# Patient Record
Sex: Female | Born: 1940 | Race: White | Hispanic: No | Marital: Married | Smoking: Never smoker
Health system: Southern US, Community
[De-identification: ages and names within clinical notes are randomized; demographics above are authoritative.]

## PROBLEM LIST (undated history)

## (undated) DIAGNOSIS — T7840XA Allergy, unspecified, initial encounter: Secondary | ICD-10-CM

## (undated) DIAGNOSIS — F419 Anxiety disorder, unspecified: Secondary | ICD-10-CM

## (undated) DIAGNOSIS — I10 Essential (primary) hypertension: Secondary | ICD-10-CM

## (undated) DIAGNOSIS — D649 Anemia, unspecified: Secondary | ICD-10-CM

## (undated) DIAGNOSIS — M1990 Unspecified osteoarthritis, unspecified site: Secondary | ICD-10-CM

## (undated) DIAGNOSIS — K219 Gastro-esophageal reflux disease without esophagitis: Secondary | ICD-10-CM

## (undated) DIAGNOSIS — H269 Unspecified cataract: Secondary | ICD-10-CM

## (undated) DIAGNOSIS — F329 Major depressive disorder, single episode, unspecified: Secondary | ICD-10-CM

## (undated) DIAGNOSIS — F32A Depression, unspecified: Secondary | ICD-10-CM

## (undated) HISTORY — DX: Unspecified cataract: H26.9

## (undated) HISTORY — PX: MOUTH SURGERY: SHX715

## (undated) HISTORY — PX: COLONOSCOPY: SHX174

## (undated) HISTORY — PX: TONSILLECTOMY: SUR1361

## (undated) HISTORY — DX: Anxiety disorder, unspecified: F41.9

## (undated) HISTORY — DX: Major depressive disorder, single episode, unspecified: F32.9

## (undated) HISTORY — DX: Unspecified osteoarthritis, unspecified site: M19.90

## (undated) HISTORY — DX: Anemia, unspecified: D64.9

## (undated) HISTORY — PX: TUBAL LIGATION: SHX77

## (undated) HISTORY — DX: Gastro-esophageal reflux disease without esophagitis: K21.9

## (undated) HISTORY — DX: Allergy, unspecified, initial encounter: T78.40XA

## (undated) HISTORY — DX: Essential (primary) hypertension: I10

## (undated) HISTORY — DX: Depression, unspecified: F32.A

---

## 1998-02-27 ENCOUNTER — Other Ambulatory Visit: Admission: RE | Admit: 1998-02-27 | Discharge: 1998-02-27 | Payer: Self-pay | Admitting: Obstetrics and Gynecology

## 1999-06-25 ENCOUNTER — Other Ambulatory Visit: Admission: RE | Admit: 1999-06-25 | Discharge: 1999-06-25 | Payer: Self-pay | Admitting: Obstetrics and Gynecology

## 2000-06-30 ENCOUNTER — Other Ambulatory Visit: Admission: RE | Admit: 2000-06-30 | Discharge: 2000-06-30 | Payer: Self-pay | Admitting: Obstetrics and Gynecology

## 2001-07-06 ENCOUNTER — Other Ambulatory Visit: Admission: RE | Admit: 2001-07-06 | Discharge: 2001-07-06 | Payer: Self-pay | Admitting: Obstetrics and Gynecology

## 2002-08-27 ENCOUNTER — Other Ambulatory Visit: Admission: RE | Admit: 2002-08-27 | Discharge: 2002-08-27 | Payer: Self-pay | Admitting: Obstetrics and Gynecology

## 2004-06-06 ENCOUNTER — Other Ambulatory Visit: Admission: RE | Admit: 2004-06-06 | Discharge: 2004-06-06 | Payer: Self-pay | Admitting: *Deleted

## 2005-08-09 ENCOUNTER — Other Ambulatory Visit: Admission: RE | Admit: 2005-08-09 | Discharge: 2005-08-09 | Payer: Self-pay | Admitting: Obstetrics and Gynecology

## 2006-08-13 ENCOUNTER — Other Ambulatory Visit: Admission: RE | Admit: 2006-08-13 | Discharge: 2006-08-13 | Payer: Self-pay | Admitting: Obstetrics and Gynecology

## 2007-04-08 ENCOUNTER — Ambulatory Visit: Payer: Self-pay | Admitting: Gastroenterology

## 2007-04-21 ENCOUNTER — Ambulatory Visit: Payer: Self-pay | Admitting: Gastroenterology

## 2007-04-21 ENCOUNTER — Encounter: Payer: Self-pay | Admitting: Gastroenterology

## 2008-01-25 ENCOUNTER — Other Ambulatory Visit: Admission: RE | Admit: 2008-01-25 | Discharge: 2008-01-25 | Payer: Self-pay | Admitting: Obstetrics and Gynecology

## 2011-09-03 DIAGNOSIS — R030 Elevated blood-pressure reading, without diagnosis of hypertension: Secondary | ICD-10-CM | POA: Diagnosis not present

## 2011-09-03 DIAGNOSIS — Z23 Encounter for immunization: Secondary | ICD-10-CM | POA: Diagnosis not present

## 2011-09-03 DIAGNOSIS — M159 Polyosteoarthritis, unspecified: Secondary | ICD-10-CM | POA: Diagnosis not present

## 2011-09-03 DIAGNOSIS — Z79899 Other long term (current) drug therapy: Secondary | ICD-10-CM | POA: Diagnosis not present

## 2011-10-01 DIAGNOSIS — I10 Essential (primary) hypertension: Secondary | ICD-10-CM | POA: Diagnosis not present

## 2011-11-21 DIAGNOSIS — J329 Chronic sinusitis, unspecified: Secondary | ICD-10-CM | POA: Diagnosis not present

## 2011-11-21 DIAGNOSIS — R0989 Other specified symptoms and signs involving the circulatory and respiratory systems: Secondary | ICD-10-CM | POA: Diagnosis not present

## 2011-11-21 DIAGNOSIS — W57XXXA Bitten or stung by nonvenomous insect and other nonvenomous arthropods, initial encounter: Secondary | ICD-10-CM | POA: Diagnosis not present

## 2011-11-21 DIAGNOSIS — R0609 Other forms of dyspnea: Secondary | ICD-10-CM | POA: Diagnosis not present

## 2011-11-21 DIAGNOSIS — T148 Other injury of unspecified body region: Secondary | ICD-10-CM | POA: Diagnosis not present

## 2011-11-21 DIAGNOSIS — J309 Allergic rhinitis, unspecified: Secondary | ICD-10-CM | POA: Diagnosis not present

## 2011-12-02 DIAGNOSIS — I10 Essential (primary) hypertension: Secondary | ICD-10-CM | POA: Diagnosis not present

## 2012-01-23 DIAGNOSIS — Z1231 Encounter for screening mammogram for malignant neoplasm of breast: Secondary | ICD-10-CM | POA: Diagnosis not present

## 2012-01-23 DIAGNOSIS — M949 Disorder of cartilage, unspecified: Secondary | ICD-10-CM | POA: Diagnosis not present

## 2012-01-23 DIAGNOSIS — M899 Disorder of bone, unspecified: Secondary | ICD-10-CM | POA: Diagnosis not present

## 2012-04-22 DIAGNOSIS — Z23 Encounter for immunization: Secondary | ICD-10-CM | POA: Diagnosis not present

## 2012-06-25 DIAGNOSIS — M159 Polyosteoarthritis, unspecified: Secondary | ICD-10-CM | POA: Diagnosis not present

## 2012-06-25 DIAGNOSIS — I10 Essential (primary) hypertension: Secondary | ICD-10-CM | POA: Diagnosis not present

## 2012-06-25 DIAGNOSIS — B009 Herpesviral infection, unspecified: Secondary | ICD-10-CM | POA: Diagnosis not present

## 2012-06-25 DIAGNOSIS — K219 Gastro-esophageal reflux disease without esophagitis: Secondary | ICD-10-CM | POA: Diagnosis not present

## 2012-09-07 DIAGNOSIS — E782 Mixed hyperlipidemia: Secondary | ICD-10-CM | POA: Diagnosis not present

## 2012-09-07 DIAGNOSIS — Z79899 Other long term (current) drug therapy: Secondary | ICD-10-CM | POA: Diagnosis not present

## 2012-09-07 DIAGNOSIS — E559 Vitamin D deficiency, unspecified: Secondary | ICD-10-CM | POA: Diagnosis not present

## 2013-02-22 DIAGNOSIS — M25559 Pain in unspecified hip: Secondary | ICD-10-CM | POA: Diagnosis not present

## 2013-02-22 DIAGNOSIS — Z23 Encounter for immunization: Secondary | ICD-10-CM | POA: Diagnosis not present

## 2013-02-22 DIAGNOSIS — M25569 Pain in unspecified knee: Secondary | ICD-10-CM | POA: Diagnosis not present

## 2013-08-18 DIAGNOSIS — M25559 Pain in unspecified hip: Secondary | ICD-10-CM | POA: Diagnosis not present

## 2013-08-18 DIAGNOSIS — Z23 Encounter for immunization: Secondary | ICD-10-CM | POA: Diagnosis not present

## 2013-08-18 DIAGNOSIS — I10 Essential (primary) hypertension: Secondary | ICD-10-CM | POA: Diagnosis not present

## 2014-01-24 DIAGNOSIS — Z780 Asymptomatic menopausal state: Secondary | ICD-10-CM | POA: Diagnosis not present

## 2014-01-24 DIAGNOSIS — Z1231 Encounter for screening mammogram for malignant neoplasm of breast: Secondary | ICD-10-CM | POA: Diagnosis not present

## 2014-01-24 DIAGNOSIS — Z8262 Family history of osteoporosis: Secondary | ICD-10-CM | POA: Diagnosis not present

## 2014-03-31 DIAGNOSIS — Z23 Encounter for immunization: Secondary | ICD-10-CM | POA: Diagnosis not present

## 2014-10-03 DIAGNOSIS — M159 Polyosteoarthritis, unspecified: Secondary | ICD-10-CM | POA: Diagnosis not present

## 2014-10-03 DIAGNOSIS — Z79899 Other long term (current) drug therapy: Secondary | ICD-10-CM | POA: Diagnosis not present

## 2014-10-03 DIAGNOSIS — K219 Gastro-esophageal reflux disease without esophagitis: Secondary | ICD-10-CM | POA: Diagnosis not present

## 2014-10-03 DIAGNOSIS — B001 Herpesviral vesicular dermatitis: Secondary | ICD-10-CM | POA: Diagnosis not present

## 2014-10-03 DIAGNOSIS — I10 Essential (primary) hypertension: Secondary | ICD-10-CM | POA: Diagnosis not present

## 2014-10-03 DIAGNOSIS — Z0000 Encounter for general adult medical examination without abnormal findings: Secondary | ICD-10-CM | POA: Diagnosis not present

## 2014-10-13 DIAGNOSIS — R0602 Shortness of breath: Secondary | ICD-10-CM | POA: Diagnosis not present

## 2014-12-07 DIAGNOSIS — J40 Bronchitis, not specified as acute or chronic: Secondary | ICD-10-CM | POA: Diagnosis not present

## 2014-12-22 DIAGNOSIS — H109 Unspecified conjunctivitis: Secondary | ICD-10-CM | POA: Diagnosis not present

## 2015-04-25 DIAGNOSIS — B001 Herpesviral vesicular dermatitis: Secondary | ICD-10-CM | POA: Diagnosis not present

## 2015-04-25 DIAGNOSIS — K219 Gastro-esophageal reflux disease without esophagitis: Secondary | ICD-10-CM | POA: Diagnosis not present

## 2015-04-25 DIAGNOSIS — Z0000 Encounter for general adult medical examination without abnormal findings: Secondary | ICD-10-CM | POA: Diagnosis not present

## 2015-04-25 DIAGNOSIS — M159 Polyosteoarthritis, unspecified: Secondary | ICD-10-CM | POA: Diagnosis not present

## 2015-04-25 DIAGNOSIS — I10 Essential (primary) hypertension: Secondary | ICD-10-CM | POA: Diagnosis not present

## 2015-05-02 DIAGNOSIS — Z1231 Encounter for screening mammogram for malignant neoplasm of breast: Secondary | ICD-10-CM | POA: Diagnosis not present

## 2015-06-21 DIAGNOSIS — L821 Other seborrheic keratosis: Secondary | ICD-10-CM | POA: Diagnosis not present

## 2015-06-21 DIAGNOSIS — L570 Actinic keratosis: Secondary | ICD-10-CM | POA: Diagnosis not present

## 2015-06-21 DIAGNOSIS — L300 Nummular dermatitis: Secondary | ICD-10-CM | POA: Diagnosis not present

## 2016-01-24 ENCOUNTER — Other Ambulatory Visit: Payer: Self-pay

## 2016-04-05 DIAGNOSIS — Z0000 Encounter for general adult medical examination without abnormal findings: Secondary | ICD-10-CM | POA: Diagnosis not present

## 2016-04-05 DIAGNOSIS — M159 Polyosteoarthritis, unspecified: Secondary | ICD-10-CM | POA: Diagnosis not present

## 2016-04-05 DIAGNOSIS — Z79899 Other long term (current) drug therapy: Secondary | ICD-10-CM | POA: Diagnosis not present

## 2016-04-05 DIAGNOSIS — M25571 Pain in right ankle and joints of right foot: Secondary | ICD-10-CM | POA: Diagnosis not present

## 2016-04-05 DIAGNOSIS — K219 Gastro-esophageal reflux disease without esophagitis: Secondary | ICD-10-CM | POA: Diagnosis not present

## 2016-04-05 DIAGNOSIS — Z23 Encounter for immunization: Secondary | ICD-10-CM | POA: Diagnosis not present

## 2016-04-05 DIAGNOSIS — I10 Essential (primary) hypertension: Secondary | ICD-10-CM | POA: Diagnosis not present

## 2016-09-02 DIAGNOSIS — M85852 Other specified disorders of bone density and structure, left thigh: Secondary | ICD-10-CM | POA: Diagnosis not present

## 2016-09-02 DIAGNOSIS — Z1231 Encounter for screening mammogram for malignant neoplasm of breast: Secondary | ICD-10-CM | POA: Diagnosis not present

## 2017-03-06 DIAGNOSIS — K219 Gastro-esophageal reflux disease without esophagitis: Secondary | ICD-10-CM | POA: Diagnosis not present

## 2017-03-06 DIAGNOSIS — I10 Essential (primary) hypertension: Secondary | ICD-10-CM | POA: Diagnosis not present

## 2017-03-06 DIAGNOSIS — Z1339 Encounter for screening examination for other mental health and behavioral disorders: Secondary | ICD-10-CM | POA: Diagnosis not present

## 2017-03-06 DIAGNOSIS — M159 Polyosteoarthritis, unspecified: Secondary | ICD-10-CM | POA: Diagnosis not present

## 2017-03-06 DIAGNOSIS — Z79899 Other long term (current) drug therapy: Secondary | ICD-10-CM | POA: Diagnosis not present

## 2017-04-30 ENCOUNTER — Encounter: Payer: Self-pay | Admitting: Gastroenterology

## 2017-07-18 ENCOUNTER — Encounter: Payer: Self-pay | Admitting: Gastroenterology

## 2017-09-08 ENCOUNTER — Other Ambulatory Visit: Payer: Self-pay

## 2017-09-08 ENCOUNTER — Ambulatory Visit (AMBULATORY_SURGERY_CENTER): Payer: Self-pay | Admitting: *Deleted

## 2017-09-08 VITALS — Ht 60.0 in | Wt 168.0 lb

## 2017-09-08 DIAGNOSIS — Z1211 Encounter for screening for malignant neoplasm of colon: Secondary | ICD-10-CM

## 2017-09-08 MED ORDER — NA SULFATE-K SULFATE-MG SULF 17.5-3.13-1.6 GM/177ML PO SOLN: 1.0000 [IU] | Freq: Once | ORAL | 0 refills | Status: AC

## 2017-09-08 NOTE — Progress Notes (Signed)
No egg or soy allergy known to patient  No issues with past sedation with any surgeries  or procedures, no intubation problems  No diet pills per patient No home 02 use per patient  No blood thinners per patient  Pt denies issues with constipation  No A fib or A flutter  EMMI video sent to pt's e mail  

## 2017-09-17 ENCOUNTER — Telehealth: Payer: Self-pay | Admitting: Gastroenterology

## 2017-09-17 NOTE — Telephone Encounter (Signed)
Pt called to inform that she usually eats gluten and lactose free foods because her stomach feels better. She has never been tested for gluten intolerance but would like Dr. Fuller Plan to know about this.

## 2017-09-17 NOTE — Telephone Encounter (Signed)
I advised the patient I will pass this along to Dr. Fuller Plan.  She is scheduled for a colonoscopy on Monday.  She just wanted Dr. Fuller Plan to be aware

## 2017-09-22 ENCOUNTER — Encounter: Payer: Self-pay | Admitting: Gastroenterology

## 2017-09-22 ENCOUNTER — Ambulatory Visit (AMBULATORY_SURGERY_CENTER): Payer: Medicare Other | Admitting: Gastroenterology

## 2017-09-22 ENCOUNTER — Other Ambulatory Visit: Payer: Self-pay

## 2017-09-22 VITALS — BP 110/56 | HR 64 | Temp 96.8°F | Resp 18 | Ht 60.0 in | Wt 168.0 lb

## 2017-09-22 DIAGNOSIS — Z1212 Encounter for screening for malignant neoplasm of rectum: Secondary | ICD-10-CM | POA: Diagnosis not present

## 2017-09-22 DIAGNOSIS — K219 Gastro-esophageal reflux disease without esophagitis: Secondary | ICD-10-CM | POA: Diagnosis not present

## 2017-09-22 DIAGNOSIS — Z1211 Encounter for screening for malignant neoplasm of colon: Secondary | ICD-10-CM

## 2017-09-22 DIAGNOSIS — I10 Essential (primary) hypertension: Secondary | ICD-10-CM | POA: Diagnosis not present

## 2017-09-22 MED ORDER — SODIUM CHLORIDE 0.9 % IV SOLN: 500.0000 mL | Freq: Once | INTRAVENOUS | Status: DC

## 2017-09-22 NOTE — Progress Notes (Signed)
Pt's states no medical or surgical changes since previsit or office visit. 

## 2017-09-22 NOTE — Patient Instructions (Signed)
HANDOUTS GIVEN FOR HEMORRHOIDS AND DIVERTICULOSIS  YOU HAD AN ENDOSCOPIC PROCEDURE TODAY AT [facility]:   Refer to the procedure report that was given to you for any specific questions about what was found during the examination.  If the procedure report does not answer your questions, please call your gastroenterologist to clarify.  If you requested that your care partner not be given the details of your procedure findings, then the procedure report has been included in a sealed envelope for you to review at your convenience later.  YOU SHOULD EXPECT: Some feelings of bloating in the abdomen. Passage of more gas than usual.  Walking can help get rid of the air that was put into your GI tract during the procedure and reduce the bloating. If you had a lower endoscopy (such as a colonoscopy or flexible sigmoidoscopy) you may notice spotting of blood in your stool or on the toilet paper. If you underwent a bowel prep for your procedure, you may not have a normal bowel movement for a few days.  Please Note:  You might notice some irritation and congestion in your nose or some drainage.  This is from the oxygen used during your procedure.  There is no need for concern and it should clear up in a day or so.  SYMPTOMS TO REPORT IMMEDIATELY:   Following lower endoscopy (colonoscopy or flexible sigmoidoscopy):  Excessive amounts of blood in the stool  Significant tenderness or worsening of abdominal pains  Swelling of the abdomen that is new, acute  Fever of 100F or higher  For urgent or emergent issues, a gastroenterologist can be reached at any hour by calling [phone].   DIET:  We do recommend a small meal at first, but then you may proceed to your regular diet.  Drink plenty of fluids but you should avoid alcoholic beverages for 24 hours.  ACTIVITY:  You should plan to take it easy for the rest of today and you should NOT DRIVE or use heavy machinery until tomorrow (because  of the sedation medicines used during the test).    FOLLOW UP: Our staff will call the number listed on your records the next business day following your procedure to check on you and address any questions or concerns that you may have regarding the information given to you following your procedure. If we do not reach you, we will leave a message.  However, if you are feeling well and you are not experiencing any problems, there is no need to return our call.  We will assume that you have returned to your regular daily activities without incident.  If any biopsies were taken you will be contacted by phone or by letter within the next 1-3 weeks.  Please call us at [phone] if you have not heard about the biopsies in 3 weeks.    SIGNATURES/CONFIDENTIALITY: You and/or your care partner have signed paperwork which will be entered into your electronic medical record.  These signatures attest to the fact that that the information above on your After Visit Summary has been reviewed and is understood.  Full responsibility of the confidentiality of this discharge information lies with you and/or your care-partner.

## 2017-09-22 NOTE — Op Note (Signed)
[place] Patient Name: Connie Jarvis Procedure Date: 09/22/2017 11:18 AM MRN: [id number] Endoscopist: Ladene Artist , MD Age: 77 Referring MD:  Date of Birth: Dec 05, 1940 Gender: Female Account #: [id number] Procedure:                Colonoscopy Indications:              Screening for colorectal malignant neoplasm Medicines:                Monitored Anesthesia Care Procedure:                Pre-Anesthesia Assessment:                           - Prior to the procedure, a History and Physical                            was performed, and patient medications and                            allergies were reviewed. The patient's tolerance of                            previous anesthesia was also reviewed. The risks                            and benefits of the procedure and the sedation                            options and risks were discussed with the patient.                            All questions were answered, and informed consent                            was obtained. Prior Anticoagulants: The patient has                            taken no previous anticoagulant or antiplatelet                            agents. ASA Grade Assessment: II - A patient with                            mild systemic disease. After reviewing the risks                            and benefits, the patient was deemed in                            satisfactory condition to undergo the procedure.                           After obtaining informed consent, the colonoscope  was passed under direct vision. Throughout the                            procedure, the patient's blood pressure, pulse, and                            oxygen saturations were monitored continuously. The                            Model PCF-[id number]) scope was introduced                            through the anus and advanced to the the cecum,                            identified by  appendiceal orifice and ileocecal                            valve. The ileocecal valve, appendiceal orifice,                            and rectum were photographed. The quality of the                            bowel preparation was good. The colonoscopy was                            performed without difficulty. The patient tolerated                            the procedure well. Scope In: 11:31:50 AM Scope Out: 11:43:38 AM Scope Withdrawal Time: 0 hours 8 minutes 42 seconds  Total Procedure Duration: 0 hours 11 minutes 48 seconds  Findings:                 The perianal and digital rectal examinations were                            normal.                           A few medium-mouthed diverticula were found in the                            left colon. There was no evidence of diverticular                            bleeding.                           Internal hemorrhoids were found during                            retroflexion. The hemorrhoids were mild and Grade I                            (  internal hemorrhoids that do not prolapse).                           The exam was otherwise without abnormality on                            direct and retroflexion views. Complications:            No immediate complications. Estimated blood loss:                            None. Estimated Blood Loss:     Estimated blood loss: none. Impression:               - Mild diverticulosis in the left colon. There was                            no evidence of diverticular bleeding.                           - Internal hemorrhoids.                           - The examination was otherwise normal on direct                            and retroflexion views.                           - No specimens collected. Recommendation:           - Patient has a contact number available for                            emergencies. The signs and symptoms of potential                            delayed complications  were discussed with the                            patient. Return to normal activities tomorrow.                            Written discharge instructions were provided to the                            patient.                           - Resume previous diet.                           - Continue present medications.                           - No repeat colonoscopy due to age. Ladene Artist, MD 09/22/2017 11:47:23 AM This report has been signed electronically.

## 2017-09-22 NOTE — Progress Notes (Signed)
Report to PACU, RN, vss, BBS= Clear.  

## 2017-09-23 ENCOUNTER — Telehealth: Payer: Self-pay | Admitting: *Deleted

## 2017-09-23 NOTE — Telephone Encounter (Signed)
  Follow up Call-  Call back number 09/22/2017  Post procedure Call Back phone  # [phone]  Permission to leave phone message Yes  Some recent data might be hidden     Patient questions:  Do you have a fever, pain , or abdominal swelling? No. Pain Score  0 *  Have you tolerated food without any problems? Yes.    Have you been able to return to your normal activities? Yes.    Do you have any questions about your discharge instructions: Diet   No. Medications  No. Follow up visit  No.  Do you have questions or concerns about your Care? No.  Actions: * If pain score is 4 or above: No action needed, pain <4.

## 2017-10-08 DIAGNOSIS — J22 Unspecified acute lower respiratory infection: Secondary | ICD-10-CM | POA: Diagnosis not present

## 2017-10-08 DIAGNOSIS — Z6832 Body mass index (BMI) 32.0-32.9, adult: Secondary | ICD-10-CM | POA: Diagnosis not present

## 2017-10-08 DIAGNOSIS — R9389 Abnormal findings on diagnostic imaging of other specified body structures: Secondary | ICD-10-CM | POA: Diagnosis not present

## 2017-10-13 DIAGNOSIS — I700 Atherosclerosis of aorta: Secondary | ICD-10-CM | POA: Diagnosis not present

## 2017-10-13 DIAGNOSIS — R9389 Abnormal findings on diagnostic imaging of other specified body structures: Secondary | ICD-10-CM | POA: Diagnosis not present

## 2017-10-13 DIAGNOSIS — J439 Emphysema, unspecified: Secondary | ICD-10-CM | POA: Diagnosis not present

## 2017-10-23 DIAGNOSIS — I509 Heart failure, unspecified: Secondary | ICD-10-CM | POA: Diagnosis not present

## 2017-10-23 DIAGNOSIS — I493 Ventricular premature depolarization: Secondary | ICD-10-CM | POA: Diagnosis not present

## 2017-11-05 DIAGNOSIS — I509 Heart failure, unspecified: Secondary | ICD-10-CM | POA: Diagnosis not present

## 2017-11-12 DIAGNOSIS — J4540 Moderate persistent asthma, uncomplicated: Secondary | ICD-10-CM | POA: Diagnosis not present

## 2017-11-12 DIAGNOSIS — R5383 Other fatigue: Secondary | ICD-10-CM | POA: Diagnosis not present

## 2017-11-12 DIAGNOSIS — E559 Vitamin D deficiency, unspecified: Secondary | ICD-10-CM | POA: Diagnosis not present

## 2017-11-12 DIAGNOSIS — E8801 Alpha-1-antitrypsin deficiency: Secondary | ICD-10-CM | POA: Diagnosis not present

## 2017-12-01 DIAGNOSIS — I270 Primary pulmonary hypertension: Secondary | ICD-10-CM | POA: Diagnosis not present

## 2017-12-01 DIAGNOSIS — R5383 Other fatigue: Secondary | ICD-10-CM | POA: Diagnosis not present

## 2017-12-01 DIAGNOSIS — J4540 Moderate persistent asthma, uncomplicated: Secondary | ICD-10-CM | POA: Diagnosis not present

## 2017-12-04 DIAGNOSIS — R0602 Shortness of breath: Secondary | ICD-10-CM | POA: Diagnosis not present

## 2017-12-12 DIAGNOSIS — I700 Atherosclerosis of aorta: Secondary | ICD-10-CM | POA: Diagnosis not present

## 2017-12-12 DIAGNOSIS — R0602 Shortness of breath: Secondary | ICD-10-CM | POA: Diagnosis not present

## 2017-12-12 DIAGNOSIS — I272 Pulmonary hypertension, unspecified: Secondary | ICD-10-CM | POA: Diagnosis not present

## 2017-12-12 DIAGNOSIS — I270 Primary pulmonary hypertension: Secondary | ICD-10-CM | POA: Diagnosis not present

## 2017-12-12 DIAGNOSIS — R0609 Other forms of dyspnea: Secondary | ICD-10-CM | POA: Diagnosis not present

## 2017-12-12 DIAGNOSIS — J45909 Unspecified asthma, uncomplicated: Secondary | ICD-10-CM | POA: Diagnosis not present

## 2017-12-25 DIAGNOSIS — R0602 Shortness of breath: Secondary | ICD-10-CM | POA: Diagnosis not present

## 2017-12-29 DIAGNOSIS — J4540 Moderate persistent asthma, uncomplicated: Secondary | ICD-10-CM | POA: Diagnosis not present

## 2017-12-29 DIAGNOSIS — I270 Primary pulmonary hypertension: Secondary | ICD-10-CM | POA: Diagnosis not present

## 2017-12-29 DIAGNOSIS — R5383 Other fatigue: Secondary | ICD-10-CM | POA: Diagnosis not present

## 2018-01-27 DIAGNOSIS — R5383 Other fatigue: Secondary | ICD-10-CM | POA: Diagnosis not present

## 2018-01-27 DIAGNOSIS — I270 Primary pulmonary hypertension: Secondary | ICD-10-CM | POA: Diagnosis not present

## 2018-01-27 DIAGNOSIS — J4540 Moderate persistent asthma, uncomplicated: Secondary | ICD-10-CM | POA: Diagnosis not present

## 2018-03-10 DIAGNOSIS — Z23 Encounter for immunization: Secondary | ICD-10-CM | POA: Diagnosis not present

## 2018-03-31 DIAGNOSIS — J4540 Moderate persistent asthma, uncomplicated: Secondary | ICD-10-CM | POA: Diagnosis not present

## 2018-03-31 DIAGNOSIS — I270 Primary pulmonary hypertension: Secondary | ICD-10-CM | POA: Diagnosis not present

## 2018-03-31 DIAGNOSIS — R5383 Other fatigue: Secondary | ICD-10-CM | POA: Diagnosis not present

## 2018-04-10 ENCOUNTER — Other Ambulatory Visit: Payer: Self-pay

## 2018-05-06 DIAGNOSIS — Z1231 Encounter for screening mammogram for malignant neoplasm of breast: Secondary | ICD-10-CM | POA: Diagnosis not present

## 2018-05-08 DIAGNOSIS — J4540 Moderate persistent asthma, uncomplicated: Secondary | ICD-10-CM | POA: Diagnosis not present

## 2018-05-08 DIAGNOSIS — R5383 Other fatigue: Secondary | ICD-10-CM | POA: Diagnosis not present

## 2018-05-08 DIAGNOSIS — I270 Primary pulmonary hypertension: Secondary | ICD-10-CM | POA: Diagnosis not present

## 2018-05-11 DIAGNOSIS — R0602 Shortness of breath: Secondary | ICD-10-CM | POA: Diagnosis not present

## 2018-05-21 DIAGNOSIS — Z0000 Encounter for general adult medical examination without abnormal findings: Secondary | ICD-10-CM | POA: Diagnosis not present

## 2018-05-21 DIAGNOSIS — Z79899 Other long term (current) drug therapy: Secondary | ICD-10-CM | POA: Diagnosis not present

## 2018-05-21 DIAGNOSIS — K219 Gastro-esophageal reflux disease without esophagitis: Secondary | ICD-10-CM | POA: Diagnosis not present

## 2018-05-21 DIAGNOSIS — I10 Essential (primary) hypertension: Secondary | ICD-10-CM | POA: Diagnosis not present

## 2018-05-21 DIAGNOSIS — Z6832 Body mass index (BMI) 32.0-32.9, adult: Secondary | ICD-10-CM | POA: Diagnosis not present

## 2018-06-10 DIAGNOSIS — R5383 Other fatigue: Secondary | ICD-10-CM | POA: Diagnosis not present

## 2018-06-10 DIAGNOSIS — I270 Primary pulmonary hypertension: Secondary | ICD-10-CM | POA: Diagnosis not present

## 2018-06-10 DIAGNOSIS — J4540 Moderate persistent asthma, uncomplicated: Secondary | ICD-10-CM | POA: Diagnosis not present

## 2018-06-26 DIAGNOSIS — J45909 Unspecified asthma, uncomplicated: Secondary | ICD-10-CM | POA: Diagnosis not present

## 2018-06-26 DIAGNOSIS — I272 Pulmonary hypertension, unspecified: Secondary | ICD-10-CM | POA: Diagnosis not present

## 2018-07-29 DIAGNOSIS — I272 Pulmonary hypertension, unspecified: Secondary | ICD-10-CM | POA: Diagnosis not present

## 2018-07-29 DIAGNOSIS — J45909 Unspecified asthma, uncomplicated: Secondary | ICD-10-CM | POA: Diagnosis not present

## 2018-07-29 DIAGNOSIS — J984 Other disorders of lung: Secondary | ICD-10-CM | POA: Diagnosis not present

## 2018-07-29 DIAGNOSIS — R0600 Dyspnea, unspecified: Secondary | ICD-10-CM | POA: Diagnosis not present

## 2018-07-29 DIAGNOSIS — J449 Chronic obstructive pulmonary disease, unspecified: Secondary | ICD-10-CM | POA: Diagnosis not present

## 2018-07-31 DIAGNOSIS — I270 Primary pulmonary hypertension: Secondary | ICD-10-CM | POA: Diagnosis not present

## 2018-07-31 DIAGNOSIS — J4540 Moderate persistent asthma, uncomplicated: Secondary | ICD-10-CM | POA: Diagnosis not present

## 2018-07-31 DIAGNOSIS — R5383 Other fatigue: Secondary | ICD-10-CM | POA: Diagnosis not present

## 2018-08-07 DIAGNOSIS — R0602 Shortness of breath: Secondary | ICD-10-CM | POA: Diagnosis not present

## 2018-08-07 DIAGNOSIS — J45909 Unspecified asthma, uncomplicated: Secondary | ICD-10-CM | POA: Diagnosis not present

## 2018-08-07 DIAGNOSIS — I272 Pulmonary hypertension, unspecified: Secondary | ICD-10-CM | POA: Diagnosis not present

## 2018-09-23 DIAGNOSIS — R5383 Other fatigue: Secondary | ICD-10-CM | POA: Diagnosis not present

## 2018-09-23 DIAGNOSIS — J4540 Moderate persistent asthma, uncomplicated: Secondary | ICD-10-CM | POA: Diagnosis not present

## 2018-09-23 DIAGNOSIS — G4733 Obstructive sleep apnea (adult) (pediatric): Secondary | ICD-10-CM | POA: Diagnosis not present

## 2018-09-23 DIAGNOSIS — G2581 Restless legs syndrome: Secondary | ICD-10-CM | POA: Diagnosis not present

## 2018-10-28 DIAGNOSIS — J4540 Moderate persistent asthma, uncomplicated: Secondary | ICD-10-CM | POA: Diagnosis not present

## 2018-10-28 DIAGNOSIS — R5383 Other fatigue: Secondary | ICD-10-CM | POA: Diagnosis not present

## 2018-10-28 DIAGNOSIS — G4733 Obstructive sleep apnea (adult) (pediatric): Secondary | ICD-10-CM | POA: Diagnosis not present

## 2018-10-28 DIAGNOSIS — G2581 Restless legs syndrome: Secondary | ICD-10-CM | POA: Diagnosis not present

## 2018-11-05 DIAGNOSIS — J45909 Unspecified asthma, uncomplicated: Secondary | ICD-10-CM | POA: Diagnosis not present

## 2018-11-05 DIAGNOSIS — I272 Pulmonary hypertension, unspecified: Secondary | ICD-10-CM | POA: Diagnosis not present

## 2018-11-20 DIAGNOSIS — J45909 Unspecified asthma, uncomplicated: Secondary | ICD-10-CM | POA: Diagnosis not present

## 2018-11-20 DIAGNOSIS — I351 Nonrheumatic aortic (valve) insufficiency: Secondary | ICD-10-CM | POA: Diagnosis not present

## 2018-11-20 DIAGNOSIS — I340 Nonrheumatic mitral (valve) insufficiency: Secondary | ICD-10-CM | POA: Diagnosis not present

## 2018-11-20 DIAGNOSIS — I272 Pulmonary hypertension, unspecified: Secondary | ICD-10-CM | POA: Diagnosis not present

## 2018-12-09 DIAGNOSIS — L570 Actinic keratosis: Secondary | ICD-10-CM | POA: Diagnosis not present

## 2018-12-09 DIAGNOSIS — C44329 Squamous cell carcinoma of skin of other parts of face: Secondary | ICD-10-CM | POA: Diagnosis not present

## 2018-12-09 DIAGNOSIS — Z85828 Personal history of other malignant neoplasm of skin: Secondary | ICD-10-CM | POA: Diagnosis not present

## 2018-12-09 DIAGNOSIS — L309 Dermatitis, unspecified: Secondary | ICD-10-CM | POA: Diagnosis not present

## 2019-01-31 DIAGNOSIS — Z9981 Dependence on supplemental oxygen: Secondary | ICD-10-CM | POA: Diagnosis not present

## 2019-01-31 DIAGNOSIS — R069 Unspecified abnormalities of breathing: Secondary | ICD-10-CM | POA: Diagnosis not present

## 2019-01-31 DIAGNOSIS — Z7982 Long term (current) use of aspirin: Secondary | ICD-10-CM | POA: Diagnosis not present

## 2019-01-31 DIAGNOSIS — R05 Cough: Secondary | ICD-10-CM | POA: Diagnosis not present

## 2019-01-31 DIAGNOSIS — R7989 Other specified abnormal findings of blood chemistry: Secondary | ICD-10-CM | POA: Diagnosis not present

## 2019-01-31 DIAGNOSIS — J181 Lobar pneumonia, unspecified organism: Secondary | ICD-10-CM | POA: Diagnosis not present

## 2019-01-31 DIAGNOSIS — J189 Pneumonia, unspecified organism: Secondary | ICD-10-CM | POA: Diagnosis not present

## 2019-01-31 DIAGNOSIS — J45909 Unspecified asthma, uncomplicated: Secondary | ICD-10-CM | POA: Diagnosis not present

## 2019-01-31 DIAGNOSIS — M1990 Unspecified osteoarthritis, unspecified site: Secondary | ICD-10-CM | POA: Diagnosis present

## 2019-01-31 DIAGNOSIS — Z03818 Encounter for observation for suspected exposure to other biological agents ruled out: Secondary | ICD-10-CM | POA: Diagnosis not present

## 2019-01-31 DIAGNOSIS — R0902 Hypoxemia: Secondary | ICD-10-CM | POA: Diagnosis not present

## 2019-01-31 DIAGNOSIS — J9690 Respiratory failure, unspecified, unspecified whether with hypoxia or hypercapnia: Secondary | ICD-10-CM | POA: Diagnosis not present

## 2019-01-31 DIAGNOSIS — J90 Pleural effusion, not elsewhere classified: Secondary | ICD-10-CM | POA: Diagnosis not present

## 2019-01-31 DIAGNOSIS — J9620 Acute and chronic respiratory failure, unspecified whether with hypoxia or hypercapnia: Secondary | ICD-10-CM | POA: Diagnosis not present

## 2019-01-31 DIAGNOSIS — I2721 Secondary pulmonary arterial hypertension: Secondary | ICD-10-CM | POA: Diagnosis not present

## 2019-01-31 DIAGNOSIS — Z79899 Other long term (current) drug therapy: Secondary | ICD-10-CM | POA: Diagnosis not present

## 2019-01-31 DIAGNOSIS — I491 Atrial premature depolarization: Secondary | ICD-10-CM | POA: Diagnosis not present

## 2019-01-31 DIAGNOSIS — R0689 Other abnormalities of breathing: Secondary | ICD-10-CM | POA: Diagnosis not present

## 2019-01-31 DIAGNOSIS — K219 Gastro-esophageal reflux disease without esophagitis: Secondary | ICD-10-CM | POA: Diagnosis present

## 2019-01-31 DIAGNOSIS — Z209 Contact with and (suspected) exposure to unspecified communicable disease: Secondary | ICD-10-CM | POA: Diagnosis not present

## 2019-01-31 DIAGNOSIS — R0602 Shortness of breath: Secondary | ICD-10-CM | POA: Diagnosis not present

## 2019-02-01 DIAGNOSIS — J9620 Acute and chronic respiratory failure, unspecified whether with hypoxia or hypercapnia: Secondary | ICD-10-CM | POA: Diagnosis not present

## 2019-02-01 DIAGNOSIS — J45909 Unspecified asthma, uncomplicated: Secondary | ICD-10-CM | POA: Diagnosis not present

## 2019-02-01 DIAGNOSIS — J189 Pneumonia, unspecified organism: Secondary | ICD-10-CM | POA: Diagnosis not present

## 2019-02-04 DIAGNOSIS — J45909 Unspecified asthma, uncomplicated: Secondary | ICD-10-CM | POA: Diagnosis not present

## 2019-02-04 DIAGNOSIS — Z7952 Long term (current) use of systemic steroids: Secondary | ICD-10-CM | POA: Diagnosis not present

## 2019-02-04 DIAGNOSIS — Z9981 Dependence on supplemental oxygen: Secondary | ICD-10-CM | POA: Diagnosis not present

## 2019-02-04 DIAGNOSIS — Z79899 Other long term (current) drug therapy: Secondary | ICD-10-CM | POA: Diagnosis not present

## 2019-02-04 DIAGNOSIS — M1990 Unspecified osteoarthritis, unspecified site: Secondary | ICD-10-CM | POA: Diagnosis not present

## 2019-02-04 DIAGNOSIS — Z791 Long term (current) use of non-steroidal anti-inflammatories (NSAID): Secondary | ICD-10-CM | POA: Diagnosis not present

## 2019-02-04 DIAGNOSIS — I2721 Secondary pulmonary arterial hypertension: Secondary | ICD-10-CM | POA: Diagnosis not present

## 2019-02-04 DIAGNOSIS — J189 Pneumonia, unspecified organism: Secondary | ICD-10-CM | POA: Diagnosis not present

## 2019-02-04 DIAGNOSIS — K219 Gastro-esophageal reflux disease without esophagitis: Secondary | ICD-10-CM | POA: Diagnosis not present

## 2019-02-04 DIAGNOSIS — J9620 Acute and chronic respiratory failure, unspecified whether with hypoxia or hypercapnia: Secondary | ICD-10-CM | POA: Diagnosis not present

## 2019-02-04 DIAGNOSIS — Z7982 Long term (current) use of aspirin: Secondary | ICD-10-CM | POA: Diagnosis not present

## 2019-02-04 DIAGNOSIS — Z792 Long term (current) use of antibiotics: Secondary | ICD-10-CM | POA: Diagnosis not present

## 2019-02-04 DIAGNOSIS — J90 Pleural effusion, not elsewhere classified: Secondary | ICD-10-CM | POA: Diagnosis not present

## 2019-02-09 DIAGNOSIS — Z23 Encounter for immunization: Secondary | ICD-10-CM | POA: Diagnosis not present

## 2019-02-09 DIAGNOSIS — Z9181 History of falling: Secondary | ICD-10-CM | POA: Diagnosis not present

## 2019-02-09 DIAGNOSIS — Z1331 Encounter for screening for depression: Secondary | ICD-10-CM | POA: Diagnosis not present

## 2019-02-09 DIAGNOSIS — Z6833 Body mass index (BMI) 33.0-33.9, adult: Secondary | ICD-10-CM | POA: Diagnosis not present

## 2019-02-09 DIAGNOSIS — J189 Pneumonia, unspecified organism: Secondary | ICD-10-CM | POA: Diagnosis not present

## 2019-02-10 DIAGNOSIS — J45909 Unspecified asthma, uncomplicated: Secondary | ICD-10-CM | POA: Diagnosis not present

## 2019-02-10 DIAGNOSIS — K219 Gastro-esophageal reflux disease without esophagitis: Secondary | ICD-10-CM | POA: Diagnosis not present

## 2019-02-10 DIAGNOSIS — J90 Pleural effusion, not elsewhere classified: Secondary | ICD-10-CM | POA: Diagnosis not present

## 2019-02-10 DIAGNOSIS — I2721 Secondary pulmonary arterial hypertension: Secondary | ICD-10-CM | POA: Diagnosis not present

## 2019-02-10 DIAGNOSIS — J189 Pneumonia, unspecified organism: Secondary | ICD-10-CM | POA: Diagnosis not present

## 2019-02-10 DIAGNOSIS — J9620 Acute and chronic respiratory failure, unspecified whether with hypoxia or hypercapnia: Secondary | ICD-10-CM | POA: Diagnosis not present

## 2019-02-12 DIAGNOSIS — I2721 Secondary pulmonary arterial hypertension: Secondary | ICD-10-CM | POA: Diagnosis not present

## 2019-02-12 DIAGNOSIS — J9620 Acute and chronic respiratory failure, unspecified whether with hypoxia or hypercapnia: Secondary | ICD-10-CM | POA: Diagnosis not present

## 2019-02-12 DIAGNOSIS — J189 Pneumonia, unspecified organism: Secondary | ICD-10-CM | POA: Diagnosis not present

## 2019-02-12 DIAGNOSIS — J45909 Unspecified asthma, uncomplicated: Secondary | ICD-10-CM | POA: Diagnosis not present

## 2019-02-12 DIAGNOSIS — K219 Gastro-esophageal reflux disease without esophagitis: Secondary | ICD-10-CM | POA: Diagnosis not present

## 2019-02-12 DIAGNOSIS — J90 Pleural effusion, not elsewhere classified: Secondary | ICD-10-CM | POA: Diagnosis not present

## 2019-02-16 DIAGNOSIS — I2721 Secondary pulmonary arterial hypertension: Secondary | ICD-10-CM | POA: Diagnosis not present

## 2019-02-16 DIAGNOSIS — J45909 Unspecified asthma, uncomplicated: Secondary | ICD-10-CM | POA: Diagnosis not present

## 2019-02-16 DIAGNOSIS — J9620 Acute and chronic respiratory failure, unspecified whether with hypoxia or hypercapnia: Secondary | ICD-10-CM | POA: Diagnosis not present

## 2019-02-16 DIAGNOSIS — K219 Gastro-esophageal reflux disease without esophagitis: Secondary | ICD-10-CM | POA: Diagnosis not present

## 2019-02-16 DIAGNOSIS — J90 Pleural effusion, not elsewhere classified: Secondary | ICD-10-CM | POA: Diagnosis not present

## 2019-02-16 DIAGNOSIS — J189 Pneumonia, unspecified organism: Secondary | ICD-10-CM | POA: Diagnosis not present

## 2019-02-17 DIAGNOSIS — J189 Pneumonia, unspecified organism: Secondary | ICD-10-CM | POA: Diagnosis not present

## 2019-02-18 DIAGNOSIS — J4540 Moderate persistent asthma, uncomplicated: Secondary | ICD-10-CM | POA: Diagnosis not present

## 2019-02-18 DIAGNOSIS — I270 Primary pulmonary hypertension: Secondary | ICD-10-CM | POA: Diagnosis not present

## 2019-02-23 DIAGNOSIS — I340 Nonrheumatic mitral (valve) insufficiency: Secondary | ICD-10-CM | POA: Diagnosis not present

## 2019-02-23 DIAGNOSIS — I351 Nonrheumatic aortic (valve) insufficiency: Secondary | ICD-10-CM | POA: Diagnosis not present

## 2019-02-23 DIAGNOSIS — R000 Tachycardia, unspecified: Secondary | ICD-10-CM | POA: Diagnosis not present

## 2019-02-23 DIAGNOSIS — I272 Pulmonary hypertension, unspecified: Secondary | ICD-10-CM | POA: Diagnosis not present

## 2019-02-25 DIAGNOSIS — I2721 Secondary pulmonary arterial hypertension: Secondary | ICD-10-CM | POA: Diagnosis not present

## 2019-02-25 DIAGNOSIS — J45909 Unspecified asthma, uncomplicated: Secondary | ICD-10-CM | POA: Diagnosis not present

## 2019-02-25 DIAGNOSIS — J189 Pneumonia, unspecified organism: Secondary | ICD-10-CM | POA: Diagnosis not present

## 2019-02-25 DIAGNOSIS — K219 Gastro-esophageal reflux disease without esophagitis: Secondary | ICD-10-CM | POA: Diagnosis not present

## 2019-02-25 DIAGNOSIS — J90 Pleural effusion, not elsewhere classified: Secondary | ICD-10-CM | POA: Diagnosis not present

## 2019-02-25 DIAGNOSIS — J9620 Acute and chronic respiratory failure, unspecified whether with hypoxia or hypercapnia: Secondary | ICD-10-CM | POA: Diagnosis not present

## 2019-03-02 DIAGNOSIS — I272 Pulmonary hypertension, unspecified: Secondary | ICD-10-CM | POA: Diagnosis not present

## 2019-03-02 DIAGNOSIS — R000 Tachycardia, unspecified: Secondary | ICD-10-CM | POA: Diagnosis not present

## 2019-03-02 DIAGNOSIS — I351 Nonrheumatic aortic (valve) insufficiency: Secondary | ICD-10-CM | POA: Diagnosis not present

## 2019-03-02 DIAGNOSIS — I340 Nonrheumatic mitral (valve) insufficiency: Secondary | ICD-10-CM | POA: Diagnosis not present

## 2019-03-04 DIAGNOSIS — I351 Nonrheumatic aortic (valve) insufficiency: Secondary | ICD-10-CM | POA: Diagnosis not present

## 2019-03-04 DIAGNOSIS — J189 Pneumonia, unspecified organism: Secondary | ICD-10-CM | POA: Diagnosis not present

## 2019-03-04 DIAGNOSIS — J9620 Acute and chronic respiratory failure, unspecified whether with hypoxia or hypercapnia: Secondary | ICD-10-CM | POA: Diagnosis not present

## 2019-03-04 DIAGNOSIS — I340 Nonrheumatic mitral (valve) insufficiency: Secondary | ICD-10-CM | POA: Diagnosis not present

## 2019-03-04 DIAGNOSIS — J90 Pleural effusion, not elsewhere classified: Secondary | ICD-10-CM | POA: Diagnosis not present

## 2019-03-04 DIAGNOSIS — I2721 Secondary pulmonary arterial hypertension: Secondary | ICD-10-CM | POA: Diagnosis not present

## 2019-03-04 DIAGNOSIS — K219 Gastro-esophageal reflux disease without esophagitis: Secondary | ICD-10-CM | POA: Diagnosis not present

## 2019-03-04 DIAGNOSIS — J45909 Unspecified asthma, uncomplicated: Secondary | ICD-10-CM | POA: Diagnosis not present

## 2019-03-04 DIAGNOSIS — I272 Pulmonary hypertension, unspecified: Secondary | ICD-10-CM | POA: Diagnosis not present

## 2019-03-04 DIAGNOSIS — R000 Tachycardia, unspecified: Secondary | ICD-10-CM | POA: Diagnosis not present

## 2019-03-06 DIAGNOSIS — J9620 Acute and chronic respiratory failure, unspecified whether with hypoxia or hypercapnia: Secondary | ICD-10-CM | POA: Diagnosis not present

## 2019-03-06 DIAGNOSIS — Z791 Long term (current) use of non-steroidal anti-inflammatories (NSAID): Secondary | ICD-10-CM | POA: Diagnosis not present

## 2019-03-06 DIAGNOSIS — J90 Pleural effusion, not elsewhere classified: Secondary | ICD-10-CM | POA: Diagnosis not present

## 2019-03-06 DIAGNOSIS — Z79899 Other long term (current) drug therapy: Secondary | ICD-10-CM | POA: Diagnosis not present

## 2019-03-06 DIAGNOSIS — J189 Pneumonia, unspecified organism: Secondary | ICD-10-CM | POA: Diagnosis not present

## 2019-03-06 DIAGNOSIS — Z7952 Long term (current) use of systemic steroids: Secondary | ICD-10-CM | POA: Diagnosis not present

## 2019-03-06 DIAGNOSIS — I2721 Secondary pulmonary arterial hypertension: Secondary | ICD-10-CM | POA: Diagnosis not present

## 2019-03-06 DIAGNOSIS — Z9981 Dependence on supplemental oxygen: Secondary | ICD-10-CM | POA: Diagnosis not present

## 2019-03-06 DIAGNOSIS — K219 Gastro-esophageal reflux disease without esophagitis: Secondary | ICD-10-CM | POA: Diagnosis not present

## 2019-03-06 DIAGNOSIS — M1990 Unspecified osteoarthritis, unspecified site: Secondary | ICD-10-CM | POA: Diagnosis not present

## 2019-03-06 DIAGNOSIS — Z7982 Long term (current) use of aspirin: Secondary | ICD-10-CM | POA: Diagnosis not present

## 2019-03-06 DIAGNOSIS — Z792 Long term (current) use of antibiotics: Secondary | ICD-10-CM | POA: Diagnosis not present

## 2019-03-06 DIAGNOSIS — J45909 Unspecified asthma, uncomplicated: Secondary | ICD-10-CM | POA: Diagnosis not present

## 2019-03-12 DIAGNOSIS — I270 Primary pulmonary hypertension: Secondary | ICD-10-CM | POA: Diagnosis not present

## 2019-03-12 DIAGNOSIS — J4540 Moderate persistent asthma, uncomplicated: Secondary | ICD-10-CM | POA: Diagnosis not present

## 2019-03-18 DIAGNOSIS — J189 Pneumonia, unspecified organism: Secondary | ICD-10-CM | POA: Diagnosis not present

## 2019-03-18 DIAGNOSIS — J90 Pleural effusion, not elsewhere classified: Secondary | ICD-10-CM | POA: Diagnosis not present

## 2019-03-18 DIAGNOSIS — K219 Gastro-esophageal reflux disease without esophagitis: Secondary | ICD-10-CM | POA: Diagnosis not present

## 2019-03-18 DIAGNOSIS — J45909 Unspecified asthma, uncomplicated: Secondary | ICD-10-CM | POA: Diagnosis not present

## 2019-03-18 DIAGNOSIS — I2721 Secondary pulmonary arterial hypertension: Secondary | ICD-10-CM | POA: Diagnosis not present

## 2019-03-18 DIAGNOSIS — J9620 Acute and chronic respiratory failure, unspecified whether with hypoxia or hypercapnia: Secondary | ICD-10-CM | POA: Diagnosis not present

## 2019-03-29 DIAGNOSIS — J189 Pneumonia, unspecified organism: Secondary | ICD-10-CM | POA: Diagnosis not present

## 2019-03-29 DIAGNOSIS — K219 Gastro-esophageal reflux disease without esophagitis: Secondary | ICD-10-CM | POA: Diagnosis not present

## 2019-03-29 DIAGNOSIS — J9620 Acute and chronic respiratory failure, unspecified whether with hypoxia or hypercapnia: Secondary | ICD-10-CM | POA: Diagnosis not present

## 2019-03-29 DIAGNOSIS — J45909 Unspecified asthma, uncomplicated: Secondary | ICD-10-CM | POA: Diagnosis not present

## 2019-03-29 DIAGNOSIS — I2721 Secondary pulmonary arterial hypertension: Secondary | ICD-10-CM | POA: Diagnosis not present

## 2019-03-29 DIAGNOSIS — J90 Pleural effusion, not elsewhere classified: Secondary | ICD-10-CM | POA: Diagnosis not present

## 2019-03-30 DIAGNOSIS — I340 Nonrheumatic mitral (valve) insufficiency: Secondary | ICD-10-CM | POA: Diagnosis not present

## 2019-03-30 DIAGNOSIS — Z7982 Long term (current) use of aspirin: Secondary | ICD-10-CM | POA: Diagnosis not present

## 2019-03-30 DIAGNOSIS — R000 Tachycardia, unspecified: Secondary | ICD-10-CM | POA: Diagnosis not present

## 2019-03-30 DIAGNOSIS — I272 Pulmonary hypertension, unspecified: Secondary | ICD-10-CM | POA: Diagnosis not present

## 2019-03-30 DIAGNOSIS — I351 Nonrheumatic aortic (valve) insufficiency: Secondary | ICD-10-CM | POA: Diagnosis not present

## 2019-03-30 DIAGNOSIS — I493 Ventricular premature depolarization: Secondary | ICD-10-CM | POA: Diagnosis not present

## 2019-04-02 DIAGNOSIS — J40 Bronchitis, not specified as acute or chronic: Secondary | ICD-10-CM | POA: Diagnosis not present

## 2019-04-05 DIAGNOSIS — Z792 Long term (current) use of antibiotics: Secondary | ICD-10-CM | POA: Diagnosis not present

## 2019-04-05 DIAGNOSIS — M1990 Unspecified osteoarthritis, unspecified site: Secondary | ICD-10-CM | POA: Diagnosis not present

## 2019-04-05 DIAGNOSIS — J9620 Acute and chronic respiratory failure, unspecified whether with hypoxia or hypercapnia: Secondary | ICD-10-CM | POA: Diagnosis not present

## 2019-04-05 DIAGNOSIS — Z7952 Long term (current) use of systemic steroids: Secondary | ICD-10-CM | POA: Diagnosis not present

## 2019-04-05 DIAGNOSIS — Z79899 Other long term (current) drug therapy: Secondary | ICD-10-CM | POA: Diagnosis not present

## 2019-04-05 DIAGNOSIS — Z791 Long term (current) use of non-steroidal anti-inflammatories (NSAID): Secondary | ICD-10-CM | POA: Diagnosis not present

## 2019-04-05 DIAGNOSIS — J45909 Unspecified asthma, uncomplicated: Secondary | ICD-10-CM | POA: Diagnosis not present

## 2019-04-05 DIAGNOSIS — Z9981 Dependence on supplemental oxygen: Secondary | ICD-10-CM | POA: Diagnosis not present

## 2019-04-05 DIAGNOSIS — K219 Gastro-esophageal reflux disease without esophagitis: Secondary | ICD-10-CM | POA: Diagnosis not present

## 2019-04-05 DIAGNOSIS — J90 Pleural effusion, not elsewhere classified: Secondary | ICD-10-CM | POA: Diagnosis not present

## 2019-04-05 DIAGNOSIS — I493 Ventricular premature depolarization: Secondary | ICD-10-CM | POA: Diagnosis not present

## 2019-04-05 DIAGNOSIS — Z7982 Long term (current) use of aspirin: Secondary | ICD-10-CM | POA: Diagnosis not present

## 2019-04-05 DIAGNOSIS — I2721 Secondary pulmonary arterial hypertension: Secondary | ICD-10-CM | POA: Diagnosis not present

## 2019-04-21 DIAGNOSIS — I270 Primary pulmonary hypertension: Secondary | ICD-10-CM | POA: Diagnosis not present

## 2019-04-21 DIAGNOSIS — J4540 Moderate persistent asthma, uncomplicated: Secondary | ICD-10-CM | POA: Diagnosis not present

## 2019-04-29 DIAGNOSIS — I272 Pulmonary hypertension, unspecified: Secondary | ICD-10-CM | POA: Diagnosis not present

## 2019-04-29 DIAGNOSIS — Z7982 Long term (current) use of aspirin: Secondary | ICD-10-CM | POA: Diagnosis not present

## 2019-04-29 DIAGNOSIS — J9620 Acute and chronic respiratory failure, unspecified whether with hypoxia or hypercapnia: Secondary | ICD-10-CM | POA: Diagnosis not present

## 2019-04-29 DIAGNOSIS — I351 Nonrheumatic aortic (valve) insufficiency: Secondary | ICD-10-CM | POA: Diagnosis not present

## 2019-04-29 DIAGNOSIS — R000 Tachycardia, unspecified: Secondary | ICD-10-CM | POA: Diagnosis not present

## 2019-04-29 DIAGNOSIS — I340 Nonrheumatic mitral (valve) insufficiency: Secondary | ICD-10-CM | POA: Diagnosis not present

## 2019-04-29 DIAGNOSIS — I493 Ventricular premature depolarization: Secondary | ICD-10-CM | POA: Diagnosis not present

## 2019-04-30 DIAGNOSIS — J90 Pleural effusion, not elsewhere classified: Secondary | ICD-10-CM | POA: Diagnosis not present

## 2019-04-30 DIAGNOSIS — I2721 Secondary pulmonary arterial hypertension: Secondary | ICD-10-CM | POA: Diagnosis not present

## 2019-04-30 DIAGNOSIS — J45909 Unspecified asthma, uncomplicated: Secondary | ICD-10-CM | POA: Diagnosis not present

## 2019-04-30 DIAGNOSIS — M1990 Unspecified osteoarthritis, unspecified site: Secondary | ICD-10-CM | POA: Diagnosis not present

## 2019-04-30 DIAGNOSIS — K219 Gastro-esophageal reflux disease without esophagitis: Secondary | ICD-10-CM | POA: Diagnosis not present

## 2019-04-30 DIAGNOSIS — J9620 Acute and chronic respiratory failure, unspecified whether with hypoxia or hypercapnia: Secondary | ICD-10-CM | POA: Diagnosis not present

## 2019-05-05 DIAGNOSIS — J90 Pleural effusion, not elsewhere classified: Secondary | ICD-10-CM | POA: Diagnosis not present

## 2019-05-05 DIAGNOSIS — Z791 Long term (current) use of non-steroidal anti-inflammatories (NSAID): Secondary | ICD-10-CM | POA: Diagnosis not present

## 2019-05-05 DIAGNOSIS — Z7982 Long term (current) use of aspirin: Secondary | ICD-10-CM | POA: Diagnosis not present

## 2019-05-05 DIAGNOSIS — J9620 Acute and chronic respiratory failure, unspecified whether with hypoxia or hypercapnia: Secondary | ICD-10-CM | POA: Diagnosis not present

## 2019-05-05 DIAGNOSIS — Z79899 Other long term (current) drug therapy: Secondary | ICD-10-CM | POA: Diagnosis not present

## 2019-05-05 DIAGNOSIS — J45909 Unspecified asthma, uncomplicated: Secondary | ICD-10-CM | POA: Diagnosis not present

## 2019-05-05 DIAGNOSIS — M1990 Unspecified osteoarthritis, unspecified site: Secondary | ICD-10-CM | POA: Diagnosis not present

## 2019-05-05 DIAGNOSIS — K219 Gastro-esophageal reflux disease without esophagitis: Secondary | ICD-10-CM | POA: Diagnosis not present

## 2019-05-05 DIAGNOSIS — Z9981 Dependence on supplemental oxygen: Secondary | ICD-10-CM | POA: Diagnosis not present

## 2019-05-05 DIAGNOSIS — Z7952 Long term (current) use of systemic steroids: Secondary | ICD-10-CM | POA: Diagnosis not present

## 2019-05-05 DIAGNOSIS — Z792 Long term (current) use of antibiotics: Secondary | ICD-10-CM | POA: Diagnosis not present

## 2019-05-05 DIAGNOSIS — I2721 Secondary pulmonary arterial hypertension: Secondary | ICD-10-CM | POA: Diagnosis not present

## 2019-05-12 DIAGNOSIS — K219 Gastro-esophageal reflux disease without esophagitis: Secondary | ICD-10-CM | POA: Diagnosis not present

## 2019-05-12 DIAGNOSIS — J90 Pleural effusion, not elsewhere classified: Secondary | ICD-10-CM | POA: Diagnosis not present

## 2019-05-12 DIAGNOSIS — I2721 Secondary pulmonary arterial hypertension: Secondary | ICD-10-CM | POA: Diagnosis not present

## 2019-05-12 DIAGNOSIS — J45909 Unspecified asthma, uncomplicated: Secondary | ICD-10-CM | POA: Diagnosis not present

## 2019-05-12 DIAGNOSIS — M1990 Unspecified osteoarthritis, unspecified site: Secondary | ICD-10-CM | POA: Diagnosis not present

## 2019-05-12 DIAGNOSIS — J9620 Acute and chronic respiratory failure, unspecified whether with hypoxia or hypercapnia: Secondary | ICD-10-CM | POA: Diagnosis not present

## 2019-05-14 DIAGNOSIS — J40 Bronchitis, not specified as acute or chronic: Secondary | ICD-10-CM | POA: Diagnosis not present

## 2019-05-14 DIAGNOSIS — I270 Primary pulmonary hypertension: Secondary | ICD-10-CM | POA: Diagnosis not present

## 2019-05-14 DIAGNOSIS — J329 Chronic sinusitis, unspecified: Secondary | ICD-10-CM | POA: Diagnosis not present

## 2019-05-14 DIAGNOSIS — J45909 Unspecified asthma, uncomplicated: Secondary | ICD-10-CM | POA: Diagnosis not present

## 2019-05-31 DIAGNOSIS — R5383 Other fatigue: Secondary | ICD-10-CM | POA: Diagnosis not present

## 2019-05-31 DIAGNOSIS — I270 Primary pulmonary hypertension: Secondary | ICD-10-CM | POA: Diagnosis not present

## 2019-06-03 DIAGNOSIS — J9620 Acute and chronic respiratory failure, unspecified whether with hypoxia or hypercapnia: Secondary | ICD-10-CM | POA: Diagnosis not present

## 2019-06-03 DIAGNOSIS — I2721 Secondary pulmonary arterial hypertension: Secondary | ICD-10-CM | POA: Diagnosis not present

## 2019-06-03 DIAGNOSIS — M1990 Unspecified osteoarthritis, unspecified site: Secondary | ICD-10-CM | POA: Diagnosis not present

## 2019-06-03 DIAGNOSIS — J90 Pleural effusion, not elsewhere classified: Secondary | ICD-10-CM | POA: Diagnosis not present

## 2019-06-03 DIAGNOSIS — J45909 Unspecified asthma, uncomplicated: Secondary | ICD-10-CM | POA: Diagnosis not present

## 2019-06-03 DIAGNOSIS — K219 Gastro-esophageal reflux disease without esophagitis: Secondary | ICD-10-CM | POA: Diagnosis not present

## 2019-07-01 DIAGNOSIS — Z6833 Body mass index (BMI) 33.0-33.9, adult: Secondary | ICD-10-CM | POA: Diagnosis not present

## 2019-07-01 DIAGNOSIS — L02229 Furuncle of trunk, unspecified: Secondary | ICD-10-CM | POA: Diagnosis not present

## 2019-07-02 DIAGNOSIS — L02211 Cutaneous abscess of abdominal wall: Secondary | ICD-10-CM | POA: Diagnosis not present

## 2019-07-07 ENCOUNTER — Inpatient Hospital Stay (HOSPITAL_COMMUNITY)
Admission: AD | Admit: 2019-07-07 | Discharge: 2019-07-24 | DRG: 163 | Disposition: A | Discharge status: Rehabilitation Facility | Payer: Medicare Other | Source: Other Acute Inpatient Hospital | Attending: Cardiothoracic Surgery | Admitting: Cardiothoracic Surgery

## 2019-07-07 ENCOUNTER — Other Ambulatory Visit: Payer: Self-pay

## 2019-07-07 ENCOUNTER — Encounter (HOSPITAL_COMMUNITY): Payer: Self-pay | Admitting: Internal Medicine

## 2019-07-07 DIAGNOSIS — I10 Essential (primary) hypertension: Secondary | ICD-10-CM | POA: Diagnosis not present

## 2019-07-07 DIAGNOSIS — R490 Dysphonia: Secondary | ICD-10-CM | POA: Diagnosis present

## 2019-07-07 DIAGNOSIS — K219 Gastro-esophageal reflux disease without esophagitis: Secondary | ICD-10-CM

## 2019-07-07 DIAGNOSIS — F419 Anxiety disorder, unspecified: Secondary | ICD-10-CM | POA: Diagnosis present

## 2019-07-07 DIAGNOSIS — L7632 Postprocedural hematoma of skin and subcutaneous tissue following other procedure: Secondary | ICD-10-CM | POA: Diagnosis not present

## 2019-07-07 DIAGNOSIS — D62 Acute posthemorrhagic anemia: Secondary | ICD-10-CM | POA: Diagnosis not present

## 2019-07-07 DIAGNOSIS — Z978 Presence of other specified devices: Secondary | ICD-10-CM

## 2019-07-07 DIAGNOSIS — I272 Pulmonary hypertension, unspecified: Secondary | ICD-10-CM | POA: Diagnosis not present

## 2019-07-07 DIAGNOSIS — E876 Hypokalemia: Secondary | ICD-10-CM | POA: Diagnosis present

## 2019-07-07 DIAGNOSIS — R1115 Cyclical vomiting syndrome unrelated to migraine: Secondary | ICD-10-CM

## 2019-07-07 DIAGNOSIS — I96 Gangrene, not elsewhere classified: Secondary | ICD-10-CM | POA: Diagnosis present

## 2019-07-07 DIAGNOSIS — I2721 Secondary pulmonary arterial hypertension: Secondary | ICD-10-CM | POA: Diagnosis present

## 2019-07-07 DIAGNOSIS — L02211 Cutaneous abscess of abdominal wall: Secondary | ICD-10-CM | POA: Diagnosis not present

## 2019-07-07 DIAGNOSIS — Z79899 Other long term (current) drug therapy: Secondary | ICD-10-CM

## 2019-07-07 DIAGNOSIS — Z66 Do not resuscitate: Secondary | ICD-10-CM | POA: Diagnosis not present

## 2019-07-07 DIAGNOSIS — J9621 Acute and chronic respiratory failure with hypoxia: Secondary | ICD-10-CM | POA: Diagnosis present

## 2019-07-07 DIAGNOSIS — R918 Other nonspecific abnormal finding of lung field: Secondary | ICD-10-CM | POA: Diagnosis not present

## 2019-07-07 DIAGNOSIS — B955 Unspecified streptococcus as the cause of diseases classified elsewhere: Secondary | ICD-10-CM | POA: Diagnosis present

## 2019-07-07 DIAGNOSIS — J90 Pleural effusion, not elsewhere classified: Secondary | ICD-10-CM

## 2019-07-07 DIAGNOSIS — M1990 Unspecified osteoarthritis, unspecified site: Secondary | ICD-10-CM | POA: Diagnosis present

## 2019-07-07 DIAGNOSIS — E8770 Fluid overload, unspecified: Secondary | ICD-10-CM | POA: Diagnosis not present

## 2019-07-07 DIAGNOSIS — R112 Nausea with vomiting, unspecified: Secondary | ICD-10-CM | POA: Diagnosis not present

## 2019-07-07 DIAGNOSIS — Z9981 Dependence on supplemental oxygen: Secondary | ICD-10-CM | POA: Diagnosis not present

## 2019-07-07 DIAGNOSIS — Z883 Allergy status to other anti-infective agents status: Secondary | ICD-10-CM | POA: Diagnosis not present

## 2019-07-07 DIAGNOSIS — J939 Pneumothorax, unspecified: Secondary | ICD-10-CM

## 2019-07-07 DIAGNOSIS — Z09 Encounter for follow-up examination after completed treatment for conditions other than malignant neoplasm: Secondary | ICD-10-CM

## 2019-07-07 DIAGNOSIS — Z20822 Contact with and (suspected) exposure to covid-19: Secondary | ICD-10-CM | POA: Diagnosis present

## 2019-07-07 DIAGNOSIS — J449 Chronic obstructive pulmonary disease, unspecified: Secondary | ICD-10-CM | POA: Diagnosis not present

## 2019-07-07 DIAGNOSIS — K5730 Diverticulosis of large intestine without perforation or abscess without bleeding: Secondary | ICD-10-CM | POA: Diagnosis present

## 2019-07-07 DIAGNOSIS — L02213 Cutaneous abscess of chest wall: Secondary | ICD-10-CM | POA: Diagnosis present

## 2019-07-07 DIAGNOSIS — Z6826 Body mass index (BMI) 26.0-26.9, adult: Secondary | ICD-10-CM | POA: Diagnosis not present

## 2019-07-07 DIAGNOSIS — K8020 Calculus of gallbladder without cholecystitis without obstruction: Secondary | ICD-10-CM | POA: Diagnosis not present

## 2019-07-07 DIAGNOSIS — Z91018 Allergy to other foods: Secondary | ICD-10-CM | POA: Diagnosis not present

## 2019-07-07 DIAGNOSIS — Z8249 Family history of ischemic heart disease and other diseases of the circulatory system: Secondary | ICD-10-CM

## 2019-07-07 DIAGNOSIS — Z7951 Long term (current) use of inhaled steroids: Secondary | ICD-10-CM | POA: Diagnosis not present

## 2019-07-07 DIAGNOSIS — M7989 Other specified soft tissue disorders: Secondary | ICD-10-CM | POA: Diagnosis not present

## 2019-07-07 DIAGNOSIS — Y838 Other surgical procedures as the cause of abnormal reaction of the patient, or of later complication, without mention of misadventure at the time of the procedure: Secondary | ICD-10-CM | POA: Diagnosis not present

## 2019-07-07 DIAGNOSIS — E440 Moderate protein-calorie malnutrition: Secondary | ICD-10-CM | POA: Insufficient documentation

## 2019-07-07 DIAGNOSIS — E43 Unspecified severe protein-calorie malnutrition: Secondary | ICD-10-CM | POA: Diagnosis present

## 2019-07-07 DIAGNOSIS — L89322 Pressure ulcer of left buttock, stage 2: Secondary | ICD-10-CM | POA: Diagnosis not present

## 2019-07-07 DIAGNOSIS — Z9689 Presence of other specified functional implants: Secondary | ICD-10-CM

## 2019-07-07 DIAGNOSIS — F329 Major depressive disorder, single episode, unspecified: Secondary | ICD-10-CM | POA: Diagnosis present

## 2019-07-07 DIAGNOSIS — J9811 Atelectasis: Secondary | ICD-10-CM | POA: Diagnosis present

## 2019-07-07 DIAGNOSIS — K8080 Other cholelithiasis without obstruction: Secondary | ICD-10-CM | POA: Diagnosis not present

## 2019-07-07 DIAGNOSIS — I482 Chronic atrial fibrillation, unspecified: Secondary | ICD-10-CM | POA: Diagnosis present

## 2019-07-07 DIAGNOSIS — I5031 Acute diastolic (congestive) heart failure: Secondary | ICD-10-CM | POA: Diagnosis present

## 2019-07-07 DIAGNOSIS — J918 Pleural effusion in other conditions classified elsewhere: Secondary | ICD-10-CM | POA: Diagnosis present

## 2019-07-07 DIAGNOSIS — M8618 Other acute osteomyelitis, other site: Secondary | ICD-10-CM | POA: Diagnosis not present

## 2019-07-07 DIAGNOSIS — I480 Paroxysmal atrial fibrillation: Secondary | ICD-10-CM | POA: Diagnosis present

## 2019-07-07 DIAGNOSIS — Z01810 Encounter for preprocedural cardiovascular examination: Secondary | ICD-10-CM | POA: Diagnosis not present

## 2019-07-07 DIAGNOSIS — J439 Emphysema, unspecified: Secondary | ICD-10-CM | POA: Diagnosis not present

## 2019-07-07 DIAGNOSIS — L089 Local infection of the skin and subcutaneous tissue, unspecified: Secondary | ICD-10-CM | POA: Diagnosis not present

## 2019-07-07 DIAGNOSIS — J869 Pyothorax without fistula: Secondary | ICD-10-CM | POA: Diagnosis present

## 2019-07-07 DIAGNOSIS — L8990 Pressure ulcer of unspecified site, unspecified stage: Secondary | ICD-10-CM | POA: Insufficient documentation

## 2019-07-07 DIAGNOSIS — Z885 Allergy status to narcotic agent status: Secondary | ICD-10-CM | POA: Diagnosis not present

## 2019-07-07 DIAGNOSIS — Z6833 Body mass index (BMI) 33.0-33.9, adult: Secondary | ICD-10-CM

## 2019-07-07 DIAGNOSIS — R5381 Other malaise: Secondary | ICD-10-CM | POA: Diagnosis not present

## 2019-07-07 DIAGNOSIS — Z91011 Allergy to milk products: Secondary | ICD-10-CM | POA: Diagnosis not present

## 2019-07-07 DIAGNOSIS — R0902 Hypoxemia: Secondary | ICD-10-CM | POA: Diagnosis present

## 2019-07-07 DIAGNOSIS — I110 Hypertensive heart disease with heart failure: Secondary | ICD-10-CM | POA: Diagnosis present

## 2019-07-07 DIAGNOSIS — Z7982 Long term (current) use of aspirin: Secondary | ICD-10-CM | POA: Diagnosis not present

## 2019-07-07 DIAGNOSIS — R188 Other ascites: Secondary | ICD-10-CM | POA: Diagnosis not present

## 2019-07-07 DIAGNOSIS — Z4682 Encounter for fitting and adjustment of non-vascular catheter: Secondary | ICD-10-CM | POA: Diagnosis not present

## 2019-07-07 DIAGNOSIS — I083 Combined rheumatic disorders of mitral, aortic and tricuspid valves: Secondary | ICD-10-CM | POA: Diagnosis present

## 2019-07-07 DIAGNOSIS — D649 Anemia, unspecified: Secondary | ICD-10-CM | POA: Diagnosis not present

## 2019-07-07 DIAGNOSIS — Z888 Allergy status to other drugs, medicaments and biological substances status: Secondary | ICD-10-CM | POA: Diagnosis not present

## 2019-07-07 DIAGNOSIS — J410 Simple chronic bronchitis: Secondary | ICD-10-CM | POA: Diagnosis not present

## 2019-07-07 DIAGNOSIS — R197 Diarrhea, unspecified: Secondary | ICD-10-CM | POA: Diagnosis not present

## 2019-07-07 LAB — CBC
HCT: 28.5 % — ABNORMAL LOW (ref 36.0–46.0)
Hemoglobin: 8.9 g/dL — ABNORMAL LOW (ref 12.0–15.0)
MCH: 27 pg (ref 26.0–34.0)
MCHC: 31.2 g/dL (ref 30.0–36.0)
MCV: 86.4 fL (ref 80.0–100.0)
Platelets: 254 10*3/uL (ref 150–400)
RBC: 3.3 MIL/uL — ABNORMAL LOW (ref 3.87–5.11)
RDW: 16.9 % — ABNORMAL HIGH (ref 11.5–15.5)
WBC: 6.2 10*3/uL (ref 4.0–10.5)
nRBC: 0 % (ref 0.0–0.2)

## 2019-07-07 LAB — CREATININE, SERUM
Creatinine, Ser: 0.53 mg/dL (ref 0.44–1.00)
GFR calc Af Amer: >60 mL/min (ref >60)
GFR calc non Af Amer: >60 mL/min (ref >60)

## 2019-07-07 MED ORDER — CELECOXIB 200 MG PO CAPS
200.0000 mg | ORAL_CAPSULE | Freq: Two times a day (BID) | ORAL | Status: DC
  Filled 2019-07-07 (×4): qty 1

## 2019-07-07 MED ORDER — PIPERACILLIN-TAZOBACTAM 3.375 G IVPB
3.3750 g | Freq: Three times a day (TID) | INTRAVENOUS | Status: AC
  Administered 2019-07-07 – 2019-07-10 (×9): 3.375 g via INTRAVENOUS
  Filled 2019-07-07 (×11): qty 50

## 2019-07-07 MED ORDER — MONTELUKAST SODIUM 10 MG PO TABS: 10.0000 mg | ORAL_TABLET | Freq: Every day | ORAL | Status: DC

## 2019-07-07 MED ORDER — ENOXAPARIN SODIUM 40 MG/0.4ML ~~LOC~~ SOLN
40.0000 mg | SUBCUTANEOUS | Status: DC
  Administered 2019-07-07 – 2019-07-08 (×2): 40 mg via SUBCUTANEOUS
  Filled 2019-07-07 (×2): qty 0.4

## 2019-07-07 MED ORDER — SODIUM CHLORIDE 0.45 % IV SOLN
INTRAVENOUS | Status: DC
  Administered 2019-07-13: 14:00:00 50 mL/h via INTRAVENOUS

## 2019-07-07 MED ORDER — VANCOMYCIN HCL IN DEXTROSE 1-5 GM/200ML-% IV SOLN
1000.0000 mg | INTRAVENOUS | Status: DC
  Administered 2019-07-08 – 2019-07-13 (×6): 1000 mg via INTRAVENOUS
  Filled 2019-07-07 (×8): qty 200

## 2019-07-07 MED ORDER — SODIUM CHLORIDE 0.9% FLUSH
10.0000 mL | INTRAVENOUS | Status: DC | PRN
  Administered 2019-07-20: 10 mL

## 2019-07-07 MED ORDER — DOCUSATE SODIUM 100 MG PO CAPS
100.0000 mg | ORAL_CAPSULE | Freq: Two times a day (BID) | ORAL | Status: DC
  Administered 2019-07-07: 100 mg via ORAL
  Filled 2019-07-07 (×2): qty 1

## 2019-07-07 MED ORDER — MOMETASONE FURO-FORMOTEROL FUM 100-5 MCG/ACT IN AERO
2.0000 | INHALATION_SPRAY | Freq: Two times a day (BID) | RESPIRATORY_TRACT | Status: DC
  Administered 2019-07-08 – 2019-07-24 (×26): 2 via RESPIRATORY_TRACT
  Filled 2019-07-07 (×3): qty 8.8

## 2019-07-07 MED ORDER — METOPROLOL SUCCINATE ER 25 MG PO TB24
12.5000 mg | ORAL_TABLET | Freq: Every day | ORAL | Status: DC
  Administered 2019-07-08: 12.5 mg via ORAL
  Filled 2019-07-07: qty 1

## 2019-07-07 MED ORDER — TRAZODONE HCL 50 MG PO TABS: 25.0000 mg | ORAL_TABLET | Freq: Every evening | ORAL | Status: DC | PRN

## 2019-07-07 MED ORDER — PANTOPRAZOLE SODIUM 40 MG PO TBEC: 40.0000 mg | DELAYED_RELEASE_TABLET | Freq: Every day | ORAL | Status: DC

## 2019-07-07 MED ORDER — KETOROLAC TROMETHAMINE 15 MG/ML IJ SOLN
15.0000 mg | Freq: Four times a day (QID) | INTRAMUSCULAR | Status: AC | PRN
  Administered 2019-07-09 – 2019-07-10 (×2): 15 mg via INTRAVENOUS
  Filled 2019-07-07 (×2): qty 1

## 2019-07-07 MED ORDER — VANCOMYCIN HCL 1500 MG/300ML IV SOLN
1500.0000 mg | Freq: Once | INTRAVENOUS | Status: AC
  Administered 2019-07-07: 1500 mg via INTRAVENOUS
  Filled 2019-07-07: qty 300

## 2019-07-07 MED ORDER — ACETAMINOPHEN 500 MG PO TABS: 500.0000 mg | ORAL_TABLET | Freq: Four times a day (QID) | ORAL | Status: DC | PRN

## 2019-07-07 MED ORDER — LOSARTAN POTASSIUM 25 MG PO TABS: 25.0000 mg | ORAL_TABLET | Freq: Every day | ORAL | Status: DC

## 2019-07-07 NOTE — H&P (Addendum)
History and Physical    Connie Jarvis [id number] DOB: 17-Feb-1941 DOA: 07/07/2019  PCP: Ronita Hipps, MD (Confirm with patient/family/NH records and if not entered, this has to be entered at [facility] point of entry) Patient coming from: Transfer from [employer] [facility], [place] from home  I have personally briefly reviewed patient's old medical records in [place]  Chief Complaint: large empyema  HPI: Connie Jarvis is a 79 y.o. female with medical history significant of O2 dependent COPD. Recently seen by GS for left upper chest wall lesion. S/p I&D and placed on Abx. Patient was to have further debridement of lesion but c/o SOB.  Seen in f/u by pulmonology. Patient found to have a large fungating mass, 8x20cm draining yellow fluid. CT scan of the chest revealed a large empyema measuring 13.4x12.5 cm with compressive atelectasis. GS recommended transfer to tertiary care. Case discussed with Dr. Orvan Seen of CT surgery. Patient transferred to [facility] for further surgical treatment and continued medical treatment. Dr. Orvan Seen to be consulted on arrival. Pt to be NPO /p MN. Patient was started on Vanc and Zosyn for abx cocverage.  [facility] hospital lab reviewed: notable for normal CBC, Bmet was normal with Cr 0.50. BNP elevated at 7,660. EKG revealed A. Fib at rate of 73. Additonal incidental findings on CT chest: xcholelithiasis, sigmoid diverticulosis, gastric wall thickening suggestive of chronic gastritis, right pleural effusion. CXR was not revealing and pulmonary edema was NOT diagnosed.   (For level 3, the HPI must include 4+ descriptors: Location, Quality, Severity, Duration, Timing, Context, modifying factors, associated signs/symptoms and/or status of 3+ chronic problems.)  (Please avoid self-populating past medical history here) (The initial 2-3 lines should be focused and good to copy and paste in the HPI section of the daily progress note).  ED Course: patient not seen in ED  Review of Systems: As per HPI  otherwise 10 point review of systems negative.  Unacceptable ROS statements: "10 systems reviewed," "Extensive" (without elaboration).  Acceptable ROS statements: "All others negative," "All others reviewed and are negative," and "All others unremarkable," with at [place] documented Can't double dip - if using for HPI can't use for ROS  Past Medical History:  Diagnosis Date  . Allergy   . Anemia   . Anxiety   . Arthritis   . Cataract    pt. not sure which eye early stage  . Depression   . GERD (gastroesophageal reflux disease)   . Hypertension     Past Surgical History:  Procedure Laterality Date  . COLONOSCOPY    . MOUTH SURGERY    . TONSILLECTOMY    . TUBAL LIGATION     Social Hx  Married 39 year, 3 children, 1 grandchild. 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 Husband with some forgetfullness. They live independently   reports that she has never smoked. She has never used smokeless tobacco. She reports that she does not drink alcohol or use drugs.  Allergies  Allergen Reactions  . Codeine Nausea And Vomiting  . Gluten Meal Diarrhea    Abdominal pain and bloating  . Lactose Diarrhea    Bloating and abdominal pain  . Neosporin [Neomycin-Bacitracin Zn-Polymyx]   . Other     Pain medication pt. Cannot remember the name.    Family History  Problem Relation Age of Onset  . Colon cancer Neg Hx   . Colon polyps Neg Hx   . Esophageal cancer Neg Hx   .  Pancreatic cancer Neg Hx   . Rectal cancer Neg Hx   . Stomach cancer Neg Hx   grandmother wth HTN Father with HTN  Unacceptable: Noncontributory, unremarkable, or negative. Acceptable: Family history reviewed and not pertinent (If you reviewed it)  Prior to Admission medications   Medication Sig Start Date End Date Taking? Authorizing Provider  acetaminophen (TYLENOL) 500 MG tablet Take 500 mg by mouth every 6 (six) hours as needed.    [provider]  aspirin EC 81 MG tablet Take  81 mg by mouth daily.    [provider]  b complex vitamins capsule Take 1 capsule by mouth daily.    [provider]  budesonide-formoterol (SYMBICORT) 80-4.5 MCG/ACT inhaler Inhale 2 puffs into the lungs 2 (two) times daily. 06/03/19   [provider]  Calcium Carbonate-Vitamin D (CALTRATE 600+D) 600-400 MG-UNIT tablet Take 1 tablet by mouth daily.    [provider]  celecoxib (CELEBREX) 200 MG capsule Take 200 mg by mouth 2 (two) times daily. 09/02/17   [provider]  Cholecalciferol (VITAMIN D3) 10000 units TABS Take 1 tablet by mouth 2 (two) times daily.    [provider]  clindamycin (CLEOCIN) 300 MG capsule Take 300 mg by mouth 2 (two) times daily. 07/01/19   [provider]  Glucosamine-Chondroit-Vit C-Mn (GLUCOSAMINE-CHONDROITIN MAX ST) CAPS Take 1 capsule by mouth 2 (two) times daily.    [provider]  losartan (COZAAR) 25 MG tablet Take 25 mg by mouth daily. 07/17/17   [provider]  metoprolol succinate (TOPROL-XL) 25 MG 24 hr tablet Take 12.5 mg by mouth daily. 06/26/19   [provider]  montelukast (SINGULAIR) 10 MG tablet Take 10 mg by mouth daily. 06/28/19   [provider]  Multiple Vitamin (MULTIVITAMIN) tablet Take 1 tablet by mouth daily.    [provider]  Omega-3 Fatty Acids (FISH OIL) 1000 MG CAPS Take 1 capsule by mouth daily.    [provider]  omeprazole (PRILOSEC) 20 MG capsule Take 20 mg by mouth daily.    [provider]  valACYclovir (VALTREX) 1000 MG tablet Take 1,000 mg by mouth as needed.    [provider]    Physical Exam: Vitals:   07/07/19 1826 07/07/19 1837  BP: 118/74 118/74  Pulse: (!) 116 (!) 116  Resp:  19  Temp: 97.6 F (36.4 C) 97.6 F (36.4 C)  TempSrc: Oral Oral  SpO2: 100%   Weight: 71.6 kg 71.6 kg  Height: 5' (1.524 m) 5' (1.524 m)    Constitutional: NAD, calm, comfortable Vitals:   07/07/19 1826  07/07/19 1837  BP: 118/74 118/74  Pulse: (!) 116 (!) 116  Resp:  19  Temp: 97.6 F (36.4 C) 97.6 F (36.4 C)  TempSrc: Oral Oral  SpO2: 100%   Weight: 71.6 kg 71.6 kg  Height: 5' (1.524 m) 5' (1.524 m)   Eyes: PERRL, lids and conjunctivae normal ENMT: Mucous membranes are moist. Posterior pharynx clear of any exudate or lesions.Normal dentition.  Neck: normal, supple, no masses, no thyromegaly Respiratory:No BS left 2/3 way up back, no wheezing, no crackles. Normal respiratory effort. No accessory muscle use.  Cardiovascular: IRIR, no murmurs / rubs / gallops. 2+ LE extremity edema to below the knee. trace pedal pulses. No carotid bruits. 4cm JVD Abdomen: no tenderness, no masses palpated. No hepatosplenomegaly. Bowel sounds positive.  Musculoskeletal: no clubbing / cyanosis. No joint deformity upper and lower extremities. Good ROM, no contractures. Normal  muscle tone.  Skin: no rashes,  ulcers. No induration. Open wound left chest with dressing in place, absent left great toe nail Neurologic: CN 2-12 grossly intact. Sensation intact, Strength 4/5 in all 4.  Psychiatric: Normal judgment and insight. Alert and oriented x 3. Normal mood.   (Anything < 9 systems with 2 bullets each down codes to level 1) (If patient refuses exam can't bill higher level) (Make sure to document decubitus ulcers present on admission -- if possible -- and whether patient has chronic indwelling catheter at time of admission)  Labs on Admission: I have personally reviewed following labs and imaging studies  CBC: Recent Labs  Lab 07/07/19 1854  WBC 6.2  HGB 8.9*  HCT 28.5*  MCV 86.4  PLT 0000000   Basic Metabolic Panel: Recent Labs  Lab 07/07/19 1854  CREATININE 0.53   GFR: Estimated Creatinine Clearance: 52.8 mL/min (by C-G formula based on SCr of 0.53 mg/dL). Liver Function Tests: No results for input(s): AST, ALT, ALKPHOS, BILITOT, PROT, ALBUMIN in the last 168 hours. No results for input(s):  LIPASE, AMYLASE in the last 168 hours. No results for input(s): AMMONIA in the last 168 hours. Coagulation Profile: No results for input(s): INR, PROTIME in the last 168 hours. Cardiac Enzymes: No results for input(s): CKTOTAL, CKMB, CKMBINDEX, TROPONINI in the last 168 hours. BNP (last 3 results) No results for input(s): PROBNP in the last 8760 hours. HbA1C: No results for input(s): HGBA1C in the last 72 hours. CBG: No results for input(s): GLUCAP in the last 168 hours. Lipid Profile: No results for input(s): CHOL, HDL, LDLCALC, TRIG, CHOLHDL, LDLDIRECT in the last 72 hours. Thyroid Function Tests: No results for input(s): TSH, T4TOTAL, FREET4, T3FREE, THYROIDAB in the last 72 hours. Anemia Panel: No results for input(s): VITAMINB12, FOLATE, FERRITIN, TIBC, IRON, RETICCTPCT in the last 72 hours. Urine analysis: No results found for: COLORURINE, APPEARANCEUR, LABSPEC, PHURINE, GLUCOSEU, HGBUR, BILIRUBINUR, KETONESUR, PROTEINUR, UROBILINOGEN, NITRITE, LEUKOCYTESUR  Radiological Exams on Admission: No results found.  EKG: Independently reviewed. No EKG in house to review  Assessment/Plan Active Problems:   Empyema ([place])   Pulmonary hypertension (HCC)   COPD with hypoxia (HCC)   Atrial fibrillation, chronic (HCC)   GERD (gastroesophageal reflux disease)   Essential hypertension  (please populate well all problems here in Problem List. (For example, if patient is on BP meds at home and you resume or decide to hold them, it is a problem that needs to be her. Same for CAD, COPD, HLD and so on)   1. Empyema - large empyema left chest with thick rind and compressed lung. Plan Thoracic surgery consult for drainage and decortication when cleared by cards  Abx coverage - Vanc and Zosyn  2. Pulmonary Hypertension - long term problem. Patient reports right heart cath in 2019. Patient gives no h/o overt heart failure and except for JVD and elevated BNP does not appear to be in acute  failure. Plan Cardiology for surgical clearance:   2 D echo   R&L heart cath  Regarding elevated BNP - clinically stable. Will not start diuretics pending further data  Continue home meds  3. A. Fib - stable rate. Continue home meds except ASA  4. GERD - continue PPI  5. COPD - no increased WOB. Plan continue home regimen  Continue O2 at 2 L [place] to keep sats > 90%  DVT prophylaxis: lovenox (Lovenox/Heparin/SCD's/anticoagulated/None (if comfort care) Code Status: full code  (Full/Partial (specify details) Family Communication: spoke with [name]  Perrett [phone] who is the preferred contact person. Explaned dx and tx plan. Answered all questions (Specify name, relationship. Do not write "discussed with patient". Specify tel # if discussed over the phone) Disposition Plan: TBD 4-7 days (specify when and where you expect patient to be discharged) Consults called: Thoracic surgery- Dr. Orvan Seen; Cardiology-Dr. Haroldine Laws  (with names) Admission status: inpatient telemetry (inpatient / obs / tele / medical floor / SDU)   Adella Hare MD [facility] Pager [phone]  If 7PM-7AM, please contact night-coverage [email or web address] Password [facility]  07/07/2019, 7:54 PM

## 2019-07-07 NOTE — Progress Notes (Signed)
Pharmacy Antibiotic Note  Connie Jarvis is a 79 y.o. female admitted on 07/07/2019 with redness and eruption of pus on the left interior chest wall.  Found to have empyema and Pharmacy has been consulted for vancomycin dosing.  She will also be started on Zosyn.  SCr 0.53, CrCL 53 ml/min, afebrile, WBC WNL.  Plan: Vanc 1500mg  IV x 1, then 1gm IV Q24H for AUC 550 Zosyn EID 3.375gm IV Q8H per MD Monitor renal fxn, clinical progress, vanc AUC as indicated  Height: 5' (152.4 cm) Weight: 157 lb 12.2 oz (71.6 kg) IBW/kg (Calculated) : 45.5  Temp (24hrs), Avg:97.6 F (36.4 C), Min:97.6 F (36.4 C), Max:97.6 F (36.4 C)  Recent Labs  Lab 07/07/19 1854  WBC 6.2  CREATININE 0.53    Estimated Creatinine Clearance: 52.8 mL/min (by C-G formula based on SCr of 0.53 mg/dL).    Allergies  Allergen Reactions  . Codeine Nausea And Vomiting  . Gluten Meal Diarrhea    Abdominal pain and bloating  . Lactose Diarrhea    Bloating and abdominal pain  . Neosporin [Neomycin-Bacitracin Zn-Polymyx]   . Other     Pain medication pt. Cannot remember the name.    Vanc 2/10 >>  Barron Vanloan D. Mina Marble, PharmD, BCPS, [place] 07/07/2019, 8:12 PM

## 2019-07-07 NOTE — Consult Note (Signed)
[address]       [place] [address]             [phone]        Mirka W Lia Gerty Medical Record [id number] Date of Birth: 1940/10/10  Referring: No ref. provider found Primary Care: Ronita Hipps, MD Primary Cardiologist:No primary care provider on file.  Chief Complaint:   No chief complaint on file.  Left chest "boil" History of Present Illness:      79 year old lady with known history of pulmonary hypertension and valvular heart disease presents for evaluation of left chest empyema.  She has known of diagnosis of pulmonary hypertension for approximately 1 year.  She had pneumonia back in the fall.  Ultimately she recovered from that well.  Recently, she noticed redness and eruption of pus on the left inferior chest wall.  For this, she sought evaluation today.  Chest x-ray was notably abnormal which was followed by CT scan demonstrating left empyema with extension into the skin.  She denies fevers or worsening shortness of breath.  She is transferred to [facility] for thoracic surgery consultation.  Current Activity/ Functional Status: Patient will be independent with mobility/ambulation, transfers, ADL's, IADL's.   Zubrod Score: At the time of surgery this patient's most appropriate activity status/level should be described as: []     0    Normal activity, no symptoms []     1    Restricted in physical strenuous activity but ambulatory, able to do out light work []     2    Ambulatory and capable of self care, unable to do work activities, up and about                 more than 50%  Of the time                            []     3    Only limited self care, in bed greater than 50% of waking hours []     4    Completely disabled, no self care, confined to bed or chair []     5    Moribund  Past Medical History:  Diagnosis Date  . Allergy   . Anemia   . Anxiety   . Arthritis   . Cataract    pt. not sure which eye early stage  .  Depression   . GERD (gastroesophageal reflux disease)   . Hypertension     Past Surgical History:  Procedure Laterality Date  . COLONOSCOPY    . MOUTH SURGERY    . TONSILLECTOMY    . TUBAL LIGATION      Social History   Tobacco Use  Smoking Status Never Smoker  Smokeless Tobacco Never Used    Social History   Substance and Sexual Activity  Alcohol Use Never     Allergies  Allergen Reactions  . Codeine Nausea And Vomiting  . Gluten Meal Diarrhea    Abdominal pain and bloating  . Lactose Diarrhea    Bloating and abdominal pain  . Neosporin [Neomycin-Bacitracin Zn-Polymyx]   . Other     Pain medication pt. Cannot remember the name.    Current Facility-Administered Medications  Medication Dose Route Frequency Provider Last Rate Last Admin  . 0.45 % sodium chloride infusion   Intravenous Continuous Norins, Heinz Knuckles, MD      . acetaminophen (TYLENOL)  tablet 500 mg  500 mg Oral Q6H PRN Norins, Heinz Knuckles, MD      . celecoxib (CELEBREX) capsule 200 mg  200 mg Oral BID Norins, Heinz Knuckles, MD      . docusate sodium (COLACE) capsule 100 mg  100 mg Oral BID Norins, Heinz Knuckles, MD      . enoxaparin (LOVENOX) injection 40 mg  40 mg Subcutaneous Q24H Norins, Heinz Knuckles, MD      . ketorolac (TORADOL) 15 MG/ML injection 15 mg  15 mg Intravenous Q6H PRN Norins, Heinz Knuckles, MD      . losartan (COZAAR) tablet 25 mg  25 mg Oral Daily Norins, Heinz Knuckles, MD      . metoprolol succinate (TOPROL-XL) 24 hr tablet 12.5 mg  12.5 mg Oral Daily Norins, Heinz Knuckles, MD      . mometasone-formoterol (DULERA) 100-5 MCG/ACT inhaler 2 puff  2 puff Inhalation BID Norins, Heinz Knuckles, MD      . montelukast (SINGULAIR) tablet 10 mg  10 mg Oral Daily Norins, Heinz Knuckles, MD      . pantoprazole (PROTONIX) EC tablet 40 mg  40 mg Oral Daily Norins, Heinz Knuckles, MD      . traZODone (DESYREL) tablet 25 mg  25 mg Oral QHS PRN Norins, Heinz Knuckles, MD        Facility-Administered Medications Prior to Admission  Medication  Dose Route Frequency Provider Last Rate Last Admin  . 0.9 %  sodium chloride infusion  500 mL Intravenous Once Ladene Artist, MD       Medications Prior to Admission  Medication Sig Dispense Refill Last Dose  . acetaminophen (TYLENOL) 500 MG tablet Take 500 mg by mouth every 6 (six) hours as needed.     Marland Kitchen aspirin EC 81 MG tablet Take 81 mg by mouth daily.     Marland Kitchen b complex vitamins capsule Take 1 capsule by mouth daily.     . budesonide-formoterol (SYMBICORT) 80-4.5 MCG/ACT inhaler Inhale 2 puffs into the lungs 2 (two) times daily.     . Calcium Carbonate-Vitamin D (CALTRATE 600+D) 600-400 MG-UNIT tablet Take 1 tablet by mouth daily.     . celecoxib (CELEBREX) 200 MG capsule Take 200 mg by mouth 2 (two) times daily.  3   . Cholecalciferol (VITAMIN D3) 10000 units TABS Take 1 tablet by mouth 2 (two) times daily.     . clindamycin (CLEOCIN) 300 MG capsule Take 300 mg by mouth 2 (two) times daily.     . Glucosamine-Chondroit-Vit C-Mn (GLUCOSAMINE-CHONDROITIN MAX ST) CAPS Take 1 capsule by mouth 2 (two) times daily.     Marland Kitchen losartan (COZAAR) 25 MG tablet Take 25 mg by mouth daily.  3   . metoprolol succinate (TOPROL-XL) 25 MG 24 hr tablet Take 12.5 mg by mouth daily.     . montelukast (SINGULAIR) 10 MG tablet Take 10 mg by mouth daily.     . Multiple Vitamin (MULTIVITAMIN) tablet Take 1 tablet by mouth daily.     . Omega-3 Fatty Acids (FISH OIL) 1000 MG CAPS Take 1 capsule by mouth daily.     Marland Kitchen omeprazole (PRILOSEC) 20 MG capsule Take 20 mg by mouth daily.     . valACYclovir (VALTREX) 1000 MG tablet Take 1,000 mg by mouth as needed.       Family History  Problem Relation Age of Onset  . Colon cancer Neg Hx   . Colon polyps Neg Hx   . Esophageal cancer Neg Hx   .  Pancreatic cancer Neg Hx   . Rectal cancer Neg Hx   . Stomach cancer Neg Hx      Review of Systems:   ROS Pertinent items are noted in HPI.     Cardiac Review of Systems: Y or  [    ]= no  Chest Pain [   nn ]  Resting SOB [  y  ] Exertional SOB  [ y ]  Orthopnea [  ]   Pedal Edema [   ]    Palpitations [  ] Syncope  [  ]   Presyncope [   ] documented h/o aortic and mitral valve disease and pulmonary HTN (per her report--no records available)  General Review of Systems: [Y] = yes [  ]=no Constitional: recent weight change [n  ]; anorexia [n  ]; fatigue [ y ]; nausea [  ]; night sweats [  ]; fever [  ]; or chills [  ]                                                               Dental: Last Dentist visit:   Eye : blurred vision [  ]; diplopia [   ]; vision changes [  ];  Amaurosis fugax[  ]; Resp: cough [n  ];  wheezing[  ];  hemoptysis[  ]; shortness of breath[  ]; paroxysmal nocturnal dyspnea[  ]; dyspnea on exertion[  ]; or orthopnea[  ];  GI:  gallstones[  ], vomiting[  ];  dysphagia[  ]; melena[  ];  hematochezia [  ]; heartburn[  ];   Hx of  Colonoscopy[  ]; GU: kidney stones [  ]; hematuria[  ];   dysuria [  ];  nocturia[  ];  history of     obstruction [  ]; urinary frequency [  ]             Skin: rash, swelling[  ];, hair loss[  ];  peripheral edema[  ];  or itching[  ]; Musculosketetal: myalgias[  ];  joint swelling[  ];  joint erythema[  ];  joint pain[  ];  back pain[  ];  Heme/Lymph: bruising[  ];  bleeding[  ];  anemia[  ];  Neuro: TIA[  ];  headaches[  ];  stroke[  ];  vertigo[  ];  seizures[  ];   paresthesias[  ];  difficulty walking[  ];  Psych:depression[  ]; anxiety[  ];  Endocrine: diabetes[  ];  thyroid dysfunction[  ];              Physical Exam: BP 118/74   Pulse (!) 116   Temp 97.6 F (36.4 C) (Oral)   Resp 19   Ht 5' (1.524 m)   Wt 71.6 kg   SpO2 100%   BMI 30.81 kg/m    General appearance: alert and cooperative Head: Normocephalic, without obvious abnormality, atraumatic Lymph nodes: Cervical, supraclavicular, and axillary nodes normal. Resp: diminished breath sounds LLL Cardio: irregularly irregular rhythm GI: soft, non-tender; bowel sounds normal; no masses,  no  organomegaly Extremities: edema 3+ Neurologic: Alert and oriented X 3, normal strength and tone. Normal symmetric reflexes. Normal coordination and gait  Diagnostic Studies & Laboratory data:     Recent Radiology Findings:  No results found.  CT chest from 07/07/19 is reviewed   I have independently reviewed the above radiologic studies and discussed with the patient   Recent Lab Findings: Lab Results  Component Value Date   WBC 6.2 07/07/2019   HGB 8.9 (L) 07/07/2019   HCT 28.5 (L) 07/07/2019   PLT 254 07/07/2019   CREATININE 0.53 07/07/2019      Assessment / Plan:       79 yo lady with pulmonary hypertension and evidence for left empyema.  This has erupted onto the skin.  Although she will need surgery for empyema evacuation, I am most concerned about her fitness for general anesthesia with one lung ventilation.  Have asked Dr. Haroldine Laws to see her.    I  spent 30 minutes counseling the patient face to face.   Zaniya Mcaulay Z. Orvan Seen, MD [phone] 07/07/2019 7:45 PM

## 2019-07-08 ENCOUNTER — Inpatient Hospital Stay (HOSPITAL_COMMUNITY): Payer: Medicare Other

## 2019-07-08 ENCOUNTER — Inpatient Hospital Stay (HOSPITAL_COMMUNITY): Payer: Medicare Other | Admitting: Certified Registered"

## 2019-07-08 ENCOUNTER — Other Ambulatory Visit (HOSPITAL_COMMUNITY): Payer: Medicare Other

## 2019-07-08 ENCOUNTER — Encounter (HOSPITAL_COMMUNITY)
Admission: AD | Disposition: A | Discharge status: Rehabilitation Facility | Payer: Self-pay | Source: Other Acute Inpatient Hospital | Attending: Cardiothoracic Surgery

## 2019-07-08 DIAGNOSIS — I482 Chronic atrial fibrillation, unspecified: Secondary | ICD-10-CM

## 2019-07-08 DIAGNOSIS — J869 Pyothorax without fistula: Principal | ICD-10-CM

## 2019-07-08 DIAGNOSIS — I5031 Acute diastolic (congestive) heart failure: Secondary | ICD-10-CM

## 2019-07-08 DIAGNOSIS — Z01810 Encounter for preprocedural cardiovascular examination: Secondary | ICD-10-CM

## 2019-07-08 DIAGNOSIS — I272 Pulmonary hypertension, unspecified: Secondary | ICD-10-CM

## 2019-07-08 HISTORY — PX: EMPYEMA DRAINAGE: SHX5097

## 2019-07-08 HISTORY — PX: THORACOTOMY: SHX5074

## 2019-07-08 HISTORY — PX: CENTRAL LINE INSERTION: CATH118232

## 2019-07-08 HISTORY — PX: APPLICATION OF WOUND VAC: SHX5189

## 2019-07-08 HISTORY — PX: RIGHT/LEFT HEART CATH AND CORONARY ANGIOGRAPHY: CATH118266

## 2019-07-08 LAB — POCT I-STAT 7, (LYTES, BLD GAS, ICA,H+H)
Acid-Base Excess: 8 mmol/L — ABNORMAL HIGH (ref 0.0–2.0)
Acid-Base Excess: 4 mmol/L — ABNORMAL HIGH (ref 0.0–2.0)
Bicarbonate: 34.5 mmol/L — ABNORMAL HIGH (ref 20.0–28.0)
Bicarbonate: 28.7 mmol/L — ABNORMAL HIGH (ref 20.0–28.0)
Calcium, Ion: 1.17 mmol/L (ref 1.15–1.40)
Calcium, Ion: 1.17 mmol/L (ref 1.15–1.40)
HCT: 24 % — ABNORMAL LOW (ref 36.0–46.0)
HCT: 28 % — ABNORMAL LOW (ref 36.0–46.0)
Hemoglobin: 8.2 g/dL — ABNORMAL LOW (ref 12.0–15.0)
Hemoglobin: 9.5 g/dL — ABNORMAL LOW (ref 12.0–15.0)
O2 Saturation: 100 %
O2 Saturation: 96 %
Patient temperature: 35.2
Potassium: 3.6 mmol/L (ref 3.5–5.1)
Potassium: 3.5 mmol/L (ref 3.5–5.1)
Sodium: 143 mmol/L (ref 135–145)
Sodium: 142 mmol/L (ref 135–145)
TCO2: 36 mmol/L — ABNORMAL HIGH (ref 22–32)
TCO2: 30 mmol/L (ref 22–32)
pCO2 arterial: 57.9 mmHg — ABNORMAL HIGH (ref 32.0–48.0)
pCO2 arterial: 41.6 mmHg (ref 32.0–48.0)
pH, Arterial: 7.383 (ref 7.350–7.450)
pH, Arterial: 7.44 (ref 7.350–7.450)
pO2, Arterial: 280 mmHg — ABNORMAL HIGH (ref 83.0–108.0)
pO2, Arterial: 72 mmHg — ABNORMAL LOW (ref 83.0–108.0)

## 2019-07-08 LAB — BASIC METABOLIC PANEL
Anion gap: 6 (ref 5–15)
Anion gap: 10 (ref 5–15)
BUN: 8 mg/dL (ref 8–23)
BUN: 7 mg/dL — ABNORMAL LOW (ref 8–23)
CO2: 33 mmol/L — ABNORMAL HIGH (ref 22–32)
CO2: 26 mmol/L (ref 22–32)
Calcium: 7.9 mg/dL — ABNORMAL LOW (ref 8.9–10.3)
Calcium: 7.8 mg/dL — ABNORMAL LOW (ref 8.9–10.3)
Chloride: 102 mmol/L (ref 98–111)
Chloride: 105 mmol/L (ref 98–111)
Creatinine, Ser: 0.53 mg/dL (ref 0.44–1.00)
Creatinine, Ser: 0.67 mg/dL (ref 0.44–1.00)
GFR calc Af Amer: >60 mL/min (ref >60)
GFR calc Af Amer: >60 mL/min (ref >60)
GFR calc non Af Amer: >60 mL/min (ref >60)
GFR calc non Af Amer: >60 mL/min (ref >60)
Glucose, Bld: 78 mg/dL (ref 70–99)
Glucose, Bld: 89 mg/dL (ref 70–99)
Potassium: 3.6 mmol/L (ref 3.5–5.1)
Potassium: 3.6 mmol/L (ref 3.5–5.1)
Sodium: 141 mmol/L (ref 135–145)
Sodium: 141 mmol/L (ref 135–145)

## 2019-07-08 LAB — CBC
HCT: 23.8 % — ABNORMAL LOW (ref 36.0–46.0)
HCT: 29.6 % — ABNORMAL LOW (ref 36.0–46.0)
HCT: 30.8 % — ABNORMAL LOW (ref 36.0–46.0)
Hemoglobin: 7.5 g/dL — ABNORMAL LOW (ref 12.0–15.0)
Hemoglobin: 9.6 g/dL — ABNORMAL LOW (ref 12.0–15.0)
Hemoglobin: 10.3 g/dL — ABNORMAL LOW (ref 12.0–15.0)
MCH: 27.3 pg (ref 26.0–34.0)
MCH: 28.5 pg (ref 26.0–34.0)
MCH: 29.2 pg (ref 26.0–34.0)
MCHC: 31.5 g/dL (ref 30.0–36.0)
MCHC: 32.4 g/dL (ref 30.0–36.0)
MCHC: 33.4 g/dL (ref 30.0–36.0)
MCV: 86.5 fL (ref 80.0–100.0)
MCV: 87.8 fL (ref 80.0–100.0)
MCV: 87.3 fL (ref 80.0–100.0)
Platelets: 219 10*3/uL (ref 150–400)
Platelets: 203 10*3/uL (ref 150–400)
Platelets: 237 10*3/uL (ref 150–400)
RBC: 2.75 MIL/uL — ABNORMAL LOW (ref 3.87–5.11)
RBC: 3.37 MIL/uL — ABNORMAL LOW (ref 3.87–5.11)
RBC: 3.53 MIL/uL — ABNORMAL LOW (ref 3.87–5.11)
RDW: 17 % — ABNORMAL HIGH (ref 11.5–15.5)
RDW: 15.5 % (ref 11.5–15.5)
RDW: 15.5 % (ref 11.5–15.5)
WBC: 5.1 10*3/uL (ref 4.0–10.5)
WBC: 6 10*3/uL (ref 4.0–10.5)
WBC: 11.5 10*3/uL — ABNORMAL HIGH (ref 4.0–10.5)
nRBC: 0 % (ref 0.0–0.2)
nRBC: 0 % (ref 0.0–0.2)
nRBC: 0 % (ref 0.0–0.2)

## 2019-07-08 LAB — GLUCOSE, CAPILLARY
Glucose-Capillary: 66 mg/dL — ABNORMAL LOW (ref 70–99)
Glucose-Capillary: 85 mg/dL (ref 70–99)

## 2019-07-08 LAB — POCT I-STAT EG7
Acid-Base Excess: 3 mmol/L — ABNORMAL HIGH (ref 0.0–2.0)
Bicarbonate: 29.3 mmol/L — ABNORMAL HIGH (ref 20.0–28.0)
Calcium, Ion: 0.97 mmol/L — ABNORMAL LOW (ref 1.15–1.40)
HCT: 22 % — ABNORMAL LOW (ref 36.0–46.0)
Hemoglobin: 7.5 g/dL — ABNORMAL LOW (ref 12.0–15.0)
O2 Saturation: 78 %
Potassium: 3 mmol/L — ABNORMAL LOW (ref 3.5–5.1)
Sodium: 147 mmol/L — ABNORMAL HIGH (ref 135–145)
TCO2: 31 mmol/L (ref 22–32)
pCO2, Ven: 53.6 mmHg (ref 44.0–60.0)
pH, Ven: 7.345 (ref 7.250–7.430)
pO2, Ven: 46 mmHg — ABNORMAL HIGH (ref 32.0–45.0)

## 2019-07-08 LAB — ABO/RH: ABO/RH(D): A POS

## 2019-07-08 LAB — RESPIRATORY PANEL BY RT PCR (FLU A&B, COVID)
Influenza A by PCR: NEGATIVE
Influenza B by PCR: NEGATIVE
SARS Coronavirus 2 by RT PCR: NEGATIVE

## 2019-07-08 LAB — PREPARE RBC (CROSSMATCH)

## 2019-07-08 LAB — ECHOCARDIOGRAM COMPLETE
Height: 60 in
Weight: 2524.18 oz

## 2019-07-08 SURGERY — RIGHT/LEFT HEART CATH AND CORONARY ANGIOGRAPHY
Anesthesia: LOCAL

## 2019-07-08 SURGERY — THORACOTOMY, MAJOR
Anesthesia: General | Laterality: Left

## 2019-07-08 MED ORDER — LIDOCAINE HCL (PF) 1 % IJ SOLN
INTRAMUSCULAR | Status: AC
  Filled 2019-07-08: qty 30

## 2019-07-08 MED ORDER — HEPARIN (PORCINE) IN NACL 1000-0.9 UT/500ML-% IV SOLN
INTRAVENOUS | Status: DC | PRN
  Administered 2019-07-08: 500 mL

## 2019-07-08 MED ORDER — PROPOFOL 10 MG/ML IV BOLUS
INTRAVENOUS | Status: DC | PRN
  Administered 2019-07-08: 120 mg via INTRAVENOUS

## 2019-07-08 MED ORDER — ALBUMIN HUMAN 5 % IV SOLN: 12.5000 g | Freq: Once | INTRAVENOUS | Status: AC

## 2019-07-08 MED ORDER — ROCURONIUM BROMIDE 10 MG/ML (PF) SYRINGE
PREFILLED_SYRINGE | INTRAVENOUS | Status: DC | PRN
  Administered 2019-07-08: 60 mg via INTRAVENOUS
  Administered 2019-07-08: 10 mg via INTRAVENOUS

## 2019-07-08 MED ORDER — ALBUMIN HUMAN 5 % IV SOLN
12.5000 g | Freq: Once | INTRAVENOUS | Status: AC
  Administered 2019-07-08: 12.5 g via INTRAVENOUS
  Filled 2019-07-08: qty 250

## 2019-07-08 MED ORDER — HEPARIN SODIUM (PORCINE) 1000 UNIT/ML IJ SOLN
INTRAMUSCULAR | Status: AC
  Filled 2019-07-08: qty 1

## 2019-07-08 MED ORDER — NOREPINEPHRINE 4 MG/250ML-% IV SOLN
INTRAVENOUS | Status: AC
  Administered 2019-07-08: 19:00:00 5 ug/min via INTRAVENOUS
  Filled 2019-07-08: qty 250

## 2019-07-08 MED ORDER — PROPOFOL 10 MG/ML IV BOLUS
INTRAVENOUS | Status: AC
  Filled 2019-07-08: qty 20

## 2019-07-08 MED ORDER — NOREPINEPHRINE 4 MG/250ML-% IV SOLN
0.0000 ug/min | INTRAVENOUS | Status: DC
  Administered 2019-07-09: 02:00:00 7 ug/min via INTRAVENOUS
  Administered 2019-07-09: 5 ug/min via INTRAVENOUS
  Administered 2019-07-10: 16:00:00 10 ug/min via INTRAVENOUS
  Filled 2019-07-08 (×4): qty 250

## 2019-07-08 MED ORDER — 0.9 % SODIUM CHLORIDE (POUR BTL) OPTIME
TOPICAL | Status: DC | PRN
  Administered 2019-07-08: 2000 mL

## 2019-07-08 MED ORDER — HEPARIN (PORCINE) IN NACL 1000-0.9 UT/500ML-% IV SOLN
INTRAVENOUS | Status: AC
  Filled 2019-07-08: qty 1000

## 2019-07-08 MED ORDER — POTASSIUM CHLORIDE 10 MEQ/50ML IV SOLN
10.0000 meq | INTRAVENOUS | Status: AC
  Administered 2019-07-09 (×3): 10 meq via INTRAVENOUS
  Filled 2019-07-08: qty 50

## 2019-07-08 MED ORDER — ASPIRIN 81 MG PO CHEW
81.0000 mg | CHEWABLE_TABLET | ORAL | Status: AC
  Administered 2019-07-08: 81 mg via ORAL
  Filled 2019-07-08: qty 1

## 2019-07-08 MED ORDER — VERAPAMIL HCL 2.5 MG/ML IV SOLN
INTRAVENOUS | Status: AC
  Filled 2019-07-08: qty 2

## 2019-07-08 MED ORDER — HEPARIN SODIUM (PORCINE) 1000 UNIT/ML IJ SOLN
INTRAMUSCULAR | Status: DC | PRN
  Administered 2019-07-08: 3500 [IU] via INTRAVENOUS

## 2019-07-08 MED ORDER — CEFAZOLIN SODIUM-DEXTROSE 2-3 GM-%(50ML) IV SOLR
INTRAVENOUS | Status: DC | PRN
  Administered 2019-07-08: 2 g via INTRAVENOUS

## 2019-07-08 MED ORDER — CHLORHEXIDINE GLUCONATE CLOTH 2 % EX PADS
6.0000 | MEDICATED_PAD | Freq: Every day | CUTANEOUS | Status: DC
  Administered 2019-07-09 – 2019-07-24 (×11): 6 via TOPICAL

## 2019-07-08 MED ORDER — VERAPAMIL HCL 2.5 MG/ML IV SOLN
INTRAVENOUS | Status: DC | PRN
  Administered 2019-07-08: 10 mL via INTRA_ARTERIAL

## 2019-07-08 MED ORDER — ALBUTEROL SULFATE HFA 108 (90 BASE) MCG/ACT IN AERS
INHALATION_SPRAY | RESPIRATORY_TRACT | Status: DC | PRN
  Administered 2019-07-08: 2 via RESPIRATORY_TRACT

## 2019-07-08 MED ORDER — IOHEXOL 350 MG/ML SOLN
INTRAVENOUS | Status: DC | PRN
  Administered 2019-07-08: 55 mL

## 2019-07-08 MED ORDER — DEXMEDETOMIDINE HCL IN NACL 200 MCG/50ML IV SOLN
INTRAVENOUS | Status: DC | PRN
  Administered 2019-07-08: .7 ug/kg/h via INTRAVENOUS

## 2019-07-08 MED ORDER — LACTATED RINGERS IV SOLN: INTRAVENOUS | Status: DC | PRN

## 2019-07-08 MED ORDER — DOCUSATE SODIUM 50 MG/5ML PO LIQD
100.0000 mg | Freq: Two times a day (BID) | ORAL | Status: DC
  Administered 2019-07-08 – 2019-07-09 (×2): 100 mg
  Filled 2019-07-08 (×3): qty 10

## 2019-07-08 MED ORDER — PHENYLEPHRINE 40 MCG/ML (10ML) SYRINGE FOR IV PUSH (FOR BLOOD PRESSURE SUPPORT)
PREFILLED_SYRINGE | INTRAVENOUS | Status: DC | PRN
  Administered 2019-07-08 (×2): 80 ug via INTRAVENOUS

## 2019-07-08 MED ORDER — DEXMEDETOMIDINE HCL IN NACL 400 MCG/100ML IV SOLN
0.4000 ug/kg/h | INTRAVENOUS | Status: DC
  Administered 2019-07-08 – 2019-07-09 (×2): 0.7 ug/kg/h via INTRAVENOUS
  Filled 2019-07-08: qty 200

## 2019-07-08 MED ORDER — LIDOCAINE HCL (PF) 1 % IJ SOLN
INTRAMUSCULAR | Status: DC | PRN
  Administered 2019-07-08: 2 mL
  Administered 2019-07-08 (×2): 5 mL

## 2019-07-08 MED ORDER — HEMOSTATIC AGENTS (NO CHARGE) OPTIME
TOPICAL | Status: DC | PRN
  Administered 2019-07-08 (×2): 1 via TOPICAL

## 2019-07-08 MED ORDER — LIDOCAINE 2% (20 MG/ML) 5 ML SYRINGE
INTRAMUSCULAR | Status: DC | PRN
  Administered 2019-07-08: 30 mg via INTRAVENOUS

## 2019-07-08 MED ORDER — SODIUM CHLORIDE 0.9 % IV SOLN: INTRAVENOUS | Status: DC | PRN

## 2019-07-08 MED ORDER — ALBUMIN HUMAN 5 % IV SOLN
INTRAVENOUS | Status: AC
  Administered 2019-07-08: 19:00:00 12.5 g via INTRAVENOUS
  Filled 2019-07-08: qty 250

## 2019-07-08 MED ORDER — VANCOMYCIN HCL 1000 MG IV SOLR
INTRAVENOUS | Status: DC
  Filled 2019-07-08: qty 1000

## 2019-07-08 MED ORDER — PHENYLEPHRINE HCL-NACL 10-0.9 MG/250ML-% IV SOLN
INTRAVENOUS | Status: DC | PRN
  Administered 2019-07-08: 25 ug/min via INTRAVENOUS

## 2019-07-08 MED ORDER — DEXTROSE 50 % IV SOLN: 12.5000 g | INTRAVENOUS | Status: AC

## 2019-07-08 MED ORDER — DEXTROSE 50 % IV SOLN
INTRAVENOUS | Status: AC
  Administered 2019-07-08: 12.5 g via INTRAVENOUS
  Filled 2019-07-08: qty 50

## 2019-07-08 MED ORDER — SODIUM CHLORIDE 0.9 % IV SOLN: INTRAVENOUS | Status: DC

## 2019-07-08 MED ORDER — CEFAZOLIN SODIUM-DEXTROSE 2-4 GM/100ML-% IV SOLN
INTRAVENOUS | Status: AC
  Filled 2019-07-08: qty 100

## 2019-07-08 MED ORDER — SODIUM CHLORIDE 0.9 % IV SOLN: 10.0000 mL/h | Freq: Once | INTRAVENOUS | Status: DC

## 2019-07-08 MED ORDER — FENTANYL CITRATE (PF) 250 MCG/5ML IJ SOLN
INTRAMUSCULAR | Status: AC
  Filled 2019-07-08: qty 5

## 2019-07-08 MED ORDER — FENTANYL CITRATE (PF) 250 MCG/5ML IJ SOLN
INTRAMUSCULAR | Status: DC | PRN
  Administered 2019-07-08 (×2): 50 ug via INTRAVENOUS
  Administered 2019-07-08: 100 ug via INTRAVENOUS
  Administered 2019-07-08: 50 ug via INTRAVENOUS

## 2019-07-08 MED ORDER — 0.9 % SODIUM CHLORIDE (POUR BTL) OPTIME
TOPICAL | Status: DC | PRN
  Administered 2019-07-08: 3000 mL

## 2019-07-08 SURGICAL SUPPLY — 97 items
ADH SKN CLS APL DERMABOND .7 (GAUZE/BANDAGES/DRESSINGS)
BAG SPEC RTRVL LRG 6X4 10 (ENDOMECHANICALS)
BLADE CLIPPER SURG (BLADE) ×1 IMPLANT
CANISTER SUCT 3000ML PPV (MISCELLANEOUS) ×3 IMPLANT
CANISTER WOUNDNEG PRESSURE 500 (CANNISTER) ×1 IMPLANT
CATH THOR STR 32F SOFT 20 RADI (CATHETERS) ×1 IMPLANT
CLIP VESOCCLUDE MED 24/CT (CLIP) ×1 IMPLANT
CLIP VESOCCLUDE MED 6/CT (CLIP) ×2 IMPLANT
CNTNR URN SCR LID CUP LEK RST (MISCELLANEOUS) ×2 IMPLANT
CONN ST 1/4X3/8  BEN (MISCELLANEOUS)
CONN ST 1/4X3/8 BEN (MISCELLANEOUS) IMPLANT
CONN Y 3/8X3/8X3/8  BEN (MISCELLANEOUS)
CONN Y 3/8X3/8X3/8 BEN (MISCELLANEOUS) IMPLANT
CONT SPEC 4OZ STRL OR WHT (MISCELLANEOUS) ×8
COVER SURGICAL LIGHT HANDLE (MISCELLANEOUS) ×1 IMPLANT
DERMABOND ADVANCED (GAUZE/BANDAGES/DRESSINGS)
DERMABOND ADVANCED .7 DNX12 (GAUZE/BANDAGES/DRESSINGS) ×1 IMPLANT
DRAIN CHANNEL 28F RND 3/8 FF (WOUND CARE) ×1 IMPLANT
DRAPE CV SPLIT W-CLR ANES SCRN (DRAPES) ×1 IMPLANT
DRAPE ORTHO SPLIT 77X108 STRL (DRAPES) ×2
DRAPE SLUSH/WARMER DISC (DRAPES) ×1 IMPLANT
DRAPE SURG ORHT 6 SPLT 77X108 (DRAPES) ×1 IMPLANT
DRAPE UNIVERSAL PACK (DRAPES) ×1 IMPLANT
DRAPE WARM FLUID 44X44 (DRAPES) ×1 IMPLANT
DRESSING VERAFLO CLEANSE CC (GAUZE/BANDAGES/DRESSINGS) IMPLANT
DRSG VERAFLO CLEANSE CC (GAUZE/BANDAGES/DRESSINGS) ×2
ELECT BLADE 4.0 EZ CLEAN MEGAD (MISCELLANEOUS) ×2
ELECT BLADE 6.5 EXT (BLADE) ×1 IMPLANT
ELECT REM PT RETURN 9FT ADLT (ELECTROSURGICAL) ×2
ELECTRODE BLDE 4.0 EZ CLN MEGD (MISCELLANEOUS) IMPLANT
ELECTRODE REM PT RTRN 9FT ADLT (ELECTROSURGICAL) ×1 IMPLANT
GAUZE SPONGE 4X4 12PLY STRL (GAUZE/BANDAGES/DRESSINGS) IMPLANT
GLOVE BIO SURGEON STRL SZ 6.5 (GLOVE) ×2 IMPLANT
GLOVE NEODERM STRL 7.5 LF PF (GLOVE) ×2 IMPLANT
GLOVE SURG NEODERM 7.5  LF PF (GLOVE) ×2
GLOVE SURG SS PI 7.0 STRL IVOR (GLOVE) ×1 IMPLANT
GOWN STRL REUS W/ TWL LRG LVL3 (GOWN DISPOSABLE) ×2 IMPLANT
GOWN STRL REUS W/TWL LRG LVL3 (GOWN DISPOSABLE) ×12
HEMOSTAT SURGICEL 2X14 (HEMOSTASIS) ×1 IMPLANT
KIT BASIN OR (CUSTOM PROCEDURE TRAY) ×2 IMPLANT
KIT SUCTION CATH 14FR (SUCTIONS) ×1 IMPLANT
KIT TURNOVER KIT B (KITS) ×2 IMPLANT
NS IRRIG 1000ML POUR BTL (IV SOLUTION) ×5 IMPLANT
PACK CHEST (CUSTOM PROCEDURE TRAY) ×2 IMPLANT
PAD ARMBOARD 7.5X6 YLW CONV (MISCELLANEOUS) ×4 IMPLANT
POUCH ENDO CATCH II 15MM (MISCELLANEOUS) IMPLANT
POUCH SPECIMEN RETRIEVAL 10MM (ENDOMECHANICALS) IMPLANT
POWDER SURGICEL 3.0 GRAM (HEMOSTASIS) ×1 IMPLANT
SCISSORS LAP 5X35 DISP (ENDOMECHANICALS) IMPLANT
SEALANT PROGEL (MISCELLANEOUS) IMPLANT
SEALANT SURG COSEAL 4ML (VASCULAR PRODUCTS) IMPLANT
SEALANT SURG COSEAL 8ML (VASCULAR PRODUCTS) IMPLANT
SET INTERPULSE LAVAGE W/TIP (ORTHOPEDIC DISPOSABLE SUPPLIES) ×1 IMPLANT
SHEARS HARMONIC HDI 20CM (ELECTROSURGICAL) IMPLANT
SOL ANTI FOG 6CC (MISCELLANEOUS) ×1 IMPLANT
SOLUTION ANTI FOG 6CC (MISCELLANEOUS)
SPECIMEN JAR MEDIUM (MISCELLANEOUS) ×2 IMPLANT
SPONGE INTESTINAL PEANUT (DISPOSABLE) ×1 IMPLANT
SPONGE LAP 18X18 X RAY DECT (DISPOSABLE) ×3 IMPLANT
SPONGE TONSIL TAPE 1 RFD (DISPOSABLE) ×1 IMPLANT
STAPLER VISISTAT 35W (STAPLE) ×1 IMPLANT
SUT MNCRL AB 4-0 PS2 18 (SUTURE) ×1 IMPLANT
SUT PDS AB 1 CTX 36 (SUTURE) ×1 IMPLANT
SUT PROLENE 2 TP 1 (SUTURE) ×1 IMPLANT
SUT PROLENE 4 0 RB 1 (SUTURE) ×4
SUT PROLENE 4-0 RB1 .5 CRCL 36 (SUTURE) IMPLANT
SUT SILK  1 MH (SUTURE) ×1
SUT SILK 1 MH (SUTURE) ×2 IMPLANT
SUT SILK 1 TIES 10X30 (SUTURE) ×1 IMPLANT
SUT SILK 2 0 SH (SUTURE) IMPLANT
SUT SILK 2 0SH CR/8 30 (SUTURE) IMPLANT
SUT SILK 3 0 SH 30 (SUTURE) IMPLANT
SUT SILK 3 0SH CR/8 30 (SUTURE) ×1 IMPLANT
SUT VIC AB 0 CT1 27 (SUTURE)
SUT VIC AB 0 CT1 27XBRD ANBCTR (SUTURE) ×1 IMPLANT
SUT VIC AB 0 CTX 27 (SUTURE) IMPLANT
SUT VIC AB 1 CTX 27 (SUTURE) ×1 IMPLANT
SUT VIC AB 2-0 CT1 27 (SUTURE)
SUT VIC AB 2-0 CT1 TAPERPNT 27 (SUTURE) IMPLANT
SUT VIC AB 2-0 CTX 36 (SUTURE) ×4 IMPLANT
SUT VIC AB 3-0 MH 27 (SUTURE) IMPLANT
SUT VIC AB 3-0 SH 27 (SUTURE)
SUT VIC AB 3-0 SH 27X BRD (SUTURE) IMPLANT
SUT VIC AB 3-0 X1 27 (SUTURE) ×1 IMPLANT
SUT VICRYL 0 UR6 27IN ABS (SUTURE) IMPLANT
SUT VICRYL 2 TP 1 (SUTURE) IMPLANT
SWAB COLLECTION DEVICE MRSA (MISCELLANEOUS) ×1 IMPLANT
SWAB CULTURE ESWAB REG 1ML (MISCELLANEOUS) ×1 IMPLANT
SYR 30ML LL (SYRINGE) ×1 IMPLANT
SYSTEM SAHARA CHEST DRAIN ATS (WOUND CARE) ×2 IMPLANT
TIP APPLICATOR SPRAY EXTEND 16 (VASCULAR PRODUCTS) IMPLANT
TOWEL GREEN STERILE (TOWEL DISPOSABLE) ×2 IMPLANT
TOWEL GREEN STERILE FF (TOWEL DISPOSABLE) ×2 IMPLANT
TRAY FOLEY MTR SLVR 16FR STAT (SET/KITS/TRAYS/PACK) ×2 IMPLANT
TROCAR XCEL BLADELESS 5X75MML (TROCAR) ×1 IMPLANT
TROCAR XCEL NON-BLD 5MMX100MML (ENDOMECHANICALS) IMPLANT
WATER STERILE IRR 1000ML POUR (IV SOLUTION) ×4 IMPLANT

## 2019-07-08 SURGICAL SUPPLY — 15 items
CATH 5FR JL3.5 JR4 ANG PIG MP (CATHETERS) ×1 IMPLANT
CATH SWAN GANZ VIP 7.5F (CATHETERS) ×1 IMPLANT
GLIDESHEATH SLEND SS 6F .021 (SHEATH) ×1 IMPLANT
GUIDEWIRE INQWIRE 1.5J.035X260 (WIRE) IMPLANT
INQWIRE 1.5J .035X260CM (WIRE) ×3
KIT HEART LEFT (KITS) ×1 IMPLANT
KIT MICROPUNCTURE NIT STIFF (SHEATH) ×1 IMPLANT
PACK CARDIAC CATHETERIZATION (CUSTOM PROCEDURE TRAY) ×3 IMPLANT
SHEATH PINNACLE 8F 10CM (SHEATH) ×1 IMPLANT
SHEATH PROBE COVER 6X72 (BAG) ×1 IMPLANT
SLEEVE REPOSITIONING LENGTH 30 (MISCELLANEOUS) ×1 IMPLANT
TRANSDUCER W/STOPCOCK (MISCELLANEOUS) ×3 IMPLANT
TRAY CATH 3LUMEN 20C SULFAFREE (CATHETERS) ×1 IMPLANT
WIRE HI TORQ VERSACORE-J 145CM (WIRE) ×1 IMPLANT
WIRE TORQFLEX AUST .018X40CM (WIRE) ×2 IMPLANT

## 2019-07-08 NOTE — H&P (Signed)
History and Physical Interval Note:  07/08/2019 2:07 PM  Connie Jarvis  has presented today for surgery, with the diagnosis of heart failure.  The various methods of treatment have been discussed with the patient and family. After consideration of risks, benefits and other options for treatment, the patient has consented to PROCEDURE: LEFT THORACOTOMY FOR EVACUATION OF EMPYEMA AND WOUND VAC PLACEMENT IN THE SOFT TISSUE as a surgical intervention.  The patient's history has been reviewed, patient examined, no change in status, stable for surgery.  I have reviewed the patient's chart and labs.  Questions were answered to the patient's satisfaction.     Suzannah Bettes Z Buel Molder TCTS

## 2019-07-08 NOTE — Progress Notes (Signed)
PROGRESS NOTE  Connie Jarvis  [id number] DOB: [birth year] DOA: 07/07/2019 PCP: Ronita Hipps, MD   Brief Narrative: Per HPI:  Connie Jarvis is a 79 y.o. female with medical history significant of O2 dependent COPD. Recently seen by GS for left upper chest wall lesion. S/p I&D and placed on Abx. Patient was to have further debridement of lesion but c/o SOB.  Seen in f/u by pulmonology. Patient found to have a large fungating mass, 8x20cm draining yellow fluid. CT scan of the chest revealed a large empyema measuring 13.4x12.5 cm with compressive atelectasis. GS recommended transfer to tertiary care. Case discussed with Dr. Orvan Seen of CT surgery. Patient transferred to [facility] for further surgical treatment, Vanc and Zosyn.   Assessment & Plan: Active Problems:   Empyema ([place])   Pulmonary hypertension (HCC)   COPD with hypoxia (HCC)   Atrial fibrillation, chronic (HCC)   GERD (gastroesophageal reflux disease)   Essential hypertension  Empyema:  - Requires evacuation per TCTS, pending cardiology clearance - Continue vancomycin, zosyn, tailor to blood and empyema cultures.   Pulmonary HTN, atrial fibrillation, HTN:  - Cardiology for surgical clearance. L/RHC today. Needs rapid covid testing.  - Holding anticoagulation for now - Beta blocker, losartan   COPD on 2L O2: No exacerbation noted currently.  - Continue supplemental oxygen.  - Continue dulera, singulair, prn BDs - Of course, very high risk of perioperative complications.   GERD:  - Continue PPI, currently NPO  DVT prophylaxis: Lovenox given last night Code Status: Full Family Communication: None at bedside Disposition Plan: Uncertain.   Consultants:   Cardiothoracic surgery  Cardiology  Procedures:   LHC, RHC, VATS planned  Antimicrobials:  Vancomycin, zosyn   Subjective: Patient actually denies pain, but is having significant drainage from "boil." No fevers.   Objective: Vitals:   07/08/19 1410 07/08/19 1415  07/08/19 1420 07/08/19 1425  BP: 123/62 (!) 123/57 (!) 117/52 (!) 117/54  Pulse: (!) 122 (!) 157 (!) 127 (!) 116  Resp: 15 14 (!) 31 13  Temp:      TempSrc:      SpO2: 100% 100% 98% 98%  Weight:      Height:       No intake or output data in the 24 hours ending 07/08/19 1615 Filed Weights   07/07/19 1826 07/07/19 1837  Weight: 71.6 kg 71.6 kg    Gen: 79 y.o. female in no distress  Pulm: Non-labored breathing. Clear to auscultation bilaterally.  CV: Regular rate and rhythm. No murmur, rub, or gallop. + JVD, + pedal edema. GI: Abdomen soft, non-tender, non-distended, with normoactive bowel sounds. No organomegaly or masses felt. Ext: Warm, no deformities Skin: Left lateral chest wall with significant bandaging with discharge, nontender.  Neuro: Alert and oriented. No focal neurological deficits. Psych: Judgement and insight appear normal. Mood & affect appropriate.   Data Reviewed: I have personally reviewed following labs and imaging studies  CBC: Recent Labs  Lab 07/07/19 1854 07/08/19 0244 07/08/19 1334 07/08/19 1343  WBC 6.2 5.1  --   --   HGB 8.9* 7.5* 7.5* 8.2*  HCT 28.5* 23.8* 22.0* 24.0*  MCV 86.4 86.5  --   --   PLT 254 219  --   --    Basic Metabolic Panel: Recent Labs  Lab 07/07/19 1854 07/08/19 0244 07/08/19 1334 07/08/19 1343  NA  --  141 147* 143  K  --  3.6 3.0* 3.6  CL  --  102  --   --  CO2  --  33*  --   --   GLUCOSE  --  78  --   --   BUN  --  8  --   --   CREATININE 0.53 0.53  --   --   CALCIUM  --  7.9*  --   --    GFR: Estimated Creatinine Clearance: 52.8 mL/min (by C-G formula based on SCr of 0.53 mg/dL). Liver Function Tests: No results for input(s): AST, ALT, ALKPHOS, BILITOT, PROT, ALBUMIN in the last 168 hours. No results for input(s): LIPASE, AMYLASE in the last 168 hours. No results for input(s): AMMONIA in the last 168 hours. Coagulation Profile: No results for input(s): INR, PROTIME in the last 168 hours. Cardiac  Enzymes: No results for input(s): CKTOTAL, CKMB, CKMBINDEX, TROPONINI in the last 168 hours. BNP (last 3 results) No results for input(s): PROBNP in the last 8760 hours. HbA1C: No results for input(s): HGBA1C in the last 72 hours. CBG: No results for input(s): GLUCAP in the last 168 hours. Lipid Profile: No results for input(s): CHOL, HDL, LDLCALC, TRIG, CHOLHDL, LDLDIRECT in the last 72 hours. Thyroid Function Tests: No results for input(s): TSH, T4TOTAL, FREET4, T3FREE, THYROIDAB in the last 72 hours. Anemia Panel: No results for input(s): VITAMINB12, FOLATE, FERRITIN, TIBC, IRON, RETICCTPCT in the last 72 hours. Urine analysis: No results found for: COLORURINE, APPEARANCEUR, LABSPEC, PHURINE, GLUCOSEU, HGBUR, BILIRUBINUR, KETONESUR, PROTEINUR, UROBILINOGEN, NITRITE, LEUKOCYTESUR Recent Results (from the past 240 hour(s))  Respiratory Panel by RT PCR (Flu A&B, Covid) - Nasopharyngeal Swab     Status: None   Collection Time: 07/08/19 10:40 AM   Specimen: Nasopharyngeal Swab  Result Value Ref Range Status   SARS Coronavirus 2 by RT PCR NEGATIVE NEGATIVE Final    Comment: (NOTE) SARS-CoV-2 target nucleic acids are NOT DETECTED. The SARS-CoV-2 RNA is generally detectable in upper respiratoy specimens during the acute phase of infection. The lowest concentration of SARS-CoV-2 viral copies this assay can detect is 131 copies/mL. A negative result does not preclude SARS-Cov-2 infection and should not be used as the sole basis for treatment or other patient management decisions. A negative result may occur with  improper specimen collection/handling, submission of specimen other than nasopharyngeal swab, presence of viral mutation(s) within the areas targeted by this assay, and inadequate number of viral copies (<131 copies/mL). A negative result must be combined with clinical observations, patient history, and epidemiological information. The expected result is Negative. Fact Sheet for  Patients:  PinkCheek.be Fact Sheet for Healthcare Providers:  GravelBags.it This test is not yet ap proved or cleared by the [place] FDA and  has been authorized for detection and/or diagnosis of SARS-CoV-2 by FDA under an Emergency Use Authorization (EUA). This EUA will remain  in effect (meaning this test can be used) for the duration of the COVID-19 declaration under Section 564(b)(1) of the Act, 21 U.S.C. section 360bbb-3(b)(1), unless the authorization is terminated or revoked sooner.    Influenza A by PCR NEGATIVE NEGATIVE Final   Influenza B by PCR NEGATIVE NEGATIVE Final    Comment: (NOTE) The Xpert Xpress SARS-CoV-2/FLU/RSV assay is intended as an aid in  the diagnosis of influenza from Nasopharyngeal swab specimens and  should not be used as a sole basis for treatment. Nasal washings and  aspirates are unacceptable for Xpert Xpress SARS-CoV-2/FLU/RSV  testing. Fact Sheet for Patients: PinkCheek.be Fact Sheet for Healthcare Providers: GravelBags.it This test is not yet approved or cleared by the [place] FDA  and  has been authorized for detection and/or diagnosis of SARS-CoV-2 by  FDA under an Emergency Use Authorization (EUA). This EUA will remain  in effect (meaning this test can be used) for the duration of the  Covid-19 declaration under Section 564(b)(1) of the Act, 21  U.S.C. section 360bbb-3(b)(1), unless the authorization is  terminated or revoked. Performed at [facility], [place] [address] [place],  [address]       Radiology Studies: CARDIAC CATHETERIZATION  Result Date: 07/08/2019  Prox Cx to Mid Cx lesion is 20% stenosed.  Findings: Ao = 104/46 (72) LV =  90/14 RA =  7 RV = 47/8 PA = 50/9 (21) PCW = 19 Fick cardiac output/index = 8.8/5.2 PVR = < 1.0 WU Ao sat = 100 PA sat = 74%, 75% Assessment: 1. LVEF 60-65% 2. Minimal  CAD 3. Very mild PAH Plan/Discussion: Ok to proceed with thoracotomy for very large left-sided empyema. Glori Bickers, MD 3:22 PM    Scheduled Meds: . [MAR Hold] celecoxib  200 mg Oral BID  . [MAR Hold] docusate sodium  100 mg Oral BID  . [MAR Hold] enoxaparin (LOVENOX) injection  40 mg Subcutaneous Q24H  . [MAR Hold] losartan  25 mg Oral Daily  . [MAR Hold] metoprolol succinate  12.5 mg Oral Daily  . [MAR Hold] mometasone-formoterol  2 puff Inhalation BID  . [MAR Hold] montelukast  10 mg Oral Daily  . [MAR Hold] pantoprazole  40 mg Oral Daily  . vancomycin 1000 mg in NS (1000 ml) irrigation for Dr. Roxy Manns case   Irrigation To OR   Continuous Infusions: . sodium chloride Stopped (07/08/19 0846)  . sodium chloride 10 mL/hr at 07/08/19 0845  . sodium chloride    . [MAR Hold] piperacillin-tazobactam (ZOSYN)  IV 3.375 g (07/08/19 0448)  . [MAR Hold] vancomycin       LOS: 1 day   Time spent: 35 minutes.  Patrecia Pour, MD [facility] [email or web address] 07/08/2019, 4:15 PM

## 2019-07-08 NOTE — Progress Notes (Signed)
Reviewed patient records from [facility], they did a POC rapid Covid test on this patient and also do a send out covid test with results expected tomorrow.  The rapid POC test was negative, unfortunately we don't except rapid POC covid test for cath lab procedures.  Instructed the nurse that they will need to collect Covid swab to be send to lab,  We will not be able to do cath until results are back unless patient becomes unstable

## 2019-07-08 NOTE — H&P (View-Only) (Signed)
Brief note: (full consult note pending)  79 y/o woman with h/o "pulmonary HTN" diagnosed in 1999 in [place]. Developed bronchitis in September 2020 with several relapses. Now admitted with extensive L-sided empyema eroding through chest wall.   Very limited functionall capacity at baseline.   Needs pre-op eval  Echo EF 60% RV mildly HK with RVSP 50-60 range    D/w Dr. Orvan Seen. Will do R/L cath today in preparation for major thoracotomy to remove very large empyema.   Glori Bickers, MD  12:09 PM

## 2019-07-08 NOTE — Anesthesia Procedure Notes (Signed)
Procedure Name: Intubation Date/Time: 07/08/2019 3:00 PM Performed by: Mariea Clonts, CRNA Pre-anesthesia Checklist: Patient identified, Emergency Drugs available, Suction available and Patient being monitored Patient Re-evaluated:Patient Re-evaluated prior to induction Oxygen Delivery Method: Circle System Utilized Preoxygenation: Pre-oxygenation with 100% oxygen Induction Type: IV induction and Cricoid Pressure applied Ventilation: Mask ventilation without difficulty and Oral airway inserted - appropriate to patient size Laryngoscope Size: Mac and 3 Grade View: Grade II Tube type: Oral Endobronchial tube: Double lumen EBT, Left, EBT position confirmed by fiberoptic bronchoscope and EBT position confirmed by auscultation and 35 Fr Number of attempts: 1 Airway Equipment and Method: Stylet and Oral airway Placement Confirmation: ETT inserted through vocal cords under direct vision,  positive ETCO2 and breath sounds checked- equal and bilateral Tube secured with: Tape Dental Injury: Teeth and Oropharynx as per pre-operative assessment

## 2019-07-08 NOTE — Anesthesia Procedure Notes (Signed)
Procedure Name: Intubation Date/Time: 07/08/2019 5:23 PM Performed by: Oletta Lamas, CRNA Pre-anesthesia Checklist: Patient being monitored, Suction available and Emergency Drugs available Oxygen Delivery Method: Circle System Utilized Preoxygenation: Pre-oxygenation with 100% oxygen Tube type: Oral Tube size: 7.5 mm Number of attempts: 1 Airway Equipment and Method: Bougie stylet Placement Confirmation: ETT inserted through vocal cords under direct vision,  positive ETCO2 and breath sounds checked- equal and bilateral Secured at: 21 cm Tube secured with: Tape Dental Injury: Teeth and Oropharynx as per pre-operative assessment  Comments: 8.5 tube exchanged with ease for 7.5 ETT by DJ MDA.  Tolerated well

## 2019-07-08 NOTE — Progress Notes (Signed)
Patient ID: Connie Jarvis, female   DOB: 1941-01-20, 79 y.o.   MRN: [id number] TCTS Evening Rounds:  Just back from OR. Asleep on vent.  BP 70's but getting some albumin with calcium.  Postop labs pending. May need some blood.  Wound VAC output low. Chest tube has now output, no air leak.

## 2019-07-08 NOTE — Progress Notes (Signed)
The chaplain visited as a result of a consult for an Advanced Directive (AD). The patient received the paperwork but shortly after the visit started a physician came in and discussed a procedure that would take place before the AD could be completed. The Chaplain provided information concerning [place]. The chaplain thin offered supportive conversation and prayer. The chaplain will follow-up later.  Brion Aliment Chaplain Resident For questions concerning this note please contact me by pager [phone]

## 2019-07-08 NOTE — Progress Notes (Signed)
Notified RN that order read for RT to collect. RN states that it was intended for a nurse collect. Informed RN I would notify charge pt need a collect. Request swabs from lab for pt. Swabs arrived, charge made aware.

## 2019-07-08 NOTE — Anesthesia Preprocedure Evaluation (Addendum)
Anesthesia Evaluation  Patient identified by MRN, date of birth, ID band Patient awake    Reviewed: Allergy & Precautions, NPO status , Patient's Chart, lab work & pertinent test results  Airway Mallampati: II  TM Distance: <3 FB Neck ROM: Full    Dental  (+) Teeth Intact, Dental Advisory Given   Pulmonary    + rhonchi  + decreased breath sounds+ wheezing      Cardiovascular hypertension,  Rhythm:Regular Rate:Normal     Neuro/Psych    GI/Hepatic   Endo/Other    Renal/GU      Musculoskeletal   Abdominal   Peds  Hematology   Anesthesia Other Findings   Reproductive/Obstetrics                            Anesthesia Physical Anesthesia Plan  ASA: III  Anesthesia Plan: General   Post-op Pain Management:    Induction: Intravenous  PONV Risk Score and Plan: Ondansetron and Dexamethasone  Airway Management Planned: Oral ETT  Additional Equipment: Arterial line and CVP  Intra-op Plan:   Post-operative Plan: Possible Post-op intubation/ventilation  Informed Consent: I have reviewed the patients History and Physical, chart, labs and discussed the procedure including the risks, benefits and alternatives for the proposed anesthesia with the patient or authorized representative who has indicated his/her understanding and acceptance.     Dental advisory given  Plan Discussed with: CRNA and Anesthesiologist  Anesthesia Plan Comments:         Anesthesia Quick Evaluation

## 2019-07-08 NOTE — Anesthesia Postprocedure Evaluation (Signed)
Anesthesia Post Note  Patient: Connie Jarvis  Procedure(s) Performed: THORACOTOMY MAJOR (Left ) EMPYEMA DRAINAGE (Left ) APPLICATION OF WOUND VAC (Left )     Patient location during evaluation: SICU Anesthesia Type: General Level of consciousness: sedated and patient remains intubated per anesthesia plan Pain management: pain level controlled Vital Signs Assessment: post-procedure vital signs reviewed and stable Respiratory status: patient remains intubated per anesthesia plan and patient on ventilator - see flowsheet for VS Cardiovascular status: stable Postop Assessment: no apparent nausea or vomiting Anesthetic complications: no    Last Vitals:  Vitals:   07/08/19 1900 07/08/19 1923  BP: 110/63   Pulse: 63   Resp: 16   Temp: (!) 35.3 C   SpO2: 100% 99%    Last Pain:  Vitals:   07/08/19 1422  TempSrc:   PainSc: 0-No pain                 Daena Alper COKER

## 2019-07-08 NOTE — Progress Notes (Signed)
  Echocardiogram 2D Echocardiogram has been performed.  Connie Jarvis M 07/08/2019, 10:12 AM

## 2019-07-08 NOTE — Brief Op Note (Signed)
07/07/2019 - 07/08/2019  5:12 PM  PATIENT:  Connie Jarvis  79 y.o. female  PRE-OPERATIVE DIAGNOSIS:  LEFT EMPYEMA  POST-OPERATIVE DIAGNOSIS:  LEFT EMPYEMA  PROCEDURE:  Procedure(s):  THORACOTOMY MAJOR (Left) RESECTION OF 6th RIB EMPYEMA DRAINAGE (Left) APPLICATION OF WOUND VAC (Left)  SURGEON:  Surgeon(s) and Role:    * Wonda Olds, MD - Primary  PHYSICIAN ASSISTANT: Ellwood Handler PA-C  ANESTHESIA:   general  EBL:  450 mL   BLOOD ADMINISTERED:2 units CC PRBC  DRAINS: 32 Chest tube left chest   LOCAL MEDICATIONS USED:  NONE  SPECIMEN:  Source of Specimen:  Pleural Fluid, 6th Rib  DISPOSITION OF SPECIMEN:  Microbiology, Pathology  COUNTS:  YES  TOURNIQUET:  * No tourniquets in log *  DICTATION: .Dragon Dictation  PLAN OF CARE: Admit to inpatient   PATIENT DISPOSITION:  ICU - intubated and hemodynamically stable.   Delay start of Pharmacological VTE agent (>24hrs) due to surgical blood loss or risk of bleeding: yes

## 2019-07-08 NOTE — Transfer of Care (Signed)
Immediate Anesthesia Transfer of Care Note  Patient: Connie Jarvis  Procedure(s) Performed: THORACOTOMY MAJOR (Left ) EMPYEMA DRAINAGE (Left ) APPLICATION OF WOUND VAC (Left )  Patient Location: ICU  Anesthesia Type:General  Level of Consciousness: sedated and Patient remains intubated per anesthesia plan  Airway & Oxygen Therapy: Patient remains intubated per anesthesia plan and Patient placed on Ventilator (see vital sign flow sheet for setting)  Post-op Assessment: Report given to RN and Post -op Vital signs reviewed and stable  Post vital signs: Reviewed and stable  Last Vitals:  Vitals Value Taken Time  BP    Temp    Pulse 69 07/08/19 1756  Resp 16 07/08/19 1756  SpO2 100 % 07/08/19 1756  Vitals shown include unvalidated device data.  Last Pain:  Vitals:   07/08/19 1422  TempSrc:   PainSc: 0-No pain         Complications: No apparent anesthesia complications

## 2019-07-08 NOTE — Interval H&P Note (Signed)
History and Physical Interval Note:  07/08/2019 12:13 PM  Connie Jarvis  has presented today for surgery, with the diagnosis of heart failure.  The various methods of treatment have been discussed with the patient and family. After consideration of risks, benefits and other options for treatment, the patient has consented to  Procedure(s): RIGHT/LEFT HEART CATH AND CORONARY ANGIOGRAPHY (N/A) as a surgical intervention.  The patient's history has been reviewed, patient examined, no change in status, stable for surgery.  I have reviewed the patient's chart and labs.  Questions were answered to the patient's satisfaction.     Christan Ciccarelli

## 2019-07-08 NOTE — Progress Notes (Signed)
Per Dr. Vance Gather  for Covid-19 "antigen was negative at [facility]".

## 2019-07-08 NOTE — Progress Notes (Signed)
Attempt to call cath lab to update on pt results.

## 2019-07-08 NOTE — Consult Note (Signed)
Advanced Heart Failure Team Consult Note   Primary Physician: Ronita Hipps, MD PCP-Cardiologist:  No primary care provider on file.   Referring: Dr. Orvan Seen  Reason for Consultation: [facility] and pre-op eval  HPI:    Connie Jarvis is seen today for evaluation of [facility] bnd pre-op eval at the request of [facility].   79 y/o woman with h/o pulmonary HTN, PAF, arthritis, HTN. Tells me she was diagnosed with PAH in 2019 in [place] has been on sildenafil. Also has prescriptions apparently for macitentan and selexipag but not taking due to cost. Developed bronchitis in September 2020 with several relapses. Now admitted with extensive L-sided empyema eroding through chest wall. Has been seen by Dr. Orvan Seen and planning large thoracotomy to remove.  Reports very limited functionall capacity at baseline for at least the past year. Can get around the house but is limited. Uses scooter if she goes out. Mild LE edema. No CP, orthopnea or PND. No syncope   Echo here  EF 60% RV mildly HK with RVSP 50-60 range     Review of Systems: [y] = yes, [ ]  = no   . General: Weight gain [ ] ; Weight loss [ ] ; Anorexia [ ] ; Fatigue Blue.Reese ]; Fever [ ] ; Chills [ ] ; Weakness Blue.Reese ]  . Cardiac: Chest pain/pressure [ ] ; Resting SOB [ ] ; Exertional SOB Blue.Reese ]; Orthopnea [ ] ; Pedal Edema Blue.Reese ]; Palpitations [ ] ; Syncope [ ] ; Presyncope [ ] ; Paroxysmal nocturnal dyspnea[ ]   . Pulmonary: Cough [ y]; Wheezing[ ] ; Hemoptysis[ ] ; Sputum [ ] ; Snoring [ ]   . GI: Vomiting[ ] ; Dysphagia[ ] ; Melena[ ] ; Hematochezia [ ] ; Heartburn[ ] ; Abdominal pain [ ] ; Constipation [ ] ; Diarrhea [ ] ; BRBPR [ ]   . GU: Hematuria[ ] ; Dysuria [ ] ; Nocturia[ ]   . Vascular: Pain in legs with walking [ ] ; Pain in feet with lying flat [ ] ; Non-healing sores [ ] ; Stroke [ ] ; TIA [ ] ; Slurred speech [ ] ;  . Neuro: Headaches[ ] ; Vertigo[ ] ; Seizures[ ] ; Paresthesias[ ] ;Blurred vision [ ] ; Diplopia [ ] ; Vision changes [ ]   . Ortho/Skin: Arthritis [ y]; Joint pain [ y]; Muscle  pain [ ] ; Joint swelling [ ] ; Back Pain [ ] ; Rash [ ]   . Psych: Depression[y ]; Anxiety[ y]  . Heme: Bleeding problems [ ] ; Clotting disorders [ ] ; Anemia [ ]   . Endocrine: Diabetes [ ] ; Thyroid dysfunction[ ]   Home Medications Prior to Admission medications   Medication Sig Start Date End Date Taking? Authorizing Provider  acetaminophen (TYLENOL) 500 MG tablet Take 500 mg by mouth every 6 (six) hours as needed for mild pain, fever or headache.    Yes [provider]  aspirin EC 81 MG tablet Take 81 mg by mouth daily.   Yes [provider]  b complex vitamins capsule Take 1 capsule by mouth daily.   Yes [provider]  benzonatate (TESSALON) 200 MG capsule Take 200 mg by mouth 3 (three) times daily as needed for cough.   Yes [provider]  budesonide-formoterol (SYMBICORT) 80-4.5 MCG/ACT inhaler Inhale 2 puffs into the lungs 2 (two) times daily. 06/03/19  Yes [provider]  Calcium Carbonate-Vitamin D (CALTRATE 600+D) 600-400 MG-UNIT tablet Take 1 tablet by mouth daily.   Yes [provider]  Cholecalciferol (VITAMIN D3) 10000 units TABS Take 1 tablet by mouth 2 (two) times daily.   Yes [provider]  clindamycin (CLEOCIN) 300 MG capsule  Take 300 mg by mouth 2 (two) times daily. 07/01/19  Yes [provider]  Glucosamine-Chondroit-Vit C-Mn (GLUCOSAMINE-CHONDROITIN MAX ST) CAPS Take 1 capsule by mouth daily.    Yes [provider]  macitentan (OPSUMIT) 10 MG tablet Take 10 mg by mouth daily.   Yes [provider]  metoprolol succinate (TOPROL-XL) 25 MG 24 hr tablet Take 25 mg by mouth daily.  06/26/19  Yes [provider]  Multiple Vitamin (MULTIVITAMIN) tablet Take 1 tablet by mouth daily.   Yes [provider]  Omega-3 Fatty Acids (FISH OIL) 1000 MG CAPS Take 1 capsule by mouth 3 (three) times daily.    Yes [provider]  omeprazole (PRILOSEC) 20 MG capsule Take 20 mg by  mouth daily.   Yes [provider]  sildenafil (REVATIO) 20 MG tablet Take 20 mg by mouth 3 (three) times daily.   Yes [provider]  albuterol (VENTOLIN HFA) 108 (90 Base) MCG/ACT inhaler Inhale 2 puffs into the lungs every 4 (four) hours as needed for wheezing or shortness of breath.    [provider]  celecoxib (CELEBREX) 200 MG capsule Take 200 mg by mouth 2 (two) times daily. 09/02/17   [provider]  losartan (COZAAR) 25 MG tablet Take 25 mg by mouth daily. 07/17/17   [provider]  montelukast (SINGULAIR) 10 MG tablet Take 10 mg by mouth daily. 06/28/19   [provider]  valACYclovir (VALTREX) 1000 MG tablet Take 1,000 mg by mouth as needed.    [provider]    Past Medical History: Past Medical History:  Diagnosis Date  . Allergy   . Anemia   . Anxiety   . Arthritis   . Cataract    pt. not sure which eye early stage  . Depression   . GERD (gastroesophageal reflux disease)   . Hypertension     Past Surgical History: Past Surgical History:  Procedure Laterality Date  . COLONOSCOPY    . MOUTH SURGERY    . TONSILLECTOMY    . TUBAL LIGATION      Family History: Family History  Problem Relation Age of Onset  . Colon cancer Neg Hx   . Colon polyps Neg Hx   . Esophageal cancer Neg Hx   . Pancreatic cancer Neg Hx   . Rectal cancer Neg Hx   . Stomach cancer Neg Hx     Social History: Social History   Socioeconomic History  . Marital status: Married    Spouse name: Not on file  . Number of children: Not on file  . Years of education: Not on file  . Highest education level: Not on file  Occupational History  . Not on file  Tobacco Use  . Smoking status: Never Smoker  . Smokeless tobacco: Never Used  Substance and Sexual Activity  . Alcohol use: Never  . Drug use: Never  . Sexual activity: Not on file  Other Topics Concern  . Not on file  Social History Narrative  . Not on file   Social  Determinants of Health   Financial Resource Strain:   . Difficulty of Paying Living Expenses: Not on file  Food Insecurity:   . Worried About Charity fundraiser in the Last Year: Not on file  . Ran Out of Food in the Last Year: Not on file  Transportation Needs:   . Lack of Transportation (Medical): Not on file  . Lack of Transportation (Non-Medical): Not on file  Physical Activity:   . Days of Exercise per Week: Not on file  . Minutes of Exercise per Session: Not on file  Stress:   . Feeling of Stress : Not on file  Social Connections:   . Frequency of Communication with Friends and Family: Not on file  . Frequency of Social Gatherings with Friends and Family: Not on file  . Attends Religious Services: Not on file  . Active Member of Clubs or Organizations: Not on file  . Attends Archivist Meetings: Not on file  . Marital Status: Not on file    Allergies:  Allergies  Allergen Reactions  . Codeine Nausea And Vomiting  . Gluten Meal Diarrhea    Abdominal pain and bloating  . Lactose Diarrhea    Bloating and abdominal pain  . Neosporin [Neomycin-Bacitracin Zn-Polymyx]   . Other     Pain medication pt. Cannot remember the name.    Objective:    Vital Signs:   Temp:  [97.6 F (36.4 C)] 97.6 F (36.4 C) (02/11 0749) Pulse Rate:  [0-295] 116 (02/11 1425) Resp:  [4-41] 13 (02/11 1425) BP: (88-124)/(50-74) 117/54 (02/11 1425) SpO2:  [0 %-100 %] 98 % (02/11 1425) Weight:  [71.6 kg] 71.6 kg (02/10 1837)    Weight change: Filed Weights   07/07/19 1826 07/07/19 1837  Weight: 71.6 kg 71.6 kg    Intake/Output:  No intake or output data in the 24 hours ending 07/08/19 1607    Physical Exam    General:  Elderly weak appearing. No resp difficulty HEENT: normal Neck: supple. JVP 6 . Carotids 2+ bilat; no bruits. No lymphadenopathy or thyromegaly appreciated. Cor: PMI nondisplaced. Irregular rate & rhythm. No rubs, gallops or murmurs. Lungs: clear decreased  on left with very large draining abscess under R breast Abdomen: soft, nontender, nondistended. No hepatosplenomegaly. No bruits or masses. Good bowel sounds. Extremities: no cyanosis, clubbing, rash, 1+ edema Neuro: alert & orientedx3, cranial nerves grossly intact. moves all 4 extremities w/o difficulty. Affect pleasant   Telemetry   Apears sinus with frequent PACs vs AF 90-100 Personally reviewed   EKG    Pending  Labs   Basic Metabolic Panel: Recent Labs  Lab 07/07/19 1854 07/08/19 0244 07/08/19 1334 07/08/19 1343  NA  --  141 147* 143  K  --  3.6 3.0* 3.6  CL  --  102  --   --   CO2  --  33*  --   --   GLUCOSE  --  78  --   --   BUN  --  8  --   --   CREATININE 0.53 0.53  --   --   CALCIUM  --  7.9*  --   --     Liver Function Tests: No results for input(s): AST, ALT, ALKPHOS, BILITOT, PROT, ALBUMIN in the last 168 hours. No results for input(s): LIPASE, AMYLASE in the last 168 hours. No results for input(s): AMMONIA in the last 168 hours.  CBC: Recent Labs  Lab 07/07/19 1854 07/08/19 0244 07/08/19 1334 07/08/19 1343  WBC 6.2 5.1  --   --   HGB 8.9* 7.5* 7.5* 8.2*  HCT 28.5* 23.8* 22.0* 24.0*  MCV 86.4 86.5  --   --   PLT 254 219  --   --     Cardiac Enzymes: No results for input(s): CKTOTAL, CKMB, CKMBINDEX, TROPONINI in the last 168 hours.  BNP: BNP (last 3 results) No results for input(s):  BNP in the last 8760 hours.  ProBNP (last 3 results) No results for input(s): PROBNP in the last 8760 hours.   CBG: No results for input(s): GLUCAP in the last 168 hours.  Coagulation Studies: No results for input(s): LABPROT, INR in the last 72 hours.   Imaging   CARDIAC CATHETERIZATION  Result Date: 07/08/2019  Prox Cx to Mid Cx lesion is 20% stenosed.  Findings: Ao = 104/46 (72) LV =  90/14 RA =  7 RV = 47/8 PA = 50/9 (21) PCW = 19 Fick cardiac output/index = 8.8/5.2 PVR = < 1.0 WU Ao sat = 100 PA sat = 74%, 75% Assessment: 1. LVEF 60-65% 2.  Minimal CAD 3. Very mild PAH Plan/Discussion: Ok to proceed with thoracotomy for very large left-sided empyema. Glori Bickers, MD 3:22 PM      Medications:     Current Medications: . [MAR Hold] celecoxib  200 mg Oral BID  . [MAR Hold] docusate sodium  100 mg Oral BID  . [MAR Hold] enoxaparin (LOVENOX) injection  40 mg Subcutaneous Q24H  . [MAR Hold] losartan  25 mg Oral Daily  . [MAR Hold] metoprolol succinate  12.5 mg Oral Daily  . [MAR Hold] mometasone-formoterol  2 puff Inhalation BID  . [MAR Hold] montelukast  10 mg Oral Daily  . [MAR Hold] pantoprazole  40 mg Oral Daily  . vancomycin 1000 mg in NS (1000 ml) irrigation for Dr. Roxy Manns case   Irrigation To OR     Infusions: . sodium chloride Stopped (07/08/19 0846)  . sodium chloride 10 mL/hr at 07/08/19 0845  . sodium chloride    . [MAR Hold] piperacillin-tazobactam (ZOSYN)  IV 3.375 g (07/08/19 0448)  . 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  - for OR with Dr. Orvan Seen today  2. PAH - R/L cath today with very mild PAH and no CAD - continue sildenafil - OK for OR  3. Pre-op CV risk assessment - very limited functional capacity. Given h/o PAH and need for large chest surgery we undertook R/L cath today -> very mild PAH and no CAD - D/w Dr. Orvan Seen and Dr. Linna Caprice. [place] for OR  4. PAF - follow post-op    Length of Stay: 1  Glori Bickers, MD  07/08/2019, 4:07 PM  Advanced Heart Failure Team Pager [phone] (M-F; 7a - 4p)  Please contact [facility] for night-coverage after hours (4p -7a ) and weekends on [email or web address]

## 2019-07-08 NOTE — Consult Note (Addendum)
Brief note: (full consult note pending)  79 y/o woman with h/o "pulmonary HTN" diagnosed in 2019 in [place] has been on sildenafil. Developed bronchitis in September 2020 with several relapses. Now admitted with extensive L-sided empyema eroding through chest wall.   Very limited functionall capacity at baseline.   Needs pre-op eval  Echo EF 60% RV mildly HK with RVSP 50-60 range    D/w Dr. Orvan Seen. Will do R/L cath today in preparation for major thoracotomy to remove very large empyema.   Glori Bickers, MD  12:09 PM

## 2019-07-08 NOTE — Anesthesia Procedure Notes (Signed)
Procedure Name: Intubation Date/Time: 07/08/2019 3:17 PM Performed by: Mariea Clonts, CRNA Pre-anesthesia Checklist: Patient identified, Emergency Drugs available, Suction available and Patient being monitored Patient Re-evaluated:Patient Re-evaluated prior to induction Oxygen Delivery Method: Circle System Utilized Preoxygenation: Pre-oxygenation with 100% oxygen Tube type: Oral Endobronchial tube: Bronchial Blocker placed under direct vision Tube size: 8.5 mm Number of attempts: 1 Airway Equipment and Method: Stylet and Oral airway Placement Confirmation: ETT inserted through vocal cords under direct vision,  positive ETCO2 and breath sounds checked- equal and bilateral Tube secured with: Tape Dental Injury: Teeth and Oropharynx as per pre-operative assessment

## 2019-07-09 ENCOUNTER — Inpatient Hospital Stay (HOSPITAL_COMMUNITY): Payer: Medicare Other

## 2019-07-09 DIAGNOSIS — I480 Paroxysmal atrial fibrillation: Secondary | ICD-10-CM

## 2019-07-09 LAB — POCT I-STAT 7, (LYTES, BLD GAS, ICA,H+H)
Acid-Base Excess: 3 mmol/L — ABNORMAL HIGH (ref 0.0–2.0)
Bicarbonate: 28.5 mmol/L — ABNORMAL HIGH (ref 20.0–28.0)
Calcium, Ion: 1.17 mmol/L (ref 1.15–1.40)
HCT: 30 % — ABNORMAL LOW (ref 36.0–46.0)
Hemoglobin: 10.2 g/dL — ABNORMAL LOW (ref 12.0–15.0)
O2 Saturation: 99 %
Patient temperature: 37.3
Potassium: 4 mmol/L (ref 3.5–5.1)
Sodium: 142 mmol/L (ref 135–145)
TCO2: 30 mmol/L (ref 22–32)
pCO2 arterial: 47.2 mmHg (ref 32.0–48.0)
pH, Arterial: 7.391 (ref 7.350–7.450)
pO2, Arterial: 120 mmHg — ABNORMAL HIGH (ref 83.0–108.0)

## 2019-07-09 LAB — CBC
HCT: 30.6 % — ABNORMAL LOW (ref 36.0–46.0)
Hemoglobin: 9.9 g/dL — ABNORMAL LOW (ref 12.0–15.0)
MCH: 28.4 pg (ref 26.0–34.0)
MCHC: 32.4 g/dL (ref 30.0–36.0)
MCV: 87.7 fL (ref 80.0–100.0)
Platelets: 248 10*3/uL (ref 150–400)
RBC: 3.49 MIL/uL — ABNORMAL LOW (ref 3.87–5.11)
RDW: 15.5 % (ref 11.5–15.5)
WBC: 12.1 10*3/uL — ABNORMAL HIGH (ref 4.0–10.5)
nRBC: 0 % (ref 0.0–0.2)

## 2019-07-09 LAB — BASIC METABOLIC PANEL
Anion gap: 11 (ref 5–15)
BUN: 8 mg/dL (ref 8–23)
CO2: 26 mmol/L (ref 22–32)
Calcium: 7.7 mg/dL — ABNORMAL LOW (ref 8.9–10.3)
Chloride: 106 mmol/L (ref 98–111)
Creatinine, Ser: 0.79 mg/dL (ref 0.44–1.00)
GFR calc Af Amer: >60 mL/min (ref >60)
GFR calc non Af Amer: >60 mL/min (ref >60)
Glucose, Bld: 67 mg/dL — ABNORMAL LOW (ref 70–99)
Potassium: 3.4 mmol/L — ABNORMAL LOW (ref 3.5–5.1)
Sodium: 143 mmol/L (ref 135–145)

## 2019-07-09 LAB — GLUCOSE, CAPILLARY: Glucose-Capillary: 93 mg/dL (ref 70–99)

## 2019-07-09 LAB — MAGNESIUM: Magnesium: 1.2 mg/dL — ABNORMAL LOW (ref 1.7–2.4)

## 2019-07-09 LAB — MRSA PCR SCREENING: MRSA by PCR: NEGATIVE

## 2019-07-09 MED ORDER — ORAL CARE MOUTH RINSE
15.0000 mL | OROMUCOSAL | Status: DC
  Administered 2019-07-09 – 2019-07-10 (×14): 15 mL via OROMUCOSAL

## 2019-07-09 MED ORDER — POTASSIUM CHLORIDE 10 MEQ/50ML IV SOLN
10.0000 meq | INTRAVENOUS | Status: AC
  Administered 2019-07-10 (×3): 10 meq via INTRAVENOUS

## 2019-07-09 MED ORDER — ACETAMINOPHEN 10 MG/ML IV SOLN
1000.0000 mg | Freq: Once | INTRAVENOUS | Status: AC
  Filled 2019-07-09: qty 100

## 2019-07-09 MED ORDER — MONTELUKAST SODIUM 10 MG PO TABS
10.0000 mg | ORAL_TABLET | Freq: Every day | ORAL | Status: DC
  Administered 2019-07-09 – 2019-07-24 (×14): 10 mg
  Filled 2019-07-09 (×14): qty 1

## 2019-07-09 MED ORDER — HEPARIN (PORCINE) 25000 UT/250ML-% IV SOLN
1200.0000 [IU]/h | INTRAVENOUS | Status: DC
  Administered 2019-07-09: 750 [IU]/h via INTRAVENOUS
  Administered 2019-07-10: 18:00:00 1050 [IU]/h via INTRAVENOUS
  Filled 2019-07-09 (×2): qty 250

## 2019-07-09 MED ORDER — FUROSEMIDE 10 MG/ML IJ SOLN
20.0000 mg | Freq: Once | INTRAMUSCULAR | Status: AC
  Administered 2019-07-09: 18:00:00 20 mg via INTRAVENOUS
  Filled 2019-07-09: qty 2

## 2019-07-09 MED ORDER — METOPROLOL TARTRATE 5 MG/5ML IV SOLN
2.5000 mg | Freq: Once | INTRAVENOUS | Status: AC
  Administered 2019-07-09: 22:00:00 2.5 mg via INTRAVENOUS
  Filled 2019-07-09: qty 5

## 2019-07-09 MED ORDER — LOSARTAN POTASSIUM 25 MG PO TABS: 25.0000 mg | ORAL_TABLET | Freq: Every day | ORAL | Status: DC

## 2019-07-09 MED ORDER — SODIUM CHLORIDE 0.9% FLUSH: 3.0000 mL | INTRAVENOUS | Status: DC | PRN

## 2019-07-09 MED ORDER — CEFAZOLIN SODIUM-DEXTROSE 2-4 GM/100ML-% IV SOLN: 2.0000 g | INTRAVENOUS | Status: DC

## 2019-07-09 MED ORDER — PANTOPRAZOLE SODIUM 40 MG PO PACK
40.0000 mg | PACK | Freq: Every day | ORAL | Status: DC
  Administered 2019-07-09: 40 mg
  Filled 2019-07-09 (×2): qty 20

## 2019-07-09 MED ORDER — METOPROLOL TARTRATE 5 MG/5ML IV SOLN: 2.5000 mg | Freq: Three times a day (TID) | INTRAVENOUS | Status: DC | PRN

## 2019-07-09 MED ORDER — CHLORHEXIDINE GLUCONATE 0.12% ORAL RINSE (MEDLINE KIT)
15.0000 mL | Freq: Two times a day (BID) | OROMUCOSAL | Status: DC
  Administered 2019-07-09 – 2019-07-23 (×24): 15 mL via OROMUCOSAL
  Filled 2019-07-09: qty 15

## 2019-07-09 MED ORDER — METOPROLOL TARTRATE 25 MG/10 ML ORAL SUSPENSION: 12.5000 mg | Freq: Two times a day (BID) | ORAL | Status: DC

## 2019-07-09 MED ORDER — LEVALBUTEROL HCL 0.63 MG/3ML IN NEBU
0.6300 mg | INHALATION_SOLUTION | Freq: Three times a day (TID) | RESPIRATORY_TRACT | Status: DC
  Filled 2019-07-09: qty 3

## 2019-07-09 MED ORDER — POTASSIUM CHLORIDE CRYS ER 20 MEQ PO TBCR: 40.0000 meq | EXTENDED_RELEASE_TABLET | Freq: Once | ORAL | Status: DC

## 2019-07-09 MED ORDER — CELECOXIB 200 MG PO CAPS
200.0000 mg | ORAL_CAPSULE | Freq: Two times a day (BID) | ORAL | Status: DC
  Administered 2019-07-09 – 2019-07-24 (×28): 200 mg
  Filled 2019-07-09 (×32): qty 1

## 2019-07-09 MED ORDER — ACETAMINOPHEN 500 MG PO TABS
500.0000 mg | ORAL_TABLET | Freq: Four times a day (QID) | ORAL | Status: DC | PRN
  Administered 2019-07-12 – 2019-07-18 (×3): 500 mg
  Filled 2019-07-09 (×2): qty 1

## 2019-07-09 MED ORDER — TRAZODONE HCL 50 MG PO TABS
25.0000 mg | ORAL_TABLET | Freq: Every evening | ORAL | Status: DC | PRN
  Administered 2019-07-11: 25 mg
  Filled 2019-07-09 (×2): qty 1

## 2019-07-09 MED ORDER — SODIUM CHLORIDE 0.9% FLUSH
3.0000 mL | Freq: Two times a day (BID) | INTRAVENOUS | Status: DC
  Administered 2019-07-09 – 2019-07-10 (×4): 3 mL via INTRAVENOUS

## 2019-07-09 MED ORDER — POTASSIUM CHLORIDE 10 MEQ/50ML IV SOLN
10.0000 meq | INTRAVENOUS | Status: AC
  Administered 2019-07-09 (×3): 10 meq via INTRAVENOUS
  Filled 2019-07-09 (×2): qty 50

## 2019-07-09 MED ORDER — SODIUM CHLORIDE 0.9 % IV SOLN: 250.0000 mL | INTRAVENOUS | Status: DC | PRN

## 2019-07-09 NOTE — Op Note (Signed)
Procedure(s): THORACOTOMY MAJOR EMPYEMA DRAINAGE APPLICATION OF WOUND VAC Procedure Note  ALAHNA TERZIAN female 79 y.o. 07/09/2019  Procedure(s) and Anesthesia Type:    * THORACOTOMY MAJOR - General    * EMPYEMA DRAINAGE - General    * APPLICATION OF WOUND VAC - General  Surgeon(s) and Role:    * Wonda Olds, MD - Primary   Indications: The patient was admitted to the hospital with a brief history of left flank abscess. This was found to be associated with left empyema. She has been sufficiently evaluated as an acceptable surgical candidate with low risk for perioperative events based on cardiac workup. She is taken for left thoracotomy and empyema evacuation     Surgeon: Wonda Olds   Assistants: Leretha Pol PA-C  Anesthesia: General endotracheal anesthesia  ASA Class: 5    Procedure Detail  THORACOTOMY MAJOR, EMPYEMA DRAINAGE, APPLICATION OF WOUND VAC  After informed consent, she is taken to the OR at [facility] and placed supine on the OR table. Anesthesia is begun in a smooth fashion with GET using bronchial blocker due to her small size. She is placed in the right lateral decubitus position. The left chest is cleansed and draped sterilely with betadine solution. A preoperative pause was performed to correct patient ID and operative plan.   A left posterolateral thoracotomy was performed overlying the 6th rib. Dissection was carried through the latissimus muscle and the serratus anterior muscle was spared. The 7th ICS was entered and the 6th rib was resected. A thick abscess peel was encountered and when violated a large amount of purulent material was encountered which was aspirated free. Samples were sent for culture. The incision was connected medially and inferiorly to the subcutaneous phlegmon that had prompted admission in the 1st place. Entry into this space showed marked necrosis of the skin and soft tissues. The most devitalized portion of the soft  tissue was resected and sent for pathology. Copious irrigation was undertaken. Attempt was made to decorticate the left lower lobe of the lung, but a good plane could not be achieved without tearing the lung, so this was aborted. A chest tube was placed into the abscess cavity, and an irrigating wound vac was placed in the defects as well.. This was covered with vac drape, and the procedure was concluded.    Estimated Blood Loss:  300 mL         Drains: one 32 Fr Chest tube           Blood Given: 500 CC PRBC          Specimens: as above         Implants: none        Complications:  * No complications entered in OR log *         Disposition: ICU - intubated and hemodynamically stable.         Condition: stable

## 2019-07-09 NOTE — Progress Notes (Signed)
ANTICOAGULATION CONSULT NOTE - Initial Consult  Pharmacy Consult for heparin Indication: atrial fibrillation  Allergies  Allergen Reactions  . Codeine Nausea And Vomiting  . Gluten Meal Diarrhea    Abdominal pain and bloating  . Lactose Diarrhea    Bloating and abdominal pain  . Neosporin [Neomycin-Bacitracin Zn-Polymyx]   . Other     Pain medication pt. Cannot remember the name.    Patient Measurements: Height: 5' (152.4 cm) Weight: 157 lb 12.2 oz (71.6 kg) IBW/kg (Calculated) : 45.5 Heparin Dosing Weight: 61kg  Vital Signs: Temp: 98.2 F (36.8 C) (02/12 1129) Temp Source: Oral (02/12 1129) BP: 114/47 (02/12 1431) Pulse Rate: 92 (02/12 1445)  Labs: Recent Labs    07/07/19 1854 07/07/19 1854 07/08/19 0244 07/08/19 1334 07/08/19 1823 07/08/19 1824 07/08/19 2036 07/08/19 2036 07/08/19 2243 07/09/19 0427 07/09/19 0430  HGB 8.9*   < > 7.5*   < > 9.6*   < > 10.3*   < >  --  10.2* 9.9*  HCT 28.5*   < > 23.8*   < > 29.6*   < > 30.8*  --   --  30.0* 30.6*  PLT 254   < > 219  --  203  --  237  --   --   --  248  CREATININE 0.53  --  0.53  --   --   --   --   --  0.67  --   --    < > = values in this interval not displayed.    Estimated Creatinine Clearance: 52.8 mL/min (by C-G formula based on SCr of 0.67 mg/dL).   Medical History: Past Medical History:  Diagnosis Date  . Allergy   . Anemia   . Anxiety   . Arthritis   . Cataract    pt. not sure which eye early stage  . Depression   . GERD (gastroesophageal reflux disease)   . Hypertension    Assessment: 79 year old female admitted for thoracotomy and drainage of empyema.   Patient with afib to start IV heparin, will not bolus and aim for low end of goal d/t open thoracotomy site.   Not on anticoagulation prior to admit. Hgb around 10 after 2 units overnight.   Goal of Therapy:  Heparin level 0.3-0.5 units/ml Aim for ~0.3 Monitor platelets by anticoagulation protocol: Yes   Plan:  Start heparin  infusion at 750 units/hr Check anti-Xa level in 8 hours and daily while on heparin Continue to monitor H&H and platelets  Erin Hearing PharmD., BCPS Clinical Pharmacist 07/09/2019 3:06 PM

## 2019-07-09 NOTE — Progress Notes (Signed)
1 Day Post-Op Procedure(s) (LRB): THORACOTOMY MAJOR (Left) EMPYEMA DRAINAGE (Left) APPLICATION OF WOUND VAC (Left) Subjective: Intubated, following commands  Objective: Vital signs in last 24 hours: Temp:  [95.5 F (35.3 C)-99.5 F (37.5 C)] 99 F (37.2 C) (02/12 0700) Pulse Rate:  [0-295] 79 (02/12 0730) Cardiac Rhythm: Normal sinus rhythm (02/12 0400) Resp:  [4-41] 16 (02/12 0730) BP: (61-125)/(44-83) 61/55 (02/12 0730) SpO2:  [0 %-100 %] 99 % (02/12 0730) Arterial Line BP: (57-130)/(50-69) 57/53 (02/12 0700) FiO2 (%):  [40 %] 40 % (02/12 0730)  Hemodynamic parameters for last 24 hours: PAP: (31-50)/(15-28) 40/15 CO:  [4.2 L/min-4.5 L/min] 4.5 L/min CI:  [2.5 L/min/m2-2.7 L/min/m2] 2.7 L/min/m2  Intake/Output from previous day: 02/11 0701 - 02/12 0700 In: 3899.7 [I.V.:2704.8; Blood:630; NG/GT:50; IV Piggyback:514.9] Out: 1210 [Urine:570; Drains:150; Blood:450; Chest Tube:40] Intake/Output this shift: No intake/output data recorded.  General appearance: intubated/sedated Neurologic: intact Heart: regular rate and rhythm, S1, S2 normal, no murmur, click, rub or gallop Lungs: diminished breath sounds LLL Abdomen: soft, non-tender; bowel sounds normal; no masses,  no organomegaly Extremities: edema 2+ Wound: dressing intact  Lab Results: Recent Labs    07/08/19 2036 07/08/19 2036 07/09/19 0427 07/09/19 0430  WBC 11.5*  --   --  12.1*  HGB 10.3*   < > 10.2* 9.9*  HCT 30.8*   < > 30.0* 30.6*  PLT 237  --   --  248   < > = values in this interval not displayed.   BMET:  Recent Labs    07/08/19 0244 07/08/19 1334 07/08/19 2243 07/09/19 0427  NA 141   < > 141 142  K 3.6   < > 3.6 4.0  CL 102  --  105  --   CO2 33*  --  26  --   GLUCOSE 78  --  89  --   BUN 8  --  7*  --   CREATININE 0.53  --  0.67  --   CALCIUM 7.9*  --  7.8*  --    < > = values in this interval not displayed.    PT/INR: No results for input(s): LABPROT, INR in the last 72 hours. 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

## 2019-07-09 NOTE — Progress Notes (Addendum)
EVENING ROUNDS NOTE :     [address]       [place] [address]             [phone]                 1 Day Post-Op Procedure(s) (LRB): THORACOTOMY MAJOR (Left) EMPYEMA DRAINAGE (Left) APPLICATION OF WOUND VAC (Left)  Total Length of Stay:  LOS: 2 days  BP 90/78   Pulse 100   Temp 98.8 F (37.1 C) (Oral)   Resp 16   Ht 5' (1.524 m)   Wt 71.6 kg   SpO2 100%   BMI 30.81 kg/m   .Intake/Output      02/12 0701 - 02/13 0700   I.V. (mL/kg) 263.2 (3.7)   Blood    NG/GT    IV Piggyback 76.1   Total Intake(mL/kg) 339.3 (4.7)   Urine (mL/kg/hr) 270 (0.3)   Drains 250   Stool    Blood    Chest Tube 70   Total Output 590   Net -250.7         . sodium chloride Stopped (07/08/19 0448)  . sodium chloride    . sodium chloride    . dexmedetomidine (PRECEDEX) IV infusion Stopped (07/09/19 0741)  . heparin 750 Units/hr (07/09/19 1900)  . norepinephrine (LEVOPHED) Adult infusion 2 mcg/min (07/09/19 1900)  . piperacillin-tazobactam (ZOSYN)  IV Stopped (07/09/19 1616)  . vancomycin Stopped (07/09/19 0011)     Lab Results  Component Value Date   WBC 12.1 (H) 07/09/2019   HGB 9.9 (L) 07/09/2019   HCT 30.6 (L) 07/09/2019   PLT 248 07/09/2019   GLUCOSE 89 07/08/2019   NA 142 07/09/2019   K 4.0 07/09/2019   CL 105 07/08/2019   CREATININE 0.67 07/08/2019   BUN 7 (L) 07/08/2019   CO2 26 07/08/2019  Extubated earlier today. On 1-2 L [place] with good oxygenation Chest tube output with 70 cc last 12 hours;trending downward Wound VAC with 250 cc (dark drainage) of output last 12 hours. She has a history of PAF. HR is up to 110's (does not appear to be a fib) so will give Lopressor 12.5 mg bid (already ordered) K 3.4 so will supplement via IV  Lars Pinks PA-C 07/09/2019 7:45 PM  breathing comfortably O2 sat 94% Less VAC drainage last 4 hours

## 2019-07-09 NOTE — Addendum Note (Signed)
Addendum  created 07/09/19 0801 by Roberts Gaudy, MD   Clinical Note Signed

## 2019-07-09 NOTE — Progress Notes (Addendum)
Advanced Heart Failure Rounding Note   Subjective:    S/p thoracotomy on 2/11. Now with wound vac and chest tube in place.   Extubated today. Feeling ok. Still weak. Pain controlled    Objective:   Weight Range:  Vital Signs:   Temp:  [95.5 F (35.3 C)-99.5 F (37.5 C)] 98.8 F (37.1 C) (02/12 1536) Pulse Rate:  [44-117] 52 (02/12 1700) Resp:  [16-17] 16 (02/12 0730) BP: (61-137)/(42-112) 92/53 (02/12 1700) SpO2:  [93 %-100 %] 99 % (02/12 1700) Arterial Line BP: (44-130)/(37-69) 48/41 (02/12 0830) FiO2 (%):  [40 %] 40 % (02/12 0800)    Weight change: Filed Weights   07/07/19 1826 07/07/19 1837  Weight: 71.6 kg 71.6 kg    Intake/Output:   Intake/Output Summary (Last 24 hours) at 07/09/2019 1719 Last data filed at 07/09/2019 1700 Gross per 24 hour  Intake 2371.5 ml  Output 940 ml  Net 1431.5 ml     Physical Exam: General:  Elderly women No resp difficulty HEENT: normal Neck: supple. RIJ introducer  LIJ TLC. Carotids 2+ bilat; no bruits. No lymphadenopathy or thryomegaly appreciated. Cor: PMI nondisplaced. Irregular rate & rhythm. No rubs, gallops or murmurs. Lungs: decreased left base. Wound vac in place + CT Abdomen: soft, nontender, nondistended. No hepatosplenomegaly. No bruits or masses. Good bowel sounds. Extremities: no cyanosis, clubbing, rash, 2+ edema Neuro: alert & orientedx3, cranial nerves grossly intact. moves all 4 extremities w/o difficulty. Affect pleasant  Telemetry: Atrial bigeminy 80-90 Personally reviewed   Labs: Basic Metabolic Panel: Recent Labs  Lab 07/07/19 1854 07/08/19 0244 07/08/19 0244 07/08/19 1334 07/08/19 1343 07/08/19 1824 07/08/19 2243 07/09/19 0427 07/09/19 0430  NA  --  141   < > 147* 143 142 141 142  --   K  --  3.6   < > 3.0* 3.6 3.5 3.6 4.0  --   CL  --  102  --   --   --   --  105  --   --   CO2  --  33*  --   --   --   --  26  --   --   GLUCOSE  --  78  --   --   --   --  89  --   --   BUN  --  8  --    --   --   --  7*  --   --   CREATININE 0.53 0.53  --   --   --   --  0.67  --   --   CALCIUM  --  7.9*  --   --   --   --  7.8*  --   --   MG  --   --   --   --   --   --   --   --  1.2*   < > = values in this interval not displayed.    Liver Function Tests: No results for input(s): AST, ALT, ALKPHOS, BILITOT, PROT, ALBUMIN in the last 168 hours. No results for input(s): LIPASE, AMYLASE in the last 168 hours. No results for input(s): AMMONIA in the last 168 hours.  CBC: Recent Labs  Lab 07/07/19 1854 07/07/19 1854 07/08/19 0244 07/08/19 1334 07/08/19 1823 07/08/19 1824 07/08/19 2036 07/09/19 0427 07/09/19 0430  WBC 6.2  --  5.1  --  6.0  --  11.5*  --  12.1*  HGB 8.9*   < >  7.5*   < > 9.6* 9.5* 10.3* 10.2* 9.9*  HCT 28.5*   < > 23.8*   < > 29.6* 28.0* 30.8* 30.0* 30.6*  MCV 86.4  --  86.5  --  87.8  --  87.3  --  87.7  PLT 254  --  219  --  203  --  237  --  248   < > = values in this interval not displayed.    Cardiac Enzymes: No results for input(s): CKTOTAL, CKMB, CKMBINDEX, TROPONINI in the last 168 hours.  BNP: BNP (last 3 results) No results for input(s): BNP in the last 8760 hours.  ProBNP (last 3 results) No results for input(s): PROBNP in the last 8760 hours.    Other results:  Imaging: CARDIAC CATHETERIZATION  Result Date: 07/09/2019  Prox Cx to Mid Cx lesion is 20% stenosed.  Findings: Ao = 104/46 (72) LV =  90/14 RA =  7 RV = 47/8 PA = 50/9 (21) PCW = 19 Fick cardiac output/index = 8.8/5.2 PVR = < 1.0 WU Ao sat = 100 PA sat = 74%, 75% Assessment: 1. LVEF 60-65% 2. Minimal CAD 3. Very mild PAH Plan/Discussion: Ok to proceed with thoracotomy for very large left-sided empyema. Glori Bickers, MD 3:22 PM   DG CHEST PORT 1 VIEW  Result Date: 07/09/2019 CLINICAL DATA:  Status post thoracotomy and evacuation of left empyema. EXAM: PORTABLE CHEST 1 VIEW COMPARISON:  07/08/2019 FINDINGS: Endotracheal tube remains with the tip approximately 1.3 cm above the  carina. Swan-Ganz catheter tip remains in the proximal right pulmonary artery. Gastric decompression tube extends into the stomach. Single left chest tube shows stable positioning with no pneumothorax. Stable left lower lobe consolidation/atelectasis. Right lung remains clear. No edema. Stable mild cardiac enlargement. IMPRESSION: Stable left lower lobe consolidation/atelectasis. No pneumothorax. Electronically Signed   By: Aletta Edouard M.D.   On: 07/09/2019 08:44   DG CHEST PORT 1 VIEW  Result Date: 07/08/2019 CLINICAL DATA:  Endotracheally intubated. EXAM: PORTABLE CHEST 1 VIEW COMPARISON:  Radiograph and CT yesterday at [facility] . FINDINGS: Endotracheal tube tip 15 mm from the carina. Enteric tube in place with tip below the diaphragm not included in the field of view. Right internal jugular Swan-Ganz catheter tip in the region of the main pulmonary outflow tract. Left internal jugular central venous catheter tip in the lower SVC. Left chest tube in place. Left pleural effusion tracks superior laterally. Overall decreased opacity at the left lung base. No visualized pneumothorax. Mild cardiomegaly with unchanged mediastinal contours. Aortic atherosclerosis. Hazy opacity at the right lung base likely small right pleural effusion. Skin staples noted on the left. IMPRESSION: 1. Endotracheal tube tip 15 mm from the carina. Enteric tube, Swan-Ganz catheter, left central line in place. 2. Left chest tube in place. Small left pleural effusion tracks superior laterally. Overall decreased opacity at the left lung base. No pneumothorax. 3. Small right pleural effusion. Electronically Signed   By: Keith Rake M.D.   On: 07/08/2019 20:32      Medications:     Scheduled Medications: . celecoxib  200 mg Per Tube BID  . chlorhexidine gluconate (MEDLINE KIT)  15 mL Mouth Rinse BID  . Chlorhexidine Gluconate Cloth  6 each Topical Daily  . docusate  100 mg Per Tube BID  . levalbuterol  0.63 mg  Nebulization Q8H  . losartan  25 mg Per Tube Daily  . mouth rinse  15 mL Mouth Rinse 10 times per day  .  metoprolol tartrate  12.5 mg Per Tube BID  . mometasone-formoterol  2 puff Inhalation BID  . montelukast  10 mg Per Tube Daily  . pantoprazole sodium  40 mg Per Tube Daily  . sodium chloride flush  3 mL Intravenous Q12H     Infusions: . sodium chloride Stopped (07/08/19 0448)  . sodium chloride    . sodium chloride    . dexmedetomidine (PRECEDEX) IV infusion Stopped (07/09/19 0741)  . heparin 750 Units/hr (07/09/19 1700)  . norepinephrine (LEVOPHED) Adult infusion 4 mcg/min (07/09/19 1700)  . piperacillin-tazobactam (ZOSYN)  IV Stopped (07/09/19 1616)  . 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 Wound vac in place - on broad spectrum abx  - TCTS managing - BP low. Stop losartan  2. PAH - very mild by cath - continue sildenafi  3. PAF - currently in atrial bigeminy.  - now on heparin. Can hold as needed  4. Volume overload - diurese. - place TED hose   Length of Stay: 2   Glori Bickers 07/09/2019, 5:19 PM  Advanced Heart Failure Team Pager [phone] (M-F; 7a - 4p)  Please contact [facility] for night-coverage after hours (4p -7a ) and weekends on [email or web address]

## 2019-07-09 NOTE — Progress Notes (Signed)
Anesthesiology Follow-up:  Sedated on vent on precedex at 0.5 mcg/kg/min, opens eyes to commands, moving all extremities. On norepi 7 mcg/kg/min for BP support.   VS: T-37.2 BP- 118/44 HR 78 (SR with PACs)  PA 36/15 CO/CI- 4.5/2.7  FiO2-0.40 TV-285 RR-16 PEEP-5 PAP-15 PH  7.39 PCO2-47 PO2-120 K-4.0 BUN/Cr.- 7/0.67 H/H-9.9/30.6 Platelets-248,000  CXR: ETT 1.5 cm above carina, L. Upper lung expanded continued L.lower Lung field opacity.  Impression 79 year old female one day S/P L thoracotomy for drainage of L empyema and abscess eroding through chest wall. Stable overnight with good respiratory parameters. Plan to wean and extubate today. Return to OR for washout next 1-2 days.  Roberts Gaudy

## 2019-07-09 NOTE — Procedures (Signed)
Extubation Procedure Note  Patient Details:   Name: Connie Jarvis DOB: 01/05/1941 MRN: [id number]   Airway Documentation:    Vent end date: 07/09/19 Vent end time: 1155   Evaluation  O2 sats: stable throughout Complications: No apparent complications Patient did tolerate procedure well. Bilateral Breath Sounds: Inspiratory wheezes, Diminished   Yes   PT was extubates to a 3L [place]  Sats are stable and RT to monitor  Lorre Opdahl, Leonie Douglas 07/09/2019, 11:57 AM

## 2019-07-09 NOTE — Progress Notes (Signed)
Verbal orders from Dr Orvan Seen during AM rounds to extubate, remove PA catheter, remove right radial sheath and apply TR band. Radial sheath removed by cath lab staff and TR band applied at 0855 with 16 cc air in band. Will deflate TR band per protocol.  Nelva Bush RN

## 2019-07-10 ENCOUNTER — Encounter (HOSPITAL_COMMUNITY): Payer: Self-pay

## 2019-07-10 ENCOUNTER — Inpatient Hospital Stay (HOSPITAL_COMMUNITY): Payer: Medicare Other

## 2019-07-10 LAB — CBC
HCT: 26.9 % — ABNORMAL LOW (ref 36.0–46.0)
Hemoglobin: 8.7 g/dL — ABNORMAL LOW (ref 12.0–15.0)
MCH: 28.6 pg (ref 26.0–34.0)
MCHC: 32.3 g/dL (ref 30.0–36.0)
MCV: 88.5 fL (ref 80.0–100.0)
Platelets: 203 10*3/uL (ref 150–400)
RBC: 3.04 MIL/uL — ABNORMAL LOW (ref 3.87–5.11)
RDW: 15.9 % — ABNORMAL HIGH (ref 11.5–15.5)
WBC: 11.9 10*3/uL — ABNORMAL HIGH (ref 4.0–10.5)
nRBC: 0 % (ref 0.0–0.2)

## 2019-07-10 LAB — HEPARIN LEVEL (UNFRACTIONATED)
Heparin Unfractionated: 0.18 IU/mL — ABNORMAL LOW (ref 0.30–0.70)
Heparin Unfractionated: <0.1 IU/mL — ABNORMAL LOW (ref 0.30–0.70)
Heparin Unfractionated: <0.1 IU/mL — ABNORMAL LOW (ref 0.30–0.70)
Heparin Unfractionated: <0.1 IU/mL — ABNORMAL LOW (ref 0.30–0.70)

## 2019-07-10 LAB — BASIC METABOLIC PANEL
Anion gap: 9 (ref 5–15)
BUN: 7 mg/dL — ABNORMAL LOW (ref 8–23)
CO2: 27 mmol/L (ref 22–32)
Calcium: 7.6 mg/dL — ABNORMAL LOW (ref 8.9–10.3)
Chloride: 105 mmol/L (ref 98–111)
Creatinine, Ser: 0.75 mg/dL (ref 0.44–1.00)
GFR calc Af Amer: >60 mL/min (ref >60)
GFR calc non Af Amer: >60 mL/min (ref >60)
Glucose, Bld: 70 mg/dL (ref 70–99)
Potassium: 4.2 mmol/L (ref 3.5–5.1)
Sodium: 141 mmol/L (ref 135–145)

## 2019-07-10 LAB — MAGNESIUM: Magnesium: 1.1 mg/dL — ABNORMAL LOW (ref 1.7–2.4)

## 2019-07-10 MED ORDER — AMIODARONE HCL IN DEXTROSE 360-4.14 MG/200ML-% IV SOLN
30.0000 mg/h | INTRAVENOUS | Status: DC
  Administered 2019-07-10 – 2019-07-17 (×10): 30 mg/h via INTRAVENOUS
  Filled 2019-07-10 (×12): qty 200

## 2019-07-10 MED ORDER — LEVALBUTEROL HCL 0.63 MG/3ML IN NEBU
0.6300 mg | INHALATION_SOLUTION | Freq: Three times a day (TID) | RESPIRATORY_TRACT | Status: DC
  Administered 2019-07-10 – 2019-07-15 (×14): 0.63 mg via RESPIRATORY_TRACT
  Filled 2019-07-10 (×15): qty 3

## 2019-07-10 MED ORDER — BOOST PO LIQD
237.0000 mL | Freq: Three times a day (TID) | ORAL | Status: DC
  Administered 2019-07-10: 237 mL via ORAL
  Filled 2019-07-10 (×3): qty 237

## 2019-07-10 MED ORDER — MAGNESIUM SULFATE 50 % IJ SOLN
6.0000 g | Freq: Once | INTRAVENOUS | Status: AC
  Administered 2019-07-10: 6 g via INTRAVENOUS
  Filled 2019-07-10: qty 10

## 2019-07-10 MED ORDER — ENSURE ENLIVE PO LIQD
237.0000 mL | Freq: Three times a day (TID) | ORAL | Status: DC
  Administered 2019-07-11 – 2019-07-16 (×6): 237 mL via ORAL

## 2019-07-10 MED ORDER — AMIODARONE HCL IN DEXTROSE 360-4.14 MG/200ML-% IV SOLN
INTRAVENOUS | Status: AC
  Administered 2019-07-10: 150 mg via INTRAVENOUS
  Filled 2019-07-10: qty 200

## 2019-07-10 MED ORDER — AMIODARONE HCL IN DEXTROSE 360-4.14 MG/200ML-% IV SOLN
60.0000 mg/h | INTRAVENOUS | Status: DC
  Administered 2019-07-10 (×2): 60 mg/h via INTRAVENOUS
  Filled 2019-07-10: qty 200

## 2019-07-10 MED ORDER — DOCUSATE SODIUM 100 MG PO CAPS
100.0000 mg | ORAL_CAPSULE | Freq: Every day | ORAL | Status: DC
  Administered 2019-07-14 – 2019-07-17 (×4): 100 mg via ORAL
  Filled 2019-07-10 (×5): qty 1

## 2019-07-10 MED ORDER — FUROSEMIDE 10 MG/ML IJ SOLN
20.0000 mg | Freq: Once | INTRAMUSCULAR | Status: AC
  Administered 2019-07-10: 20 mg via INTRAVENOUS
  Filled 2019-07-10: qty 2

## 2019-07-10 MED ORDER — METOPROLOL TARTRATE 25 MG PO TABS: 25.0000 mg | ORAL_TABLET | Freq: Two times a day (BID) | ORAL | Status: DC

## 2019-07-10 MED ORDER — AMIODARONE LOAD VIA INFUSION
150.0000 mg | Freq: Once | INTRAVENOUS | Status: AC
  Filled 2019-07-10: qty 83.34

## 2019-07-10 MED ORDER — PANTOPRAZOLE SODIUM 40 MG PO TBEC
40.0000 mg | DELAYED_RELEASE_TABLET | Freq: Every day | ORAL | Status: DC
  Administered 2019-07-10 – 2019-07-24 (×13): 40 mg via ORAL
  Filled 2019-07-10 (×13): qty 1

## 2019-07-10 MED ORDER — SILDENAFIL CITRATE 20 MG PO TABS
20.0000 mg | ORAL_TABLET | Freq: Three times a day (TID) | ORAL | Status: DC
  Administered 2019-07-10 – 2019-07-12 (×6): 20 mg via ORAL
  Filled 2019-07-10 (×6): qty 1

## 2019-07-10 MED ORDER — ORAL CARE MOUTH RINSE
15.0000 mL | Freq: Two times a day (BID) | OROMUCOSAL | Status: DC
  Administered 2019-07-10 – 2019-07-16 (×11): 15 mL via OROMUCOSAL

## 2019-07-10 MED ORDER — FUROSEMIDE 10 MG/ML IJ SOLN: 20.0000 mg | Freq: Once | INTRAMUSCULAR | Status: DC

## 2019-07-10 NOTE — Progress Notes (Signed)
This RN was contacted about pt's low heparin level. Pt has hematoma on R forearm and circumferential bruising on L arm where BP cuff was previously located. Pharmacy made aware, rate dose changed ton 900.

## 2019-07-10 NOTE — Progress Notes (Signed)
Dr Prescott Gum made aware of xray results

## 2019-07-10 NOTE — Evaluation (Signed)
Clinical/Bedside Swallow Evaluation Patient Details  Name: Connie Jarvis MRN: [id number] Date of Birth: Sep 30, 1940  Today's Date: 07/10/2019 Time: SLP Start Time (ACUTE ONLY): [id number] SLP Stop Time (ACUTE ONLY): 0925 SLP Time Calculation (min) (ACUTE ONLY): 30 min  Past Medical History:  Past Medical History:  Diagnosis Date  . Allergy   . Anemia   . Anxiety   . Arthritis   . Cataract    pt. not sure which eye early stage  . Depression   . GERD (gastroesophageal reflux disease)   . Hypertension    Past Surgical History:  Past Surgical History:  Procedure Laterality Date  . APPLICATION OF WOUND VAC Left 07/08/2019   Procedure: APPLICATION OF WOUND VAC;  Surgeon: Wonda Olds, MD;  Location: [facility] OR;  Service: Thoracic;  Laterality: Left;  . CENTRAL LINE INSERTION Left 07/08/2019   Procedure: CENTRAL LINE INSERTION;  Surgeon: Jolaine Artist, MD;  Location: [facility];  Service: Cardiovascular;  Laterality: Left;  . COLONOSCOPY    . EMPYEMA DRAINAGE Left 07/08/2019   Procedure: EMPYEMA DRAINAGE;  Surgeon: Wonda Olds, MD;  Location: [facility] OR;  Service: Thoracic;  Laterality: Left;  . MOUTH SURGERY    . RIGHT/LEFT HEART CATH AND CORONARY ANGIOGRAPHY N/A 07/08/2019   Procedure: RIGHT/LEFT HEART CATH AND CORONARY ANGIOGRAPHY;  Surgeon: Jolaine Artist, MD;  Location: [place] [facility];  Service: Cardiovascular;  Laterality: N/A;  . THORACOTOMY Left 07/08/2019   Procedure: THORACOTOMY MAJOR;  Surgeon: Wonda Olds, MD;  Location: [facility] OR;  Service: Thoracic;  Laterality: Left;  . TONSILLECTOMY    . TUBAL LIGATION     HPI:  79 yo female adm to [facility] with empyema, fungating mass s/p surgical intervention on 2/12 with drain placement, thoractomy.  Pt also for OR 2/14.  She has h/o HTN, pulmonary HTN, COPD, GERD and takes Prevaid for reflux at home.  Per chart, pt intubated 2/11 ? twice per chart review *at 1500 and 1517* .  Extubated 2/12 at 1155 am.  Pt denies h/o  dysphagia except to large pills that she positions adequately to clear.  Her CXR showed possible pneumonperitoneum therefore SLP spoke to RN re: swallow eval.  Proceeding with eval with minimal intake approved.   Assessment / Plan / Recommendation Clinical Impression  Pt with negative CN exam but does have erthyema on left posterior soft palate and right lateral lingu but dnies discomfort.  Voice is hoarse but much improved compared to yesterday per pt *was a whisper after extubation.  Minimal intake observed and this limits evaluation and rules 3 ounce water test inapprpriate.  Minimal intake per RN due to pt possibly having a procedure.  No overt indication of aspiration with all po observed *3 small boluses of water and a single peach.  She does report sensing throat is swollen and reports coughing up a significant amount of secretions yesterday.  Intermittent subtle throat clearing noted after intake of peach but suspect secondary to pharyngeal edema.  Pt self reports voice strength to be 6/10 and swallow ability to be 7 or 8/10.  Swallowing clinical consistent with timely ability and adequate clearance without evidence of residuals.   Recommend regular/thin diet and SLP will follow up x1 due to limited evaluation.  Advised pt to speak to MD if she is noticing dysphagia symptoms which we reviewed.  Thanks for this consult. SLP Visit Diagnosis: Dysphagia, unspecified (R13.10)    Aspiration Risk  Mild aspiration risk  Diet Recommendation Thin liquid;Regular   Liquid Administration via: Cup;Straw Medication Administration: Whole meds with liquid(with puree if problematic) Supervision: Patient able to self feed Compensations: Slow rate;Small sips/bites Postural Changes: Seated upright at 90 degrees;Remain upright for at least 30 minutes after po intake    Other  Recommendations Oral Care Recommendations: Oral care BID   Follow up Recommendations        Frequency and Duration min 1 x/week   1 week       Prognosis Prognosis for Safe Diet Advancement: Good      Swallow Study   General Date of Onset: 07/10/19 HPI: 79 yo female adm to [facility] with empyema, fungating mass s/p surgical intervention on 2/12 with drain placement, thoractomy.  Pt also for OR 2/14.  She has h/o HTN, pulmonary HTN, COPD, GERD and takes Prevaid for reflux at home.  Per chart, pt intubated 2/11 ? twice per chart review *at 1500 and 1517* .  Extubated 2/12 at 1155 am.  Pt denies h/o dysphagia except to large pills that she positions adequately to clear.  Her CXR showed possible pneumonperitoneum therefore SLP spoke to RN re: swallow eval.  Proceeding with eval with minimal intake approved. Type of Study: Bedside Swallow Evaluation Diet Prior to this Study: NPO Temperature Spikes Noted: No History of Recent Intubation: Yes Length of Intubations (days): 2 days Date extubated: 07/09/19 Behavior/Cognition: Alert;Cooperative;Pleasant mood Oral Cavity Assessment: Erythema(posterior right palate and lateral lingua on left- pt denies discomfort) Oral Care Completed by SLP: No Oral Cavity - Dentition: Adequate natural dentition Vision: Functional for self-feeding Self-Feeding Abilities: Able to feed self Patient Positioning: Upright in bed Baseline Vocal Quality: Hoarse Volitional Cough: Other (Comment)(DNT due to chest tube in place) Volitional Swallow: Able to elicit(after oral care swallow was elicited)    Oral/Motor/Sensory Function Overall Oral Motor/Sensory Function: Within functional limits   Ice Chips Ice chips: Within functional limits Presentation: Spoon   Thin Liquid Thin Liquid: Within functional limits Presentation: Cup;Self Fed    Nectar Thick Nectar Thick Liquid: Not tested   Honey Thick Honey Thick Liquid: Not tested   Puree Puree: Not tested   Solid     Presentation: Self Fed Other Comments: subtle throat clearing noted x2 with pt reporting "my throat feels swollen" , pt reports  coughing and expectorating copious amounts of secretions thus suspect this as source of throat clearing      Macario Golds 07/10/2019,9:50 AM   Kathleen Lime, MS [place] Office [phone]

## 2019-07-10 NOTE — Progress Notes (Addendum)
[place] for heparin Indication: atrial fibrillation  Allergies  Allergen Reactions  . Codeine Nausea And Vomiting  . Gluten Meal Diarrhea    Abdominal pain and bloating  . Lactose Diarrhea    Bloating and abdominal pain  . Neosporin [Neomycin-Bacitracin Zn-Polymyx]   . Other     Pain medication pt. Cannot remember the name.    Patient Measurements: Height: 5' (152.4 cm) Weight: 157 lb 12.2 oz (71.6 kg) IBW/kg (Calculated) : 45.5 Heparin Dosing Weight: 61kg  Vital Signs: Temp: 98.6 F (37 C) (02/13 0756) Temp Source: Oral (02/13 0756) BP: 103/73 (02/13 0900) Pulse Rate: 121 (02/13 0900)  Labs: Recent Labs    07/08/19 0244 07/08/19 2036 07/08/19 2036 07/08/19 2243 07/09/19 0427 07/09/19 0427 07/09/19 0430 07/09/19 1930 07/09/19 2337 07/10/19 0430 07/10/19 0856  HGB  --  10.3*   < >  --  10.2*   < > 9.9*  --   --  8.7*  --   HCT  --  30.8*   < >  --  30.0*  --  30.6*  --   --  26.9*  --   PLT  --  237  --   --   --   --  248  --   --  203  --   HEPARINUNFRC  --   --   --   --   --   --   --   --  <0.10* <0.10* <0.10*  CREATININE   < >  --   --  0.67  --   --   --  0.79  --  0.75  --    < > = values in this interval not displayed.    Estimated Creatinine Clearance: 52.8 mL/min (by C-G formula based on SCr of 0.75 mg/dL).  Assessment: 79 year old female with Afib> currently in SR  s/p thoracotomy and drainage of empyema for heparin Heparin drip 900 uts/hr HL < 0.1 < goal, h/h stable  Will uptitrate slowly   Goal of Therapy:  Heparin level 0.3-0.5 units/ml Aim for ~0.3 Monitor platelets by anticoagulation protocol: Yes   Plan:  Increase Heparin 1050 uts/hr  Check HL in 8hr  Daily HL, cbc   Bonnita Nasuti Pharm.D. CPP, BCPS Clinical Pharmacist [phone] 07/10/2019 10:18 AM

## 2019-07-10 NOTE — Plan of Care (Addendum)
Pt resting comfortably in bed. Has some pain at surgical site and general thoracic cavity. CT output minimal, wound vac drainage lessening. Both are dark red. 2 CHG baths given at 0000 and 0600 in the event that the pt goes back to OR tomorrow. Still requiring low-dose pressor support. Is producing thick tan sputum; pt encouraged to use IS to expand and clear airway.    Problem: Education: Goal: Knowledge of General Education information will improve Description: Including pain rating scale, medication(s)/side effects and non-pharmacologic comfort measures Outcome: Progressing   Problem: Clinical Measurements: Goal: Ability to maintain clinical measurements within normal limits will improve Outcome: Progressing Goal: Diagnostic test results will improve Outcome: Progressing Goal: Respiratory complications will improve Outcome: Progressing Goal: Cardiovascular complication will be avoided Outcome: Progressing   Problem: Coping: Goal: Level of anxiety will decrease Outcome: Progressing   Problem: Elimination: Goal: Will not experience complications related to urinary retention Outcome: Progressing   Problem: Pain Managment: Goal: General experience of comfort will improve Outcome: Progressing   Problem: Safety: Goal: Ability to remain free from injury will improve Outcome: Progressing

## 2019-07-10 NOTE — Progress Notes (Signed)
CT surgery p.m. rounds  Patient was up to chair today with PT assistance Now on IV amiodarone for rapid A. fib with conversion to sinus rhythm Low-dose norepinephrine was increased to support blood pressure during rapid A. fib but now will be weaned as tolerated as blood pressure has improved

## 2019-07-10 NOTE — Progress Notes (Signed)
2 Days Post-Op Procedure(s) (LRB): THORACOTOMY MAJOR (Left) EMPYEMA DRAINAGE (Left) APPLICATION OF WOUND VAC (Left) Subjective: left empyema and chest wall infection- OR culture gram + cocci Min drainage L chest tube- cxr good Ratecontrolled afib on low dose heparin Passed swallow eval - start gluten free diet and protein supplement PT worked with patient today Objective: Vital signs in last 24 hours: Temp:  [98.2 F (36.8 C)-99.1 F (37.3 C)] 98.6 F (37 C) (02/13 0756) Pulse Rate:  [52-127] 111 (02/13 1000) Cardiac Rhythm: Sinus tachycardia (02/13 0800) Resp:  [16-18] 18 (02/13 0727) BP: (87-137)/(42-112) 100/65 (02/13 1000) SpO2:  [93 %-100 %] 100 % (02/13 1000)  Hemodynamic parameters for last 24 hours: PAP: (32)/(12) 32/12  Intake/Output from previous day: 02/12 0701 - 02/13 0700 In: 1232.2 [I.V.:450.7; IV Piggyback:771.6] Out: 1650 [Urine:990; Drains:550; Chest Tube:110] Intake/Output this shift: Total I/O In: 50.3 [I.V.:33; IV Piggyback:17.3] Out: 75 [Urine:75]  Neuro intact afib rate 110 abd soft Chest tube secure  Lab Results: Recent Labs    07/09/19 0430 07/10/19 0430  WBC 12.1* 11.9*  HGB 9.9* 8.7*  HCT 30.6* 26.9*  PLT 248 203   BMET:  Recent Labs    07/09/19 1930 07/10/19 0430  NA 143 141  K 3.4* 4.2  CL 106 105  CO2 26 27  GLUCOSE 67* 70  BUN 8 7*  CREATININE 0.79 0.75  CALCIUM 7.7* 7.6*    PT/INR: No results for input(s): LABPROT, INR in the last 72 hours. ABG    Component Value Date/Time   PHART 7.391 07/09/2019 0427   HCO3 28.5 (H) 07/09/2019 0427   TCO2 30 07/09/2019 0427   O2SAT 99.0 07/09/2019 0427   CBG (last 3)  Recent Labs    07/08/19 2030 07/08/19 2256 07/09/19 0429  GLUCAP 66* 85 93    Assessment/Plan: S/P Procedure(s) (LRB): THORACOTOMY MAJOR (Left) EMPYEMA DRAINAGE (Left) APPLICATION OF WOUND VAC (Left) Nutrition Wound care with irrigating vac Mobilize followup OR cultures   LOS: 3 days    Tharon Aquas Trigt III 07/10/2019

## 2019-07-10 NOTE — Progress Notes (Signed)
Radiology called with questionable left pneumoperitoneum.  MD on call paged.

## 2019-07-10 NOTE — Progress Notes (Signed)
[facility] for heparin Indication: atrial fibrillation  Allergies  Allergen Reactions  . Codeine Nausea And Vomiting  . Gluten Meal Diarrhea    Abdominal pain and bloating  . Lactose Diarrhea    Bloating and abdominal pain  . Neosporin [Neomycin-Bacitracin Zn-Polymyx]   . Other     Pain medication pt. Cannot remember the name.    Patient Measurements: Height: 5' (152.4 cm) Weight: 157 lb 12.2 oz (71.6 kg) IBW/kg (Calculated) : 45.5 Heparin Dosing Weight: 61kg  Vital Signs: Temp: 99.1 F (37.3 C) (02/12 2110) Temp Source: Oral (02/12 2110) BP: 116/60 (02/12 2300) Pulse Rate: 109 (02/12 2300)  Labs: Recent Labs    07/08/19 0244 07/08/19 1334 07/08/19 1823 07/08/19 1824 07/08/19 2036 07/08/19 2036 07/08/19 2243 07/09/19 0427 07/09/19 0430 07/09/19 1930 07/09/19 2337  HGB 7.5*   < > 9.6*   < > 10.3*   < >  --  10.2* 9.9*  --   --   HCT 23.8*   < > 29.6*   < > 30.8*  --   --  30.0* 30.6*  --   --   PLT 219  --  203  --  237  --   --   --  248  --   --   HEPARINUNFRC  --   --   --   --   --   --   --   --   --   --  <0.10*  CREATININE 0.53  --   --   --   --   --  0.67  --   --  0.79  --    < > = values in this interval not displayed.    Estimated Creatinine Clearance: 52.8 mL/min (by C-G formula based on SCr of 0.79 mg/dL).  Assessment: 79 year old female with Afib s/p thoracotomy and drainage of empyema for heparin  Goal of Therapy:  Heparin level 0.3-0.5 units/ml Aim for ~0.3 Monitor platelets by anticoagulation protocol: Yes   Plan:  Increase Heparin  900 units/hr Check heparin level in 8 hours.  Phillis Knack, PharmD, BCPS  07/10/2019 12:23 AM

## 2019-07-10 NOTE — Progress Notes (Addendum)
Advanced Heart Failure Rounding Note   Subjective:    S/p thoracotomy on 2/11. Now with wound vac and chest tube in place.   C/o severe pain in her legs from compression stockings. Denies SOB or CP. CT drained 100cc overnight. Wound vac working well.   AF. On broad spectrum abx. Gram stain with +++GPC.   Diuresed well with IV lasix. CVP 6. On low dose NE   Objective:   Weight Range:  Vital Signs:   Temp:  [98.2 F (36.8 C)-99.1 F (37.3 C)] 98.6 F (37 C) (02/13 0756) Pulse Rate:  [44-127] 113 (02/13 0727) Resp:  [16-18] 18 (02/13 0727) BP: (71-137)/(42-112) 113/79 (02/13 0700) SpO2:  [93 %-100 %] 99 % (02/13 0727)    Weight change: Filed Weights   07/07/19 1826 07/07/19 1837  Weight: 71.6 kg 71.6 kg    Intake/Output:   Intake/Output Summary (Last 24 hours) at 07/10/2019 0846 Last data filed at 07/10/2019 0700 Gross per 24 hour  Intake 1190.66 ml  Output 1580 ml  Net -389.34 ml     Physical Exam: General:  Sitting up in bed. No resp difficulty HEENT: normal Neck: supple. JVP 6 Carotids 2+ bilat; no bruits. No lymphadenopathy or thryomegaly appreciated. Cor: PMI nondisplaced. Irregular rate & rhythm. No rubs, gallops or murmurs. Lungs: clear Wound vac and CT site looks great Abdomen: soft, nontender, nondistended. No hepatosplenomegaly. No bruits or masses. Good bowel sounds. Extremities: no cyanosis, clubbing, rash, 1-2+ edema Neuro: alert & orientedx3, cranial nerves grossly intact. moves all 4 extremities w/o difficulty. Affect pleasant  Telemetry: NSR with frequent PACs  100-120  Personally reviewed   Labs: Basic Metabolic Panel: Recent Labs  Lab 07/07/19 1854 07/08/19 0244 07/08/19 0244 07/08/19 1334 07/08/19 1824 07/08/19 2243 07/09/19 0427 07/09/19 0430 07/09/19 1930 07/10/19 0430  NA  --  141  --    < > 142 141 142  --  143 141  K  --  3.6  --    < > 3.5 3.6 4.0  --  3.4* 4.2  CL  --  102  --   --   --  105  --   --  106 105  CO2   --  33*  --   --   --  26  --   --  26 27  GLUCOSE  --  78  --   --   --  89  --   --  67* 70  BUN  --  8  --   --   --  7*  --   --  8 7*  CREATININE 0.53 0.53  --   --   --  0.67  --   --  0.79 0.75  CALCIUM  --  7.9*   < >  --   --  7.8*  --   --  7.7* 7.6*  MG  --   --   --   --   --   --   --  1.2*  --  1.1*   < > = values in this interval not displayed.    Liver Function Tests: No results for input(s): AST, ALT, ALKPHOS, BILITOT, PROT, ALBUMIN in the last 168 hours. No results for input(s): LIPASE, AMYLASE in the last 168 hours. No results for input(s): AMMONIA in the last 168 hours.  CBC: Recent Labs  Lab 07/08/19 0244 07/08/19 1334 07/08/19 1823 07/08/19 1823 07/08/19 1824 07/08/19 2036 07/09/19 0427 07/09/19 0430  07/10/19 0430  WBC 5.1  --  6.0  --   --  11.5*  --  12.1* 11.9*  HGB 7.5*   < > 9.6*   < > 9.5* 10.3* 10.2* 9.9* 8.7*  HCT 23.8*   < > 29.6*   < > 28.0* 30.8* 30.0* 30.6* 26.9*  MCV 86.5  --  87.8  --   --  87.3  --  87.7 88.5  PLT 219  --  203  --   --  237  --  248 203   < > = values in this interval not displayed.    Cardiac Enzymes: No results for input(s): CKTOTAL, CKMB, CKMBINDEX, TROPONINI in the last 168 hours.  BNP: BNP (last 3 results) No results for input(s): BNP in the last 8760 hours.  ProBNP (last 3 results) No results for input(s): PROBNP in the last 8760 hours.    Other results:  Imaging: CARDIAC CATHETERIZATION  Result Date: 07/09/2019  Prox Cx to Mid Cx lesion is 20% stenosed.  Findings: Ao = 104/46 (72) LV =  90/14 RA =  7 RV = 47/8 PA = 50/9 (21) PCW = 19 Fick cardiac output/index = 8.8/5.2 PVR = < 1.0 WU Ao sat = 100 PA sat = 74%, 75% Assessment: 1. LVEF 60-65% 2. Minimal CAD 3. Very mild PAH Plan/Discussion: Ok to proceed with thoracotomy for very large left-sided empyema. Glori Bickers, MD 3:22 PM   DG CHEST PORT 1 VIEW  Result Date: 07/10/2019 CLINICAL DATA:  Evaluate chest tube placement EXAM: PORTABLE CHEST 1 VIEW  COMPARISON:  07/09/2019 FINDINGS: There is a left IJ catheter with tip at the cavoatrial junction. Right IJ catheter sheath projects over the mid SVC. Left chest tube is in place without pneumothorax. Unchanged moderate left pleural effusion and small right pleural effusion. There is a thin lucency along the undersurface of the right hemidiaphragm. In retrospect this appears to have been present on the prior examination. Cannot rule out pneumoperitoneum. IMPRESSION: 1. Stable position of left chest tube. No pneumothorax. No change in bilateral pleural effusions. 2. Possible pneumoperitoneum. Correlation with recent surgical history. In the absence of known explanation for pneumoperitoneum, advise further evaluation with upright or left lateral decubitus radiographs to confirm Critical Value/emergent results were called by telephone at the time of interpretation on 07/10/2019 at 8:01 am to provider Nurse Jackelyn Poling, who verbally acknowledged these results. Electronically Signed   By: Kerby Moors M.D.   On: 07/10/2019 08:02   DG CHEST PORT 1 VIEW  Result Date: 07/09/2019 CLINICAL DATA:  Status post thoracotomy and evacuation of left empyema. EXAM: PORTABLE CHEST 1 VIEW COMPARISON:  07/08/2019 FINDINGS: Endotracheal tube remains with the tip approximately 1.3 cm above the carina. Swan-Ganz catheter tip remains in the proximal right pulmonary artery. Gastric decompression tube extends into the stomach. Single left chest tube shows stable positioning with no pneumothorax. Stable left lower lobe consolidation/atelectasis. Right lung remains clear. No edema. Stable mild cardiac enlargement. IMPRESSION: Stable left lower lobe consolidation/atelectasis. No pneumothorax. Electronically Signed   By: Aletta Edouard M.D.   On: 07/09/2019 08:44   DG CHEST PORT 1 VIEW  Result Date: 07/08/2019 CLINICAL DATA:  Endotracheally intubated. EXAM: PORTABLE CHEST 1 VIEW COMPARISON:  Radiograph and CT yesterday at [facility] .  FINDINGS: Endotracheal tube tip 15 mm from the carina. Enteric tube in place with tip below the diaphragm not included in the field of view. Right internal jugular Swan-Ganz catheter tip in the region of the  main pulmonary outflow tract. Left internal jugular central venous catheter tip in the lower SVC. Left chest tube in place. Left pleural effusion tracks superior laterally. Overall decreased opacity at the left lung base. No visualized pneumothorax. Mild cardiomegaly with unchanged mediastinal contours. Aortic atherosclerosis. Hazy opacity at the right lung base likely small right pleural effusion. Skin staples noted on the left. IMPRESSION: 1. Endotracheal tube tip 15 mm from the carina. Enteric tube, Swan-Ganz catheter, left central line in place. 2. Left chest tube in place. Small left pleural effusion tracks superior laterally. Overall decreased opacity at the left lung base. No pneumothorax. 3. Small right pleural effusion. Electronically Signed   By: Keith Rake M.D.   On: 07/08/2019 20:32   ECHOCARDIOGRAM COMPLETE  Result Date: 07/08/2019    ECHOCARDIOGRAM REPORT   Patient Name:   Connie Jarvis [facility] Date of Exam: 07/08/2019 Medical Rec #:  [id number]    Height:       60.0 in Accession #:    [id number]   Weight:       157.8 lb Date of Birth:  [birth year]     BSA:          1.69 m Patient Age:    79 years     BP:           109/59 mmHg Patient Gender: F            HR:           82 bpm. Exam Location:  Inpatient Procedure: 2D Echo Indications:    CHF-Acute Diastolic 818.56 / D14.97  History:        Patient has no prior history of Echocardiogram examinations.                 Mitral Valve Disease; Risk Factors:Hypertension. GERD. Empyema                 with extension into the skin.  Sonographer:    Darlina Sicilian RDCS Referring Phys: [place]  1. Left ventricular ejection fraction, by estimation, is 60 to 65%. The left ventricle has normal function. The left ventrical has no regional  wall motion abnormalities. Left ventricular diastolic parameters are consistent with Grade II diastolic dysfunction (pseudonormalization).  2. Right ventricular systolic function is normal. The right ventricular size is normal. There is moderately elevated pulmonary artery systolic pressure. The estimated right ventricular systolic pressure is 02.6 mmHg.  3. The mitral valve is normal in structure and function. Mild to moderate mitral valve regurgitation. No evidence of mitral stenosis.  4. Tricuspid valve regurgitation is mild to moderate.  5. The aortic valve is grossly normal. Aortic valve regurgitation is trivial . No aortic stenosis is present. FINDINGS  Left Ventricle: Left ventricular ejection fraction, by estimation, is 60 to 65%. The left ventricle has normal function. The left ventricle has no regional wall motion abnormalities. There is no left ventricular hypertrophy. Left ventricular diastolic parameters are consistent with Grade II diastolic dysfunction (pseudonormalization). Right Ventricle: The right ventricular size is normal. No increase in right ventricular wall thickness. Right ventricular systolic function is normal. There is moderately elevated pulmonary artery systolic pressure. The tricuspid regurgitant velocity is 3.46 m/s, and with an assumed right atrial pressure of 8 mmHg, the estimated right ventricular systolic pressure is 37.8 mmHg. Left Atrium: Left atrial size was normal in size. Right Atrium: Right atrial size was normal in size. Pericardium: There is no evidence of pericardial effusion. Mitral Valve: The  mitral valve is normal in structure and function. Mild to moderate mitral valve regurgitation. No evidence of mitral valve stenosis. Tricuspid Valve: The tricuspid valve is normal in structure. Tricuspid valve regurgitation is mild to moderate. Aortic Valve: The aortic valve is grossly normal.. There is mild thickening and mild calcification of the aortic valve. Aortic valve  regurgitation is trivial. No aortic stenosis is present. There is mild thickening of the aortic valve. There is mild calcification of the aortic valve. Pulmonic Valve: The pulmonic valve was grossly normal. Pulmonic valve regurgitation is not visualized. Aorta: The aortic root, ascending aorta and aortic arch are all structurally normal, with no evidence of dilitation or obstruction. IAS/Shunts: The atrial septum is grossly normal.  LEFT VENTRICLE PLAX 2D LVIDd:         4.60 cm  Diastology LVIDs:         3.20 cm  LV e' lateral:   6.96 cm/s LV PW:         0.90 cm  LV E/e' lateral: 10.5 LV IVS:        1.00 cm  LV e' medial:    5.00 cm/s LVOT diam:     1.80 cm  LV E/e' medial:  14.6 LV SV:         47.84 ml LV SV Index:   31.87 LVOT Area:     2.54 cm  LEFT ATRIUM           Index       RIGHT ATRIUM           Index LA diam:      4.40 cm 2.61 cm/m  RA Area:     11.80 cm LA Vol (A4C): 38.9 ml 23.05 ml/m RA Volume:   27.40 ml  16.24 ml/m  AORTIC VALVE LVOT Vmax:   99.80 cm/s LVOT Vmean:  63.400 cm/s LVOT VTI:    0.188 m  AORTA Ao Root diam: 2.60 cm MITRAL VALVE                        TRICUSPID VALVE MV Area (PHT): 3.77 cm             TR Peak grad:   47.9 mmHg MV Decel Time: 201 msec             TR Vmax:        346.00 cm/s MR Peak grad: 70.2 mmHg MR Mean grad: 45.0 mmHg             SHUNTS MR Vmax:      419.00 cm/s           Systemic VTI:  0.19 m MR Vmean:     321.0 cm/s            Systemic Diam: 1.80 cm MV E velocity: 72.80 cm/s 103 cm/s MV A velocity: 57.70 cm/s 70.3 cm/s MV E/A ratio:  1.26       1.5 Mertie Moores MD Electronically signed by Mertie Moores MD Signature Date/Time: 07/08/2019/10:56:10 AM    Final      Medications:     Scheduled Medications: . celecoxib  200 mg Per Tube BID  . chlorhexidine gluconate (MEDLINE KIT)  15 mL Mouth Rinse BID  . Chlorhexidine Gluconate Cloth  6 each Topical Daily  . docusate  100 mg Per Tube BID  . levalbuterol  0.63 mg Nebulization TID  . mouth rinse  15 mL Mouth  Rinse 10 times per day  .  mometasone-formoterol  2 puff Inhalation BID  . montelukast  10 mg Per Tube Daily  . pantoprazole sodium  40 mg Per Tube Daily  . sodium chloride flush  3 mL Intravenous Q12H    Infusions: . sodium chloride Stopped (07/08/19 0448)  . sodium chloride    . sodium chloride    . acetaminophen Stopped (07/10/19 0234)  . dexmedetomidine (PRECEDEX) IV infusion Stopped (07/09/19 0741)  . heparin 900 Units/hr (07/10/19 0700)  . norepinephrine (LEVOPHED) Adult infusion 2 mcg/min (07/10/19 0700)  . piperacillin-tazobactam (ZOSYN)  IV 12.5 mL/hr at 07/10/19 0700  . 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 Wound vac in place - GS with +++GPC. Culture pending - on broad spectrum abx  - TCTS managing. Possibly to OR for wound vac change today - Up to chair  2. PAH - very mild by cath - restart sildenafil as NE tolerates  3. PAF - currently in NSR with very frequent PACs - can start low dose b-blocker one off NE - now on heparin. Can hold as needed  4. Volume overload - CVP 6  - place TED hose - check daily weights - diurese gently  5. Hypokalemia/Hypomagnesemia - supp  Length of Stay: 3   Glori Bickers MD 07/10/2019, 8:46 AM  Advanced Heart Failure Team Pager [phone] (M-F; [place])  Please contact [facility] for night-coverage after hours (4p -7a ) and weekends on [email or web address]

## 2019-07-10 NOTE — Progress Notes (Signed)
[facility] for heparin Indication: atrial fibrillation  Allergies  Allergen Reactions  . Codeine Nausea And Vomiting  . Gluten Meal Diarrhea    Abdominal pain and bloating  . Lactose Diarrhea    Bloating and abdominal pain  . Neosporin [Neomycin-Bacitracin Zn-Polymyx]   . Other     Pain medication pt. Cannot remember the name.    Patient Measurements: Height: 5' (152.4 cm) Weight: 157 lb 12.2 oz (71.6 kg) IBW/kg (Calculated) : 45.5 Heparin Dosing Weight: 61kg  Vital Signs: Temp: 99.3 F (37.4 C) (02/13 1634) Temp Source: Oral (02/13 1634) BP: 117/63 (02/13 1800) Pulse Rate: 93 (02/13 1800)  Labs: Recent Labs    07/08/19 0244 07/08/19 2036 07/08/19 2036 07/08/19 2243 07/09/19 0427 07/09/19 0427 07/09/19 0430 07/09/19 1930 07/09/19 2337 07/10/19 0430 07/10/19 0856 07/10/19 1813  HGB  --  10.3*   < >  --  10.2*   < > 9.9*  --   --  8.7*  --   --   HCT  --  30.8*   < >  --  30.0*  --  30.6*  --   --  26.9*  --   --   PLT  --  237  --   --   --   --  248  --   --  203  --   --   HEPARINUNFRC  --   --   --   --   --   --   --   --    < > <0.10* <0.10* 0.18*  CREATININE   < >  --   --  0.67  --   --   --  0.79  --  0.75  --   --    < > = values in this interval not displayed.    Estimated Creatinine Clearance: 52.8 mL/min (by C-G formula based on SCr of 0.75 mg/dL).  Assessment: 79 year old female with Afib> currently in SR  s/p thoracotomy and drainage of empyema for heparin. Titrating heparin up slowly, remains subtherapeutic but trending up to 0.18.   Goal of Therapy:  Heparin level 0.3-0.5 units/ml Aim for ~0.3 Monitor platelets by anticoagulation protocol: Yes   Plan:  -Increase heparin to 1200 units/h -Recheck heparin level with morning labs   Arrie Senate, PharmD, BCPS Clinical Pharmacist [phone] Please check AMION for all [place] numbers 07/10/2019

## 2019-07-10 NOTE — Evaluation (Signed)
Physical Therapy Evaluation Patient Details Name: Connie Jarvis MRN: [id number] DOB: [birth year] Today's Date: 07/10/2019   History of Present Illness  79 y.o. female with medical history significant of O2 dependent COPD. Recently seen by GS for left upper chest wall lesion. S/p I&D and placed on Abx. Patient was to have further debridement of lesion but c/o SOB.  Seen in f/u by pulmonology. Patient found to have a large fungating mass, 8x20cm draining yellow fluid. CT scan of the chest revealed a large empyema measuring 13.4x12.5 cm with compressive atelectasis. Pt underwent thoracotomy with empyema drainage on 2/11.  Clinical Impression  Pt presents to PT with deficits in functional mobility, strength, power, gait, balance, endurance, and cardiopulmonary function. Pt is generally weak and requires significant physical assistance to perform bed mobility and safely transfer OOB. Pt fatigues quickly and session is limited by tachycardia with patient in A-fib to 160s with limited mobility. Pt will benefit from continued acute PT POC to improve activity tolerance and reduce falls risk.    Follow Up Recommendations SNF;Supervision/Assistance - 24 hour    Equipment Recommendations  Other (comment)(defer to post-acute setting)    Recommendations for Other Services       Precautions / Restrictions Precautions Precautions: Fall;Other (comment)(wound vac L thorax, chest tube) Restrictions Weight Bearing Restrictions: Yes RUE Weight Bearing: Non weight bearing(cath site)      Mobility  Bed Mobility Overal bed mobility: Needs Assistance Bed Mobility: Supine to Sit     Supine to sit: Mod assist;HOB elevated        Transfers Overall transfer level: Needs assistance Equipment used: 1 person hand held assist Transfers: Sit to/from [facility] Sit to Stand: Mod assist;From elevated surface Stand pivot transfers: Mod assist;From elevated surface          Ambulation/Gait                Stairs            Wheelchair Mobility    Modified Rankin (Stroke Patients Only)       Balance Overall balance assessment: Needs assistance Sitting-balance support: Bilateral upper extremity supported;Feet unsupported Sitting balance-Leahy Scale: Fair Sitting balance - Comments: minG with BUE support Postural control: Posterior lean Standing balance support: Bilateral upper extremity supported Standing balance-Leahy Scale: Poor Standing balance comment: modA with BUE support of PT                             Pertinent Vitals/Pain Pain Assessment: Faces Faces Pain Scale: Hurts little more Pain Location: L thorax Pain Descriptors / Indicators: Grimacing Pain Intervention(s): Limited activity within patient's tolerance    Home Living Family/patient expects to be discharged to:: Private residence Living Arrangements: Spouse/significant other Available Help at Discharge: Family;Available 24 hours/day(limited physical assistance) Type of Home: House Home Access: Ramped entrance;Stairs to enter(ramp is very long)   Technical brewer of Steps: 6 Home Layout: One level Home Equipment: Walker - 4 wheels;Grab bars - tub/shower;Grab bars - toilet      Prior Function Level of Independence: Independent with assistive device(s)         Comments: short household distances with rollator, limited by supplemental oxygen tank     Hand Dominance        Extremity/Trunk Assessment   Upper Extremity Assessment Upper Extremity Assessment: Generalized weakness    Lower Extremity Assessment Lower Extremity Assessment: Generalized weakness    Cervical / Trunk Assessment Cervical / Trunk Assessment:  Kyphotic  Communication   Communication: No difficulties  Cognition Arousal/Alertness: Awake/alert Behavior During Therapy: WFL for tasks assessed/performed Overall Cognitive Status: Within Functional Limits for tasks assessed                                         General Comments General comments (skin integrity, edema, etc.): pt tachy at rest into 130s pre-mobility, Pt HR up to 161 in A-fib with transfer to recliner. HR recovers to 130-140 with seated rest in recliner. Pt hypotensive with BP in 80s/60s map of 65 once in recliner, RN made aware, pt asymptomatic. Pt on 1L [place] during mobility    Exercises     Assessment/Plan    PT Assessment Patient needs continued PT services  PT Problem List Decreased strength;Decreased activity tolerance;Decreased balance;Decreased mobility;Decreased knowledge of use of DME;Decreased safety awareness;Decreased knowledge of precautions;Cardiopulmonary status limiting activity;Pain       PT Treatment Interventions DME instruction;Gait training;Stair training;Functional mobility training;Therapeutic activities;Therapeutic exercise;Balance training;Neuromuscular re-education;Patient/family education    PT Goals (Current goals can be found in the Care Plan section)  Acute Rehab PT Goals Patient Stated Goal: To go to rehab and get stronger PT Goal Formulation: With patient Time For Goal Achievement: 07/24/19 Potential to Achieve Goals: Good    Frequency Min 2X/week   Barriers to discharge        Co-evaluation               AM-PAC PT "6 Clicks" Mobility  Outcome Measure Help needed turning from your back to your side while in a flat bed without using bedrails?: A Lot Help needed moving from lying on your back to sitting on the side of a flat bed without using bedrails?: A Lot Help needed moving to and from a bed to a chair (including a wheelchair)?: A Lot Help needed standing up from a chair using your arms (e.g., wheelchair or bedside chair)?: A Lot Help needed to walk in hospital room?: Total Help needed climbing 3-5 steps with a railing? : Total 6 Click Score: 10    End of Session Equipment Utilized During Treatment: Oxygen Activity Tolerance: Patient  limited by fatigue Patient left: in chair;with call bell/phone within reach Nurse Communication: Mobility status PT Visit Diagnosis: Unsteadiness on feet (R26.81);Muscle weakness (generalized) (M62.81)    Time: 1025-1106 PT Time Calculation (min) (ACUTE ONLY): 41 min   Charges:   PT Evaluation $PT Eval High Complexity: 1 High PT Treatments $Therapeutic Activity: 8-22 mins        Zenaida Niece, PT, DPT Acute Rehabilitation Pager: [phone]   Zenaida Niece 07/10/2019, 2:21 PM

## 2019-07-11 ENCOUNTER — Inpatient Hospital Stay (HOSPITAL_COMMUNITY): Payer: Medicare Other | Admitting: Certified Registered Nurse Anesthetist

## 2019-07-11 ENCOUNTER — Encounter (HOSPITAL_COMMUNITY)
Admission: AD | Disposition: A | Discharge status: Rehabilitation Facility | Payer: Self-pay | Source: Other Acute Inpatient Hospital | Attending: Cardiothoracic Surgery

## 2019-07-11 ENCOUNTER — Inpatient Hospital Stay (HOSPITAL_COMMUNITY): Payer: Medicare Other

## 2019-07-11 HISTORY — PX: APPLICATION OF WOUND VAC: SHX5189

## 2019-07-11 LAB — BPAM RBC
Blood Product Expiration Date: 202103012359
Blood Product Expiration Date: 202103012359
Blood Product Expiration Date: 202103022359
Blood Product Expiration Date: 202103112359
Blood Product Expiration Date: 202103112359
Blood Product Expiration Date: 202103132359
Blood Product Expiration Date: 202103152359
Blood Product Expiration Date: 202103152359
ISSUE DATE / TIME: 202102111611
ISSUE DATE / TIME: 202102111611
ISSUE DATE / TIME: 202102111611
ISSUE DATE / TIME: 202102111611
Unit Type and Rh: 6200
Unit Type and Rh: 6200
Unit Type and Rh: 6200
Unit Type and Rh: 6200
Unit Type and Rh: 6200
Unit Type and Rh: 6200
Unit Type and Rh: 6200
Unit Type and Rh: 6200

## 2019-07-11 LAB — HEPARIN LEVEL (UNFRACTIONATED): Heparin Unfractionated: 0.3 IU/mL (ref 0.30–0.70)

## 2019-07-11 LAB — BASIC METABOLIC PANEL
Anion gap: 6 (ref 5–15)
BUN: 8 mg/dL (ref 8–23)
CO2: 27 mmol/L (ref 22–32)
Calcium: 7.3 mg/dL — ABNORMAL LOW (ref 8.9–10.3)
Chloride: 104 mmol/L (ref 98–111)
Creatinine, Ser: 0.58 mg/dL (ref 0.44–1.00)
GFR calc Af Amer: >60 mL/min (ref >60)
GFR calc non Af Amer: >60 mL/min (ref >60)
Glucose, Bld: 170 mg/dL — ABNORMAL HIGH (ref 70–99)
Potassium: 3.8 mmol/L (ref 3.5–5.1)
Sodium: 137 mmol/L (ref 135–145)

## 2019-07-11 LAB — COMPREHENSIVE METABOLIC PANEL
ALT: 13 U/L (ref 0–44)
AST: 18 U/L (ref 15–41)
Albumin: 1.6 g/dL — ABNORMAL LOW (ref 3.5–5.0)
Alkaline Phosphatase: 43 U/L (ref 38–126)
Anion gap: 7 (ref 5–15)
BUN: 8 mg/dL (ref 8–23)
CO2: 29 mmol/L (ref 22–32)
Calcium: 7.2 mg/dL — ABNORMAL LOW (ref 8.9–10.3)
Chloride: 104 mmol/L (ref 98–111)
Creatinine, Ser: 0.71 mg/dL (ref 0.44–1.00)
GFR calc Af Amer: >60 mL/min (ref >60)
GFR calc non Af Amer: >60 mL/min (ref >60)
Glucose, Bld: 131 mg/dL — ABNORMAL HIGH (ref 70–99)
Potassium: 3.4 mmol/L — ABNORMAL LOW (ref 3.5–5.1)
Sodium: 140 mmol/L (ref 135–145)
Total Bilirubin: 0.7 mg/dL (ref 0.3–1.2)
Total Protein: 5.1 g/dL — ABNORMAL LOW (ref 6.5–8.1)

## 2019-07-11 LAB — TYPE AND SCREEN
ABO/RH(D): A POS
Antibody Screen: NEGATIVE
Unit division: 0
Unit division: 0
Unit division: 0
Unit division: 0
Unit division: 0
Unit division: 0
Unit division: 0
Unit division: 0

## 2019-07-11 LAB — CBC
HCT: 26.6 % — ABNORMAL LOW (ref 36.0–46.0)
HCT: 27 % — ABNORMAL LOW (ref 36.0–46.0)
Hemoglobin: 8.6 g/dL — ABNORMAL LOW (ref 12.0–15.0)
Hemoglobin: 8.6 g/dL — ABNORMAL LOW (ref 12.0–15.0)
MCH: 28.4 pg (ref 26.0–34.0)
MCH: 28.4 pg (ref 26.0–34.0)
MCHC: 32.3 g/dL (ref 30.0–36.0)
MCHC: 31.9 g/dL (ref 30.0–36.0)
MCV: 87.8 fL (ref 80.0–100.0)
MCV: 89.1 fL (ref 80.0–100.0)
Platelets: 204 10*3/uL (ref 150–400)
Platelets: 197 10*3/uL (ref 150–400)
RBC: 3.03 MIL/uL — ABNORMAL LOW (ref 3.87–5.11)
RBC: 3.03 MIL/uL — ABNORMAL LOW (ref 3.87–5.11)
RDW: 17 % — ABNORMAL HIGH (ref 11.5–15.5)
RDW: 17 % — ABNORMAL HIGH (ref 11.5–15.5)
WBC: 10.5 10*3/uL (ref 4.0–10.5)
WBC: 9.8 10*3/uL (ref 4.0–10.5)
nRBC: 0 % (ref 0.0–0.2)
nRBC: 0 % (ref 0.0–0.2)

## 2019-07-11 SURGERY — APPLICATION, WOUND VAC
Anesthesia: General | Laterality: Left

## 2019-07-11 MED ORDER — HEPARIN (PORCINE) 25000 UT/250ML-% IV SOLN
1100.0000 [IU]/h | INTRAVENOUS | Status: DC
  Administered 2019-07-11 (×2): 1150 [IU]/h via INTRAVENOUS
  Administered 2019-07-12: 18:00:00 1100 [IU]/h via INTRAVENOUS
  Filled 2019-07-11 (×2): qty 250

## 2019-07-11 MED ORDER — FENTANYL CITRATE (PF) 250 MCG/5ML IJ SOLN
INTRAMUSCULAR | Status: AC
  Filled 2019-07-11: qty 5

## 2019-07-11 MED ORDER — ALBUMIN HUMAN 5 % IV SOLN: INTRAVENOUS | Status: DC | PRN

## 2019-07-11 MED ORDER — 0.9 % SODIUM CHLORIDE (POUR BTL) OPTIME
TOPICAL | Status: DC | PRN
  Administered 2019-07-11: 2000 mL

## 2019-07-11 MED ORDER — PHENYLEPHRINE HCL-NACL 10-0.9 MG/250ML-% IV SOLN
INTRAVENOUS | Status: DC | PRN
  Administered 2019-07-11: 25 ug/min via INTRAVENOUS

## 2019-07-11 MED ORDER — ROCURONIUM BROMIDE 100 MG/10ML IV SOLN
INTRAVENOUS | Status: DC | PRN
  Administered 2019-07-11: 40 mg via INTRAVENOUS

## 2019-07-11 MED ORDER — LIDOCAINE HCL (CARDIAC) PF 100 MG/5ML IV SOSY
PREFILLED_SYRINGE | INTRAVENOUS | Status: DC | PRN
  Administered 2019-07-11: 50 mg via INTRAVENOUS

## 2019-07-11 MED ORDER — DEXAMETHASONE SODIUM PHOSPHATE 4 MG/ML IJ SOLN
INTRAMUSCULAR | Status: DC | PRN
  Administered 2019-07-11: 5 mg via INTRAVENOUS

## 2019-07-11 MED ORDER — CEFAZOLIN SODIUM-DEXTROSE 2-3 GM-%(50ML) IV SOLR
INTRAVENOUS | Status: DC | PRN
  Administered 2019-07-11: 2 g via INTRAVENOUS

## 2019-07-11 MED ORDER — ONDANSETRON HCL 4 MG/2ML IJ SOLN
INTRAMUSCULAR | Status: DC | PRN
  Administered 2019-07-11: 4 mg via INTRAVENOUS

## 2019-07-11 MED ORDER — PROPOFOL 10 MG/ML IV BOLUS
INTRAVENOUS | Status: DC | PRN
  Administered 2019-07-11: 40 mg via INTRAVENOUS

## 2019-07-11 MED ORDER — SUGAMMADEX SODIUM 200 MG/2ML IV SOLN
INTRAVENOUS | Status: DC | PRN
  Administered 2019-07-11: 200 mg via INTRAVENOUS

## 2019-07-11 MED ORDER — PROPOFOL 10 MG/ML IV BOLUS
INTRAVENOUS | Status: AC
  Filled 2019-07-11: qty 20

## 2019-07-11 MED ORDER — LACTATED RINGERS IV SOLN: INTRAVENOUS | Status: DC | PRN

## 2019-07-11 MED ORDER — FENTANYL CITRATE (PF) 100 MCG/2ML IJ SOLN
INTRAMUSCULAR | Status: DC | PRN
  Administered 2019-07-11: 100 ug via INTRAVENOUS

## 2019-07-11 MED ORDER — SUCCINYLCHOLINE CHLORIDE 20 MG/ML IJ SOLN
INTRAMUSCULAR | Status: DC | PRN
  Administered 2019-07-11: 100 mg via INTRAVENOUS

## 2019-07-11 MED ORDER — POTASSIUM CHLORIDE 10 MEQ/50ML IV SOLN
10.0000 meq | INTRAVENOUS | Status: AC
  Administered 2019-07-11 (×2): 10 meq via INTRAVENOUS
  Filled 2019-07-11 (×2): qty 50

## 2019-07-11 SURGICAL SUPPLY — 30 items
APL SKNCLS STERI-STRIP NONHPOA (GAUZE/BANDAGES/DRESSINGS) ×2
BENZOIN TINCTURE PRP APPL 2/3 (GAUZE/BANDAGES/DRESSINGS) ×4 IMPLANT
CANISTER SUCT 3000ML PPV (MISCELLANEOUS) ×2 IMPLANT
CANISTER WOUNDNEG PRESSURE 500 (CANNISTER) ×2 IMPLANT
COVER SURGICAL LIGHT HANDLE (MISCELLANEOUS) ×4 IMPLANT
DRSG AQUACEL AG ADV 3.5X14 (GAUZE/BANDAGES/DRESSINGS) ×2 IMPLANT
DRSG TEGADERM 4X4.75 (GAUZE/BANDAGES/DRESSINGS) ×2 IMPLANT
DRSG VAC ATS LRG SENSATRAC (GAUZE/BANDAGES/DRESSINGS) ×2 IMPLANT
DRSG VERSA FOAM LRG 10X15 (GAUZE/BANDAGES/DRESSINGS) ×2 IMPLANT
ELECT CAUTERY BLADE 6.4 (BLADE) ×2 IMPLANT
ELECT REM PT RETURN 9FT ADLT (ELECTROSURGICAL) ×2
ELECTRODE REM PT RTRN 9FT ADLT (ELECTROSURGICAL) ×1 IMPLANT
GAUZE SPONGE 4X4 12PLY STRL (GAUZE/BANDAGES/DRESSINGS) ×2 IMPLANT
GLOVE NEODERM STRL 7.5 LF PF (GLOVE) ×2 IMPLANT
GLOVE SURG NEODERM 7.5  LF PF (GLOVE) ×2
GOWN STRL REUS W/ TWL LRG LVL3 (GOWN DISPOSABLE) ×2 IMPLANT
GOWN STRL REUS W/TWL LRG LVL3 (GOWN DISPOSABLE) ×4
HANDPIECE INTERPULSE COAX TIP (DISPOSABLE) ×2
KIT BASIN OR (CUSTOM PROCEDURE TRAY) ×2 IMPLANT
KIT TURNOVER KIT B (KITS) ×2 IMPLANT
NS IRRIG 1000ML POUR BTL (IV SOLUTION) ×4 IMPLANT
PACK GENERAL/GYN (CUSTOM PROCEDURE TRAY) ×2 IMPLANT
PAD ARMBOARD 7.5X6 YLW CONV (MISCELLANEOUS) ×4 IMPLANT
SET HNDPC FAN SPRY TIP SCT (DISPOSABLE) ×1 IMPLANT
STAPLER VISISTAT 35W (STAPLE) ×2 IMPLANT
SUT CHROMIC 3 0 SH 27 (SUTURE) ×4 IMPLANT
TAPE CLOTH SURG 4X10 WHT LF (GAUZE/BANDAGES/DRESSINGS) ×2 IMPLANT
TOWEL GREEN STERILE (TOWEL DISPOSABLE) ×4 IMPLANT
TUBE CONNECTING 12X1/4 (SUCTIONS) ×2 IMPLANT
WATER STERILE IRR 1000ML POUR (IV SOLUTION) ×2 IMPLANT

## 2019-07-11 NOTE — Anesthesia Postprocedure Evaluation (Signed)
Anesthesia Post Note  Patient: Connie Jarvis  Procedure(s) Performed: WOUND VAC CHANGE AND WOUND IRRIGATION AND SHARP DEBRIDEMENT (Left )     Patient location during evaluation: ICU Anesthesia Type: General Level of consciousness: awake and patient cooperative Pain management: pain level controlled Vital Signs Assessment: post-procedure vital signs reviewed and stable Respiratory status: spontaneous breathing, respiratory function stable and patient connected to face mask oxygen Cardiovascular status: blood pressure returned to baseline and stable Postop Assessment: no apparent nausea or vomiting Anesthetic complications: no    Last Vitals:  Vitals:   07/11/19 1015 07/11/19 1029  BP: (!) 114/95   Pulse: 75   Resp:    Temp:    SpO2: 95% 98%    Last Pain:  Vitals:   07/11/19 1005  TempSrc:   PainSc: (P) 3                  Denese Mentink

## 2019-07-11 NOTE — Brief Op Note (Signed)
07/11/2019  10:07 AM  PATIENT:  [name]  79 y.o. female  PRE-OPERATIVE DIAGNOSIS:  LEFT EMPYEMA AND CHEST WALL INFECTION  POST-OPERATIVE DIAGNOSIS:  LEFT EMPYEMA AND CHEST WALL INFECTION  PROCEDURE:  Procedure(s): WOUND VAC CHANGE AND WOUND IRRIGATION AND SHARP DEBRIDEMENT (Left)  SURGEON:  Surgeon(s) and Role:    * Wonda Olds, MD - Primary    * Ivin Poot, MD - Assisting  PHYSICIAN ASSISTANT: n/a  ASSISTANTS:staff  ANESTHESIA:   general  EBL: minimal  BLOOD ADMINISTERED:none  DRAINS: 1 Chest Tube(s) in the left pleural space   LOCAL MEDICATIONS USED:  NONE  SPECIMEN:  No Specimen  DISPOSITION OF SPECIMEN:  N/A  COUNTS:  YES  TOURNIQUET:  * No tourniquets in log *  DICTATION: .Note written in EPIC  PLAN OF CARE: Admit to inpatient   PATIENT DISPOSITION:  ICU - extubated and stable.   Delay start of Pharmacological VTE agent (>24hrs) due to surgical blood loss or risk of bleeding: no  Chris Narasimhan Z. Orvan Seen, [place]

## 2019-07-11 NOTE — Op Note (Signed)
Procedure(s): WOUND VAC CHANGE AND WOUND IRRIGATION AND SHARP DEBRIDEMENT Procedure Note  Connie Jarvis female 79 y.o. 07/11/2019  Procedure(s) and Anesthesia Type:    * WOUND VAC CHANGE AND WOUND IRRIGATION AND SHARP DEBRIDEMENT - General  Surgeon(s) and Role:    Wonda Olds, MD - Primary    * Ivin Poot, MD - Assisting   Indications: The patient is s/p left thoracotomy for drainage of left empyema. The wound was left open due to frankly infected soft tissue now treated with wound vac. Taken to OR for wound vac change, wound debridement.      Surgeon: Wonda Olds   Assistants: Ivin Poot, MD  Anesthesia: General endotracheal anesthesia  ASA Class: 3    Procedure Detail  WOUND VAC CHANGE AND WOUND IRRIGATION AND SHARP DEBRIDEMENT After informed consent, she is taken to the OR at [facility] on 07/11/19 and placed supine on OR table. Anesthesia was induced by GET in a smooth fashion. The patient was placed in right lateral decubitus position. The left chest was cleansed and draped sterilely. A preoperative pause was performed. The existing vac sponge was taken down and the wound inspected. There was good granulation tissue throughout. Copious irrigation was performed. Brief consideration for LLL pulmonary decortication was made, but aborted due to the fragility of underlying lung. The chest tube was reinserted in the left pleural space. The wound was fit with a white followed by black wound vac foam. Sterile dressings were applied. All sponge, instrument, and needle counts were correct.   Estimated Blood Loss:  Minimal         Drains: one chest tube         Blood Given: none          Specimens: none         Implants: none        Complications:  * No complications entered in OR log *         Disposition: ICU - extubated and stable.         Condition: stable

## 2019-07-11 NOTE — Progress Notes (Signed)
Day of Surgery Procedure(s) (LRB): WOUND VAC CHANGE AND WOUND IRRIGATION AND SHARP DEBRIDEMENT (Left) Subjective: Patient tolerated debridement and wound VAC change of thoracic wound well in OR. Patient on heparin for atrial fibrillation and will recheck CBC this p.m. Wound VAC on suction and secure Blood pressure stable on low-dose Levophed  Objective: Vital signs in last 24 hours: Temp:  [97.4 F (36.3 C)-99.3 F (37.4 C)] 97.4 F (36.3 C) (02/14 1111) Pulse Rate:  [40-127] 74 (02/14 1215) Cardiac Rhythm: Atrial fibrillation (02/14 0730) Resp:  [18] 18 (02/13 1535) BP: (69-147)/(46-109) 102/69 (02/14 1215) SpO2:  [91 %-100 %] 94 % (02/14 1215) Weight:  [76 kg] 76 kg (02/14 0300)  Hemodynamic parameters for last 24 hours:    Intake/Output from previous day: 02/13 0701 - 02/14 0700 In: 1942.6 [P.O.:270; I.V.:1236.1; IV Piggyback:436.5] Out: 1415 [Urine:905; Drains:450; Chest Tube:60] Intake/Output this shift: Total I/O In: 323.9 [I.V.:73.9; IV Piggyback:250] Out: 70 [Urine:50; Blood:20]       Exam    General- alert and comfortable    Neck- no JVD, no cervical adenopathy palpable, no carotid bruit   Lungs- clear without rales, wheezes.  Large left chest wound with wound VAC placed on suction   Cor-atrial fibrillation, no murmur , gallop   Abdomen- soft, non-tender   Extremities - warm, non-tender, minimal edema   Neuro- oriented, appropriate, no focal weakness   Lab Results: Recent Labs    07/10/19 0430 07/11/19 0338  WBC 11.9* 10.5  HGB 8.7* 8.6*  HCT 26.9* 26.6*  PLT 203 204   BMET:  Recent Labs    07/10/19 0430 07/11/19 0338  NA 141 140  K 4.2 3.4*  CL 105 104  CO2 27 29  GLUCOSE 70 131*  BUN 7* 8  CREATININE 0.75 0.71  CALCIUM 7.6* 7.2*    PT/INR: No results for input(s): LABPROT, INR in the last 72 hours. ABG    Component Value Date/Time   PHART 7.391 07/09/2019 0427   HCO3 28.5 (H) 07/09/2019 0427   TCO2 30 07/09/2019 0427   O2SAT 99.0  07/09/2019 0427   CBG (last 3)  Recent Labs    07/08/19 2030 07/08/19 2256 07/09/19 0429  GLUCAP 66* 85 93    Assessment/Plan: S/P Procedure(s) (LRB): WOUND VAC CHANGE AND WOUND IRRIGATION AND SHARP DEBRIDEMENT (Left) Continue to support patient for wound care and probable tissue flap coverage of thoracic wound.  Patient discussed with Dr.  Audelia Hives for consultation.  LOS: 4 days    Connie Jarvis 07/11/2019

## 2019-07-11 NOTE — Progress Notes (Signed)
CT surgery p.m. Rounds  Patient feeling well after wound debridement and wound VAC change Minimal serosanguineous drainage from wound VAC Postoperative hemoglobin stable at 8.6 Rate controlled atrial fibrillation with stable blood pressure

## 2019-07-11 NOTE — Anesthesia Procedure Notes (Signed)
Procedure Name: Intubation Date/Time: 07/11/2019 8:48 AM Performed by: Oletta Lamas, CRNA Pre-anesthesia Checklist: Patient identified, Emergency Drugs available, Suction available and Patient being monitored Patient Re-evaluated:Patient Re-evaluated prior to induction Oxygen Delivery Method: Circle System Utilized Preoxygenation: Pre-oxygenation with 100% oxygen Induction Type: IV induction Ventilation: Mask ventilation without difficulty Laryngoscope Size: Miller and 2 Grade View: Grade I Tube type: Oral Number of attempts: 1 Airway Equipment and Method: Stylet Placement Confirmation: ETT inserted through vocal cords under direct vision,  positive ETCO2 and breath sounds checked- equal and bilateral Secured at: 20 cm Tube secured with: Tape Dental Injury: Teeth and Oropharynx as per pre-operative assessment

## 2019-07-11 NOTE — Plan of Care (Signed)
Pt is alert and oriented, oxygen saturation at 100% on 1L [place], breathing is even and unlabored. Pt has had an intermittent dry, strong cough throughout the night with very little secretions. Pt is able to move all extremities but has generalized weakness and reports using a walker/scooter at bedside. Pt denies pain. Pt was given 2 CHG per preprocedure orders per protocol (consent orders/NPO orders active for possible surgery later on today). Potassium replacement was ordered per protocol. Pt has had moderate amount of wound vac drainage, minimal chest tube drainage, and low urine output throughout the night. Pt denies lower abdominal pain. Pt's blood pressure remains soft when levo gtt is decreased below 5 mics, will wean as tolerated. No complaints or concerns voiced at this time. Call bell is within reach and bed is in lowest position.  Problem: Education: Goal: Knowledge of General Education information will improve Description: Including pain rating scale, medication(s)/side effects and non-pharmacologic comfort measures Outcome: Progressing   Problem: Clinical Measurements: Goal: Diagnostic test results will improve Outcome: Progressing Goal: Respiratory complications will improve Outcome: Progressing Goal: Cardiovascular complication will be avoided Outcome: Progressing   Problem: Elimination: Goal: Will not experience complications related to bowel motility Outcome: Progressing   Problem: Pain Managment: Goal: General experience of comfort will improve Outcome: Progressing

## 2019-07-11 NOTE — Progress Notes (Signed)
Advanced Heart Failure Rounding Note   Subjective:    S/p thoracotomy on 2/11. Now with wound vac and chest tube in place.   Developed AF with RVR yesterday. IV amio started.   Went to OR for wound vac change.   Feels ok. Sore. Denies CP or SOB. No fevers. Off NE  Pleural fluid GS +++GPC. NGTD   Objective:   Weight Range:  Vital Signs:   Temp:  [97.5 F (36.4 C)-99.3 F (37.4 C)] 97.5 F (36.4 C) (02/14 0753) Pulse Rate:  [36-148] 75 (02/14 1015) Resp:  [18] 18 (02/13 1535) BP: (67-147)/(46-109) 114/95 (02/14 1015) SpO2:  [65 %-100 %] 98 % (02/14 1029) Weight:  [76 kg] 76 kg (02/14 0300) Last BM Date: 07/10/19  Weight change: Filed Weights   07/07/19 1826 07/07/19 1837 07/11/19 0300  Weight: 71.6 kg 71.6 kg 76 kg    Intake/Output:   Intake/Output Summary (Last 24 hours) at 07/11/2019 1055 Last data filed at 07/11/2019 1001 Gross per 24 hour  Intake 2125.76 ml  Output 1410 ml  Net 715.76 ml     Physical Exam: General:  Lying in bedd. No resp difficulty HEENT: normal Neck: supple. nLIJ TLC Carotids 2+ bilat; no bruits. No lymphadenopathy or thryomegaly appreciated. Cor: PMI nondisplaced. Regular rate & rhythm. No rubs, gallops or murmurs. Lungs: clear Left wound vac and CT Abdomen: soft, nontender, nondistended. No hepatosplenomegaly. No bruits or masses. Good bowel sounds. Extremities: no cyanosis, clubbing, rash, 1+ edema Neuro: alert & orientedx3, cranial nerves grossly intact. moves all 4 extremities w/o difficulty. Affect pleasant   Telemetry: NSR 60-70s Personally reviewed   Labs: Basic Metabolic Panel: Recent Labs  Lab 07/08/19 0244 07/08/19 1334 07/08/19 2243 07/08/19 2243 07/09/19 0427 07/09/19 0430 07/09/19 1930 07/10/19 0430 07/11/19 0338  NA 141   < > 141  --  142  --  143 141 140  K 3.6   < > 3.6  --  4.0  --  3.4* 4.2 3.4*  CL 102  --  105  --   --   --  106 105 104  CO2 33*  --  26  --   --   --  _0 GLUCOSE 78  --   89  --   --   --  67* 70 131*  BUN 8  --  7*  --   --   --  8 7* 8  CREATININE 0.53  --  0.67  --   --   --  0.79 0.75 0.71  CALCIUM 7.9*  --  7.8*   < >  --   --  7.7* 7.6* 7.2*  MG  --   --   --   --   --  1.2*  --  1.1*  --    < > = values in this interval not displayed.    Liver Function Tests: Recent Labs  Lab 07/11/19 0338  AST 18  ALT 13  ALKPHOS 43  BILITOT 0.7  PROT 5.1*  ALBUMIN 1.6*   No results for input(s): LIPASE, AMYLASE in the last 168 hours. No results for input(s): AMMONIA in the last 168 hours.  CBC: Recent Labs  Lab 07/08/19 1823 07/08/19 1824 07/08/19 2036 07/09/19 0427 07/09/19 0430 07/10/19 0430 07/11/19 0338  WBC 6.0  --  11.5*  --  12.1* 11.9* 10.5  HGB 9.6*   < > 10.3* 10.2* 9.9* 8.7* 8.6*  HCT 29.6*   < >  30.8* 30.0* 30.6* 26.9* 26.6*  MCV 87.8  --  87.3  --  87.7 88.5 87.8  PLT 203  --  237  --  248 203 204   < > = values in this interval not displayed.    Cardiac Enzymes: No results for input(s): CKTOTAL, CKMB, CKMBINDEX, TROPONINI in the last 168 hours.  BNP: BNP (last 3 results) No results for input(s): BNP in the last 8760 hours.  ProBNP (last 3 results) No results for input(s): PROBNP in the last 8760 hours.    Other results:  Imaging: DG Chest Port 1 View  Result Date: 07/11/2019 CLINICAL DATA:  Follow-up chest tube EXAM: PORTABLE CHEST 1 VIEW COMPARISON:  07/10/2019 FINDINGS: Left thoracostomy catheter is again seen and stable. The catheter is kinked at the chest wall level. No pneumothorax is seen. Persistent left pleural effusion and left basilar consolidation is seen. Small right pleural effusion is noted. Left jugular central line is noted with the tip at the cavoatrial junction. IMPRESSION: Stable appearance of the chest when compared with the prior exam. Previously seen lucency under the right hemidiaphragm is not appreciated on today's exam. Electronically Signed   By: Inez Catalina M.D.   On: 07/11/2019 09:12   DG  CHEST PORT 1 VIEW  Result Date: 07/10/2019 CLINICAL DATA:  Evaluate chest tube placement EXAM: PORTABLE CHEST 1 VIEW COMPARISON:  07/09/2019 FINDINGS: There is a left IJ catheter with tip at the cavoatrial junction. Right IJ catheter sheath projects over the mid SVC. Left chest tube is in place without pneumothorax. Unchanged moderate left pleural effusion and small right pleural effusion. There is a thin lucency along the undersurface of the right hemidiaphragm. In retrospect this appears to have been present on the prior examination. Cannot rule out pneumoperitoneum. IMPRESSION: 1. Stable position of left chest tube. No pneumothorax. No change in bilateral pleural effusions. 2. Possible pneumoperitoneum. Correlation with recent surgical history. In the absence of known explanation for pneumoperitoneum, advise further evaluation with upright or left lateral decubitus radiographs to confirm Critical Value/emergent results were called by telephone at the time of interpretation on 07/10/2019 at 8:01 am to provider Nurse Jackelyn Poling, who verbally acknowledged these results. Electronically Signed   By: Kerby Moors M.D.   On: 07/10/2019 08:02     Medications:     Scheduled Medications: . celecoxib  200 mg Per Tube BID  . chlorhexidine gluconate (MEDLINE KIT)  15 mL Mouth Rinse BID  . Chlorhexidine Gluconate Cloth  6 each Topical Daily  . docusate sodium  100 mg Oral Daily  . feeding supplement (ENSURE ENLIVE)  237 mL Oral TID WC  . furosemide  20 mg Intravenous Once  . levalbuterol  0.63 mg Nebulization TID  . mouth rinse  15 mL Mouth Rinse BID  . mometasone-formoterol  2 puff Inhalation BID  . montelukast  10 mg Per Tube Daily  . pantoprazole  40 mg Oral Daily  . sildenafil  20 mg Oral TID    Infusions: . sodium chloride Stopped (07/08/19 0448)  . sodium chloride    . amiodarone 30 mg/hr (07/11/19 1025)  . heparin    . norepinephrine (LEVOPHED) Adult infusion Stopped (07/11/19 0825)  .  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 Wound vac in place - GS with +++GPC. Culture NGTD - on broad spectrum abx  - TCTS managing. S/p  wound vac change today. Will likely need omental flap  2. PAH - very mild by cath - back on sildenafil   3. PAF - currently in NSR on IV amio  - continue amio & heparin. Discussed dosing with PharmD personally.  4. Volume overload - CVP 6-7 - place TED hose - check daily weights - diurese gently as toelrated  5. Hypokalemia/Hypomagnesemia - k 3.4 supp  6. Severe protein calorie malnutrition - will get Nutrition consult  Length of Stay: 4   Glori Bickers MD 07/11/2019, 10:55 AM  Advanced Heart Failure Team Pager [phone] (M-F; [place])  Please contact [facility] for night-coverage after hours (4p -7a ) and weekends on [email or web address]

## 2019-07-11 NOTE — Progress Notes (Signed)
[place] for heparin Indication: atrial fibrillation  Allergies  Allergen Reactions  . Codeine Nausea And Vomiting  . Gluten Meal Diarrhea    Abdominal pain and bloating  . Lactose Diarrhea    Bloating and abdominal pain  . Neosporin [Neomycin-Bacitracin Zn-Polymyx]   . Other     Pain medication pt. Cannot remember the name.    Patient Measurements: Height: 5' (152.4 cm) Weight: 167 lb 8.8 oz (76 kg) IBW/kg (Calculated) : 45.5 Heparin Dosing Weight: 61kg  Vital Signs: Temp: 97.4 F (36.3 C) (02/14 1111) Temp Source: Oral (02/14 1111) BP: 102/69 (02/14 1215) Pulse Rate: 74 (02/14 1215)  Labs: Recent Labs    07/08/19 2243 07/09/19 0430 07/09/19 1930 07/09/19 2337 07/10/19 0430 07/10/19 0430 07/10/19 0856 07/10/19 1813 07/11/19 0338  HGB   < > 9.9*  --   --  8.7*  --   --   --  8.6*  HCT  --  30.6*  --   --  26.9*  --   --   --  26.6*  PLT  --  248  --   --  203  --   --   --  204  HEPARINUNFRC  --   --   --    < > <0.10*   < > <0.10* 0.18* 0.30  CREATININE   < >  --  0.79  --  0.75  --   --   --  0.71   < > = values in this interval not displayed.    Estimated Creatinine Clearance: 54.5 mL/min (by C-G formula based on SCr of 0.71 mg/dL).  Assessment: 79 year old female  s/p thoracotomy and drainage of empyema - VAC changed today.  Currently on heparin for in/out AFib. Titrating heparin up slowly drip 1200 uts/hr HL0.3 this am.  Heparin off for VAC change this am will restart at lower rate and increase as needed.    H/h low stable - continue to watch   Goal of Therapy:  Heparin level 0.3-0.5 units/ml Aim for ~0.3 Monitor platelets by anticoagulation protocol: Yes   Plan:  -Restart heparin 1150 units/h -Recheck heparin level with morning labs   Bonnita Nasuti Pharm.D. CPP, BCPS Clinical Pharmacist [phone] 07/11/2019 1:26 PM    Please check AMION for all [employer] numbers 07/11/2019

## 2019-07-11 NOTE — Anesthesia Preprocedure Evaluation (Signed)
Anesthesia Evaluation  Patient identified by MRN, date of birth, ID band Patient awake    Reviewed: Allergy & Precautions, NPO status , Patient's Chart, lab work & pertinent test results  History of Anesthesia Complications (+) history of anesthetic complications  Airway Mallampati: II  TM Distance: <3 FB Neck ROM: Full    Dental  (+) Teeth Intact, Dental Advisory Given   Pulmonary    + rhonchi  + decreased breath sounds+ wheezing      Cardiovascular hypertension,  Rhythm:Regular Rate:Normal     Neuro/Psych    GI/Hepatic   Endo/Other    Renal/GU      Musculoskeletal   Abdominal   Peds  Hematology   Anesthesia Other Findings   Reproductive/Obstetrics                             Anesthesia Physical Anesthesia Plan  ASA: III  Anesthesia Plan: General   Post-op Pain Management:    Induction: Intravenous  PONV Risk Score and Plan: 3  Airway Management Planned: Oral ETT  Additional Equipment:   Intra-op Plan:   Post-operative Plan: Extubation in OR  Informed Consent: I have reviewed the patients History and Physical, chart, labs and discussed the procedure including the risks, benefits and alternatives for the proposed anesthesia with the patient or authorized representative who has indicated his/her understanding and acceptance.     Dental advisory given  Plan Discussed with: CRNA and Surgeon  Anesthesia Plan Comments:         Anesthesia Quick Evaluation

## 2019-07-11 NOTE — H&P (Signed)
History and Physical Interval Note:  07/11/2019 8:09 AM  Connie Jarvis  has presented today for surgery, with the diagnosis of LEFT EMPYEMA.  The various methods of treatment have been discussed with the patient and family. After consideration of risks, benefits and other options for treatment, the patient has consented to  PROCEDURE: LEFT CHEST WOUND EXPLORATION, IRRIGATION AND DEBRIDEMENT WITH WOUND VAC CHANGE; POSSIBLE PULMONARY DECORTICATION as a surgical intervention.  The patient's history has been reviewed, patient examined, no change in status, stable for surgery.  I have reviewed the patient's chart and labs.  Questions were answered to the patient's satisfaction.     Wonda Olds

## 2019-07-11 NOTE — Transfer of Care (Signed)
Immediate Anesthesia Transfer of Care Note  Patient: Connie Jarvis  Procedure(s) Performed: WOUND VAC CHANGE AND WOUND IRRIGATION AND SHARP DEBRIDEMENT (Left )  Patient Location: ICU Anesthesia Type:General  Level of Consciousness: awake, alert , oriented and patient cooperative  Airway & Oxygen Therapy: Patient Spontanous Breathing and Patient connected to face mask oxygen  Post-op Assessment: Report given to RN and Post -op Vital signs reviewed and stable  Post vital signs: Reviewed and stable  Last Vitals:  Vitals Value Taken Time  BP 114/95 07/11/19 1015  Temp    Pulse 73 07/11/19 1023  Resp 17 07/11/19 1003  SpO2 95 % 07/11/19 1023  Vitals shown include unvalidated device data.  Last Pain:  Vitals:   07/11/19 0753  TempSrc: Oral  PainSc:       Patients Stated Pain Goal: 0 ([id number])  Complications: No apparent anesthesia complications   Atkins at bedside

## 2019-07-12 ENCOUNTER — Inpatient Hospital Stay (HOSPITAL_COMMUNITY): Payer: Medicare Other

## 2019-07-12 ENCOUNTER — Encounter (HOSPITAL_COMMUNITY): Payer: Self-pay | Admitting: Internal Medicine

## 2019-07-12 DIAGNOSIS — L8990 Pressure ulcer of unspecified site, unspecified stage: Secondary | ICD-10-CM

## 2019-07-12 LAB — CBC
HCT: 25 % — ABNORMAL LOW (ref 36.0–46.0)
Hemoglobin: 8.1 g/dL — ABNORMAL LOW (ref 12.0–15.0)
MCH: 28.9 pg (ref 26.0–34.0)
MCHC: 32.4 g/dL (ref 30.0–36.0)
MCV: 89.3 fL (ref 80.0–100.0)
Platelets: 223 10*3/uL (ref 150–400)
RBC: 2.8 MIL/uL — ABNORMAL LOW (ref 3.87–5.11)
RDW: 17.2 % — ABNORMAL HIGH (ref 11.5–15.5)
WBC: 8.5 10*3/uL (ref 4.0–10.5)
nRBC: 0 % (ref 0.0–0.2)

## 2019-07-12 LAB — COMPREHENSIVE METABOLIC PANEL
ALT: 13 U/L (ref 0–44)
AST: 18 U/L (ref 15–41)
Albumin: 1.8 g/dL — ABNORMAL LOW (ref 3.5–5.0)
Alkaline Phosphatase: 47 U/L (ref 38–126)
Anion gap: 9 (ref 5–15)
BUN: 7 mg/dL — ABNORMAL LOW (ref 8–23)
CO2: 28 mmol/L (ref 22–32)
Calcium: 7.5 mg/dL — ABNORMAL LOW (ref 8.9–10.3)
Chloride: 102 mmol/L (ref 98–111)
Creatinine, Ser: 0.6 mg/dL (ref 0.44–1.00)
GFR calc Af Amer: >60 mL/min (ref >60)
GFR calc non Af Amer: >60 mL/min (ref >60)
Glucose, Bld: 113 mg/dL — ABNORMAL HIGH (ref 70–99)
Potassium: 4.1 mmol/L (ref 3.5–5.1)
Sodium: 139 mmol/L (ref 135–145)
Total Bilirubin: 0.7 mg/dL (ref 0.3–1.2)
Total Protein: 5 g/dL — ABNORMAL LOW (ref 6.5–8.1)

## 2019-07-12 LAB — HEPARIN LEVEL (UNFRACTIONATED)
Heparin Unfractionated: 0.46 IU/mL (ref 0.30–0.70)
Heparin Unfractionated: 0.32 IU/mL (ref 0.30–0.70)

## 2019-07-12 LAB — SURGICAL PCR SCREEN
MRSA, PCR: NEGATIVE
Staphylococcus aureus: NEGATIVE

## 2019-07-12 LAB — SURGICAL PATHOLOGY

## 2019-07-12 MED ORDER — CEFAZOLIN SODIUM-DEXTROSE 2-4 GM/100ML-% IV SOLN
2.0000 g | INTRAVENOUS | Status: AC
  Administered 2019-07-13: 2 g via INTRAVENOUS
  Filled 2019-07-12: qty 100

## 2019-07-12 NOTE — Progress Notes (Addendum)
Advanced Heart Failure Rounding Note   Subjective:    S/p thoracotomy on 2/11. Now with wound vac and chest tube in place.   Went to OR 2/14 for wound vac change.   Pleural fluid GS +++GPC. NGTD  Back in A fib. Remains on amio 30 mg per hour.   Complaining of fatigue. Denies SOB.    Objective:   Weight Range:  Vital Signs:   Temp:  [97.4 F (36.3 C)-98.3 F (36.8 C)] 97.8 F (36.6 C) (02/15 0744) Pulse Rate:  [32-144] 112 (02/15 0700) Resp:  [18] 18 (02/14 2008) BP: (85-131)/(52-98) 85/70 (02/15 0700) SpO2:  [91 %-100 %] 100 % (02/15 0700) Weight:  [77.5 kg] 77.5 kg (02/15 0400) Last BM Date: 07/11/19  Weight change: Filed Weights   07/07/19 1837 07/11/19 0300 07/12/19 0400  Weight: 71.6 kg 76 kg 77.5 kg    Intake/Output:   Intake/Output Summary (Last 24 hours) at 07/12/2019 1007 Last data filed at 07/12/2019 0907 Gross per 24 hour  Intake 1367.33 ml  Output 901 ml  Net 466.33 ml     Physical Exam: CVP 5 General: Elderly Appears chronically ill. No resp difficulty HEENT: normal anicteric Neck: supple. no JVD. Carotids 2+ bilat; no bruits. No lymphadenopathy or thryomegaly appreciated. Cor: PMI nondisplaced. IRR IRR. No rubs, gallops or murmurs. Lungs: Decreased on left. CTx1.  Wound vac in place Abdomen: soft, nontender, nondistended. No hepatosplenomegaly. No bruits or masses. Good bowel sounds. Extremities: no cyanosis, clubbing, rash, R and LLE ted hose 1+ edema Neuro: alert & oriented x 3, cranial nerves grossly intact. moves all 4 extremities w/o difficulty. Affect pleasant SKIN: Vac dressing left upper chest with bloody exudate   Telemetry: A Fib with NSVT 80-100s    Labs: Basic Metabolic Panel: Recent Labs  Lab 07/08/19 2243 07/09/19 0430 07/09/19 1930 07/09/19 1930 07/10/19 0430 07/10/19 0430 07/11/19 0338 07/11/19 1714 07/12/19 0501  NA   < >  --  143  --  141  --  140 137 139  K   < >  --  3.4*  --  4.2  --  3.4* 3.8 4.1  CL    < >  --  106  --  105  --  104 104 102  CO2   < >  --  26  --  27  --  29 27 28   GLUCOSE   < >  --  67*  --  70  --  131* 170* 113*  BUN   < >  --  8  --  7*  --  8 8 7*  CREATININE   < >  --  0.79  --  0.75  --  0.71 0.58 0.60  CALCIUM   < >  --  7.7*   < > 7.6*   < > 7.2* 7.3* 7.5*  MG  --  1.2*  --   --  1.1*  --   --   --   --    < > = values in this interval not displayed.    Liver Function Tests: Recent Labs  Lab 07/11/19 0338 07/12/19 0501  AST 18 18  ALT 13 13  ALKPHOS 43 47  BILITOT 0.7 0.7  PROT 5.1* 5.0*  ALBUMIN 1.6* 1.8*   No results for input(s): LIPASE, AMYLASE in the last 168 hours. No results for input(s): AMMONIA in the last 168 hours.  CBC: Recent Labs  Lab 07/09/19 0430 07/10/19 0430 07/11/19 6812  07/11/19 1623 07/12/19 0501  WBC 12.1* 11.9* 10.5 9.8 8.5  HGB 9.9* 8.7* 8.6* 8.6* 8.1*  HCT 30.6* 26.9* 26.6* 27.0* 25.0*  MCV 87.7 88.5 87.8 89.1 89.3  PLT 248 203 204 197 223    Cardiac Enzymes: No results for input(s): CKTOTAL, CKMB, CKMBINDEX, TROPONINI in the last 168 hours.  BNP: BNP (last 3 results) No results for input(s): BNP in the last 8760 hours.  ProBNP (last 3 results) No results for input(s): PROBNP in the last 8760 hours.    Other results:  Imaging: CT ABDOMEN PELVIS WO CONTRAST  Result Date: 07/12/2019 CLINICAL DATA:  Preoperative evaluation for omental flap repair of thoracic defect. EXAM: CT ABDOMEN AND PELVIS WITHOUT CONTRAST TECHNIQUE: Multidetector CT imaging of the abdomen and pelvis was performed following the standard protocol without IV contrast. COMPARISON:  07/07/2019 FINDINGS: Lower chest: Bilateral lower lobe collapse/consolidation. Lower left thoracic wall wound noted with left chest tube in situ. Packing material and gas noted in the lower left pleural space. Hepatobiliary: No focal abnormality in the liver on this study without intravenous contrast. 11 mm calcified gallstone evident. No intrahepatic or  extrahepatic biliary dilation. Pancreas: No focal mass lesion. No dilatation of the main duct. No intraparenchymal cyst. No peripancreatic edema. Spleen: No splenomegaly. No focal mass lesion. Adrenals/Urinary Tract: No adrenal nodule or mass. No hydronephrosis in either kidney. No evidence for hydroureter. Bladder decompressed by Foley catheter. Stomach/Bowel: Stomach is unremarkable. No gastric wall thickening. No evidence of outlet obstruction. Duodenum is normally positioned as is the ligament of Treitz. No small bowel wall thickening. No small bowel dilatation. No colonic dilatation. Vascular/Lymphatic: There is abdominal aortic atherosclerosis without aneurysm. There is no gastrohepatic or hepatoduodenal ligament lymphadenopathy. No retroperitoneal or mesenteric lymphadenopathy. No pelvic sidewall lymphadenopathy. Reproductive: The uterus is unremarkable.  There is no adnexal mass. Other: Small volume free fluid noted adjacent to the liver and spleen. Small volume free fluid noted in the cul-de-sac. Musculoskeletal: Diffuse body wall edema. No worrisome lytic or sclerotic osseous abnormality. Mild anterolisthesis noted L4 on 5 and L5 on S1. IMPRESSION: 1. No evidence for abdominal wall or intra-abdominal mass lesion 2. Diffuse body wall edema. 3. Left-sided open thoracic surgical wound status post left thoracotomy for empyema drainage. 4. Cholelithiasis. 5.  Aortic Atherosclerois (ICD10-170.0) Electronically Signed   By: Misty Stanley M.D.   On: 07/12/2019 09:23   DG Chest Port 1 View  Result Date: 07/12/2019 CLINICAL DATA:  Pneumothorax on left. Additional history provided: Chest tube present, pneumothorax, lung surgery EXAM: PORTABLE CHEST 1 VIEW COMPARISON:  Chest radiograph 07/11/2019 FINDINGS: Unchanged position of a left IJ approach central venous catheter with tip projecting in the region of the upper right atrium. Unchanged position of a left-sided chest tube. As before, the chest tube is kinked at  the level of the thoracic wall. Overlying cardiac monitoring leads. The cardiomediastinal silhouette is unchanged. Aortic atherosclerosis. No visible pneumothorax. Persistent left pleural effusion with associated left basilar atelectasis and/or consolidation. Persistent trace right pleural effusion. IMPRESSION: No significant interval change as compared to chest radiograph 07/11/2019, as detailed. As before, the left-sided chest tube appears slightly kinked at the level of the chest wall. No visible pneumothorax. Redemonstrated left pleural effusion with left basilar atelectasis and/or consolidation. Persistent small right pleural effusion. Electronically Signed   By: Kellie Simmering DO   On: 07/12/2019 07:47   DG Chest Port 1 View  Result Date: 07/11/2019 CLINICAL DATA:  Follow-up chest tube EXAM: PORTABLE CHEST 1 VIEW  COMPARISON:  07/10/2019 FINDINGS: Left thoracostomy catheter is again seen and stable. The catheter is kinked at the chest wall level. No pneumothorax is seen. Persistent left pleural effusion and left basilar consolidation is seen. Small right pleural effusion is noted. Left jugular central line is noted with the tip at the cavoatrial junction. IMPRESSION: Stable appearance of the chest when compared with the prior exam. Previously seen lucency under the right hemidiaphragm is not appreciated on today's exam. Electronically Signed   By: Inez Catalina M.D.   On: 07/11/2019 09:12     Medications:     Scheduled Medications: . celecoxib  200 mg Per Tube BID  . chlorhexidine gluconate (MEDLINE KIT)  15 mL Mouth Rinse BID  . Chlorhexidine Gluconate Cloth  6 each Topical Daily  . docusate sodium  100 mg Oral Daily  . feeding supplement (ENSURE ENLIVE)  237 mL Oral TID WC  . furosemide  20 mg Intravenous Once  . levalbuterol  0.63 mg Nebulization TID  . mouth rinse  15 mL Mouth Rinse BID  . mometasone-formoterol  2 puff Inhalation BID  . montelukast  10 mg Per Tube Daily  . pantoprazole   40 mg Oral Daily  . sildenafil  20 mg Oral TID    Infusions: . sodium chloride Stopped (07/08/19 0448)  . sodium chloride    . amiodarone 30 mg/hr (07/12/19 0700)  . heparin 1,100 Units/hr (07/12/19 0907)  . norepinephrine (LEVOPHED) Adult infusion Stopped (07/11/19 0802)  . 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 Wound vac in place - GS with +++GPC. Culture NGTD - on Vancomycin.  - TCTS managing. S/p  wound vac change 2/14 . Will likely need omental flap  2. PAH - very mild by cath PVR < 1.0 - Stop sildenafil with systolic BP in 35H. Restart once improved.   3. PAF - In A fib.  - Continue amio 30 mg per hour + heparin drip.   4. Volume overload -LVEF normal RV normal.  - Continue ted hose. Weight remains up. SBP soft.  Last dose of IV lasix was on 2/13.  - SBP low.   5. Hypokalemia/Hypomagnesemia K stable.   6. Severe protein calorie malnutrition - will get Nutrition consult  7. Anemia  Hgb trending down. Follow daily CBC.   Length of Stay: [place] NP-C  07/12/2019, 10:07 AM  Advanced Heart Failure Team Pager [phone] (M-F; 7a - 4p)  Please contact [facility] for night-coverage after hours (4p -7a ) and weekends on [email or web address]  Patient seen and examined with the above-signed Advanced Practice Provider and/or Housestaff. I personally reviewed laboratory data, imaging studies and relevant notes. I independently examined the patient and formulated the important aspects of the plan. I have edited the note to reflect any of my changes or salient points. I have personally discussed the plan with the patient and/or family.  Wound vac and CT remain in place. On vancomycin. Will likely need omental flap. Dr. Marla Roe on board.   Back in AF with soft BPs. Continue amio gtt. Will stop  sildenafil for now. On heparin. No overt bleeding.   Glori Bickers, MD  3:53 PM

## 2019-07-12 NOTE — Progress Notes (Addendum)
[place] for heparin Indication: atrial fibrillation  Allergies  Allergen Reactions  . Codeine Nausea And Vomiting  . Gluten Meal Diarrhea    Abdominal pain and bloating  . Lactose Diarrhea    Bloating and abdominal pain  . Neosporin [Neomycin-Bacitracin Zn-Polymyx]   . Other     Pain medication pt. Cannot remember the name.    Patient Measurements: Height: 5' (152.4 cm) Weight: 170 lb 13.7 oz (77.5 kg) IBW/kg (Calculated) : 45.5 Heparin Dosing Weight: 61kg  Vital Signs: Temp: 97.8 F (36.6 C) (02/15 0744) Temp Source: Oral (02/15 0744) BP: 85/70 (02/15 0700) Pulse Rate: 112 (02/15 0700)  Labs: Recent Labs    07/10/19 0430 07/10/19 1813 07/11/19 0338 07/11/19 0338 07/11/19 1623 07/11/19 1714 07/12/19 0501  HGB   < >  --  8.6*   < > 8.6*  --  8.1*  HCT   < >  --  26.6*  --  27.0*  --  25.0*  PLT   < >  --  204  --  197  --  223  HEPARINUNFRC  --  0.18* 0.30  --   --   --  0.46  CREATININE   < >  --  0.71  --   --  0.58 0.60   < > = values in this interval not displayed.    Estimated Creatinine Clearance: 55.1 mL/min (by C-G formula based on SCr of 0.6 mg/dL).  Assessment: 79 year old female s/p thoracotomy and drainage of empyema - VAC changed today.  Currently on heparin for in/out AFib.   Heparin level came back near higher end of goal at 0.46, on 1150 units/hr. Hgb 8.1, plt 223. No s/sx of bleeding or infusion issues.    Goal of Therapy:  Heparin level 0.3-0.5 units/ml Aim for ~0.3 Monitor platelets by anticoagulation protocol: Yes   Plan:  -Decrease heparin infusion to 1100 units/h -Check heparin level in 6 hours  -Monitor HL, CBC, and for s/sx of bleeding    Antonietta Jewel, PharmD, BCCCP Clinical Pharmacist  Phone: [phone]  Please check AMION for all [place] phone numbers After 10:00 PM, call [place] [phone] 07/12/2019 8:05 AM   ADDENDUM Heparin level remains therapeutic at 0.32, on 1100  units/hr. No s/sx of bleeding or infusion issues. Continue at same rate and monitor AM heparin level.  Antonietta Jewel, PharmD, [place] Clinical Pharmacist

## 2019-07-12 NOTE — Plan of Care (Signed)

## 2019-07-12 NOTE — Progress Notes (Signed)
  Speech Language Pathology Treatment: Dysphagia  Patient Details Name: Connie Jarvis MRN: [id number] DOB: Jan 30, 1941 Today's Date: 07/12/2019 Time: 6759-1638 SLP Time Calculation (min) (ACUTE ONLY): 9 min  Assessment / Plan / Recommendation Clinical Impression  F/u after initial swallow evaluation on  2/13. Pt with no further concerns for dysphagia or aspiration.  Voice is strong/clear, good quality.  There is adequate mastication, brisk swallow with successive boluses thin liquid, no s/s of aspiration; good swallow/respiratory synchrony.  Continue regular solids, thin liquids.  SLP services to sign off.    HPI HPI: 79 yo female adm to [facility] with empyema, fungating mass s/p surgical intervention on 2/12 with drain placement, thoractomy.  Pt also for OR 2/14.  She has h/o HTN, pulmonary HTN, COPD, GERD and takes Prevaid for reflux at home.  Per chart, pt intubated 2/11 ? twice per chart review *at 1500 and 1517* .  Extubated 2/12 at 1155 am.  Pt denies h/o dysphagia except to large pills that she positions adequately to clear.  Her CXR showed possible pneumonperitoneum therefore SLP spoke to RN re: swallow eval.  Proceeding with eval with minimal intake approved.      SLP Plan  All goals met       Recommendations  Diet recommendations: Regular;Thin liquid Liquids provided via: Cup;Straw Medication Administration: Whole meds with liquid Supervision: Patient able to self feed                Oral Care Recommendations: Oral care BID Follow up Recommendations: None SLP Visit Diagnosis: Dysphagia, unspecified (R13.10) Plan: All goals met       GO              Connie Jarvis L. Tivis Ringer, [place] Office number [phone] Pager [phone]   Connie Jarvis 07/12/2019, 8:35 AM

## 2019-07-12 NOTE — Progress Notes (Signed)
Transferred via bed to [place], vitals stable, NAD noted. Alert and oriented x4, sitting up in bed talking

## 2019-07-12 NOTE — Discharge Summary (Signed)
Physician Discharge Summary  Patient ID: Connie Jarvis MRN: [id number] DOB/AGE: [birth year] 77 y.o.  Admit date: 07/07/2019 Discharge date: 07/24/2019  Admission Diagnoses:    Empyema ([place])   Pulmonary hypertension ([place])   COPD with hypoxia ([place])   Atrial fibrillation, chronic (HCC)   GERD (gastroesophageal reflux disease)   Essential hypertension  Discharge Diagnoses:     Empyema ([place])   Pulmonary hypertension (HCC)   COPD with hypoxia (HCC)   Atrial fibrillation, chronic (HCC)   GERD (gastroesophageal reflux disease)   Essential hypertension   Discharged Condition: good  History of Present Illness:      79 year old lady with known history of pulmonary hypertension and valvular heart disease presents for evaluation of left chest empyema.  She has known of diagnosis of pulmonary hypertension for approximately 1 year.  She had pneumonia back in the fall.  Ultimately she recovered from that well.  Recently, she noticed redness and eruption of pus on the left inferior chest wall.  For this, she sought evaluation today.  Chest x-ray was notably abnormal which was followed by CT scan demonstrating left empyema with extension into the skin.  She denies fevers or worsening shortness of breath.  She is transferred to [facility] for thoracic surgery consultation.   Hospital Course:  After admission, Ms. Demoret was started on IV antibiotics. She remained stable. Due to her history of pulmonary hypertension,  Dr. Haroldine Laws was consulted for evaluation of her cardiac status prior to planned surgery requiring general anesthesia. The echocardiogram was reviewed and right and left heart catheterization was carried out on 07/08/19.  The echo demonstrated an EF of 60-65%, evidence of moderate pulmonary hypertension,  and no significant valvular abnormalities. Coronary angiography showed mild non-obstructive atherosclerotic disease. She was taken to the OR on 07/08/19 where left thoracotomy was  performed for drainage of the empyema and debridement of the chest wall abscess. A wound vac was applied. The operative wound culture grew Gram+ cocci strep intermedius. She has completed her course of penicillin.. IV vancomycin was continued. She was returned to the OR on 07/11/19 for further debridement, wound irrigation, and vac dressing change.  Plastic surgery was consulted for assistance with tissue closure of the chest wall defect.  She developed atrial fibrillation that was managed by the cardiology team. She was started on IV amiodarone resulting in acceptable rate control. On 2019-07-13  she was taken back to the operating room where she underwent a greater omental flap closure as described below. Since that time she has continued to be followed by the advanced heart failure team who is assisting with overall cardiology management including volume overload.. She is maintaining sinus rhythm.She is also on eliquis.We have been monitoring HCT which is slowly decreasing and needs continued monitoring She does have a moderate amount of malnutrition in the nutrition department has assisted with management.She has had trouble with nausea and vomiting but this has shown steady improvement over time.She is stable for CIR transfer and we will continue to keep drains in place and assist with management of them and monitor her incisions.  Consults: cardiology, plastic surgery  Significant Diagnostic Studies:   ECHOCARDIOGRAM REPORT       Patient Name:  Connie Jarvis Date of Exam: 07/08/2019  Medical Rec #: [id number]  Height:    60.0 in  Accession #:  [id number]  Weight:    157.8 lb  Date of Birth: 1941/03/22   BSA:     1.69 m  Patient Age:  79 years   BP:      109/59 mmHg  Patient Gender: F      HR:      82 bpm.  Exam Location: Inpatient   Procedure: 2D Echo   Indications:  CHF-Acute Diastolic [id number] / [id number]    History:    Patient has no prior  history of Echocardiogram  examinations.         Mitral Valve Disease; Risk Factors:Hypertension. GERD.  Empyema         with extension into the skin.    Sonographer:  Darlina Sicilian RDCS  Referring Phys: [place]    1. Left ventricular ejection fraction, by estimation, is 60 to 65%. The  left ventricle has normal function. The left ventrical has no regional  wall motion abnormalities. Left ventricular diastolic parameters are  consistent with Grade II diastolic  dysfunction (pseudonormalization).  2. Right ventricular systolic function is normal. The right ventricular  size is normal. There is moderately elevated pulmonary artery systolic  pressure. The estimated right ventricular systolic pressure is [id number] mmHg.  3. The mitral valve is normal in structure and function. Mild to moderate  mitral valve regurgitation. No evidence of mitral stenosis.  4. Tricuspid valve regurgitation is mild to moderate.  5. The aortic valve is grossly normal. Aortic valve regurgitation is  trivial . No aortic stenosis is present.   FINDINGS  Left Ventricle: Left ventricular ejection fraction, by estimation, is 60  to 65%. The left ventricle has normal function. The left ventricle has no  regional wall motion abnormalities. There is no left ventricular  hypertrophy. Left ventricular diastolic  parameters are consistent with Grade II diastolic dysfunction  (pseudonormalization).   Right Ventricle: The right ventricular size is normal. No increase in  right ventricular wall thickness. Right ventricular systolic function is  normal. There is moderately elevated pulmonary artery systolic pressure.  The tricuspid regurgitant velocity is  3.46 m/s, and with an assumed right atrial pressure of 8 mmHg, the  estimated right ventricular systolic pressure is [id number] mmHg.   Left Atrium: Left atrial size was normal in size.   Right Atrium: Right atrial  size was normal in size.   Pericardium: There is no evidence of pericardial effusion.   Mitral Valve: The mitral valve is normal in structure and function. Mild  to moderate mitral valve regurgitation. No evidence of mitral valve  stenosis.   Tricuspid Valve: The tricuspid valve is normal in structure. Tricuspid  valve regurgitation is mild to moderate.   Aortic Valve: The aortic valve is grossly normal.. There is mild  thickening and mild calcification of the aortic valve. Aortic valve  regurgitation is trivial. No aortic stenosis is present. There is mild  thickening of the aortic valve. There is mild  calcification of the aortic valve.   Pulmonic Valve: The pulmonic valve was grossly normal. Pulmonic valve  regurgitation is not visualized.   Aorta: The aortic root, ascending aorta and aortic arch are all  structurally normal, with no evidence of dilitation or obstruction.   IAS/Shunts: The atrial septum is grossly normal.     LEFT VENTRICLE  PLAX 2D  LVIDd:     4.60 cm Diastology  LVIDs:     3.20 cm LV e' lateral:  6.96 cm/s  LV PW:     0.90 cm LV E/e' lateral: 10.5  LV IVS:    1.00 cm LV e' medial:  5.00 cm/s  LVOT diam:  1.80 cm LV E/e' medial: 14.6  LV SV:     47.84 ml  LV SV Index:  31.87  LVOT Area:   2.54 cm     LEFT ATRIUM      Index    RIGHT ATRIUM      Index  LA diam:   4.40 cm 2.61 cm/m RA Area:   11.80 cm  LA Vol (A4C): 38.9 ml 23.05 ml/m RA Volume:  27.40 ml 16.24 ml/m  AORTIC VALVE  LVOT Vmax:  99.80 cm/s  LVOT Vmean: 63.400 cm/s  LVOT VTI:  0.188 m    AORTA  Ao Root diam: 2.60 cm   MITRAL VALVE            TRICUSPID VALVE  MV Area (PHT): 3.77 cm       TR Peak grad:  47.9 mmHg  MV Decel Time: 201 msec       TR Vmax:    346.00 cm/s  MR Peak grad: 70.2 mmHg  MR Mean grad: 45.0 mmHg       SHUNTS  MR Vmax:   419.00 cm/s      Systemic VTI:  0.19 m  MR Vmean:   321.0 cm/s      Systemic Diam: 1.80 cm  MV E velocity: 72.80 cm/s 103 cm/s  MV A velocity: 57.70 cm/s 70.3 cm/s  MV E/A ratio: 1.26    1.5   Mertie Moores MD  Electronically signed by Mertie Moores MD  Signature Date/Time: 07/08/2019/10:56:10 AM    RIGHT/LEFT HEART CATH AND CORONARY ANGIOGRAPHY  07/08/19   Conclusion Prox Cx to Mid Cx lesion is 20% stenosed. Findings:  Ao = 104/46 (72)  LV = 90/14  RA = 7  RV = 47/8  PA = 50/9 (21)  PCW = 19  Fick cardiac output/index = 8.8/5.2  PVR = < 1.0 WU  Ao sat = 100  PA sat = 74%, 75%  Assessment:  1. LVEF 60-65%  2. Minimal CAD  3. Very mild PAH  Plan/Discussion:  Ok to proceed with thoracotomy for very large left-sided empyema.  Glori Bickers, MD  3:22 PM         Surgeon Notes   07/11/2019 7:51 PM Op Note signed by Wonda Olds, MD    07/09/2019 8:08 AM Op Note signed by Wonda Olds, MD     Indications Empyema ([place]) [place] (ICD-10-CM)]  PAH (pulmonary artery hypertension) ([place]) [I27.21 (ICD-10-CM)]     Procedural Details Technical Details The risks and indication of the procedure were explained. Consent was signed and placed on the chart. An appropriate timeout was taken prior to the procedure.   The risks and indication of the procedure were explained. Consent was signed and placed on the chart. An appropriate timeout was taken prior to the procedure. The right neck was prepped and draped in the routine sterile fashion and anesthetized with 1% local lidocaine.   A 7 FR venous sheath was placed in the right internal jugular vein using a modified Seldinger technique and u/s guidance. A standard Swan-Ganz catheter was used for the procedure and was left in place.   After a normal Allen's test was confirmed, the right wrist was prepped and draped in the routine sterile fashion and anesthetized with 1% local lidocaine. A 5 FR arterial sheath was then placed in the right radial  artery using a modified Seldinger technique. 3mg  IV verapamil was given through the sheath. Systemic heparin was administered. Standard catheters including a  JL 3.5 and a JR 4 were used. All catheter exchanges were made over a wire.  The left neck was prepped and draped sterilely. Using u/s guidance and a modified Seldinger technique, an antimicrobial bonded/coated triple lumen catheter was placed in the left internal jugular vein.    Estimated blood loss <50 mL.   During this procedure no sedation was administered.     Medications (Filter: Administrations occurring from 07/08/19 1245 to 07/08/19 1426)  Continuous medications are totaled by the amount administered until 07/08/19 1426.  Heparin (Porcine) in NaCl 1000-0.9 UT/500ML-% SOLN (mL) Total volume: 500 mL   Date/Time   Rate/Dose/Volume Action  07/08/19 1253  500 mL Given  Heparin (Porcine) in NaCl 1000-0.9 UT/500ML-% SOLN (mL) Total volume: 500 mL   Date/Time   Rate/Dose/Volume Action  07/08/19 1254  500 mL Given  lidocaine (PF) (XYLOCAINE) 1 % injection (mL) Total volume: 12 mL   Date/Time   Rate/Dose/Volume Action  07/08/19 1326  5 mL Given  1335  2 mL Given  1354  5 mL Given  Radial Cocktail/Verapamil only (mL) Total volume: 10 mL   Date/Time   Rate/Dose/Volume Action  07/08/19 1340  10 mL Given  heparin injection (Units) Total dose: 3,500 Units   Date/Time   Rate/Dose/Volume Action  07/08/19 1344  3,500 Units Given  iohexol (OMNIPAQUE) 350 MG/ML injection (mL) Total volume: 55 mL   Date/Time   Rate/Dose/Volume Action  07/08/19 1405  55 mL Given  ketorolac (TORADOL) 15 MG/ML injection 15 mg (mg) Total dose: Cannot be calculated*   *Administration dose not documented  Date/Time   Rate/Dose/Volume Action  07/08/19 1245  *Not included in total MAR Hold  mometasone-formoterol (DULERA) 100-5 MCG/ACT inhaler 2 puff (puff) Total dose: Cannot be calculated*   *Administration dose not documented    Date/Time   Rate/Dose/Volume Action  07/08/19 1245  *Not included in total MAR Hold  sodium chloride flush (NS) 0.9 % injection 10-40 mL (mL) Total dose: Cannot be calculated* Dosing weight: 71.6   *Administration dose not documented  Date/Time   Rate/Dose/Volume Action  07/08/19 1245  *Not included in total MAR Hold  vancomycin (VANCOCIN) IVPB 1000 mg/200 mL premix (mL/hr) Total dose: Cannot be calculated* Dosing weight: 71.6   *Administration dose not documented  Date/Time   Rate/Dose/Volume Action  07/08/19 1245  *Not included in total MAR Hold  piperacillin-tazobactam (ZOSYN) IVPB 3.375 g (mL/hr) Total dose: Cannot be calculated* Dosing weight: 71.6   *Administration dose not documented  Date/Time   Rate/Dose/Volume Action  07/08/19 1245  *Not included in total MAR Hold  0.9 % irrigation (POUR BTL) (mL) Total volume: 2,000 mL   Date/Time   Rate/Dose/Volume Action  07/08/19 1424  2,000 mL Given  acetaminophen (TYLENOL) tablet 500 mg (mg) Total dose: Cannot be calculated*   *Administration dose not documented  Date/Time   Rate/Dose/Volume Action  07/08/19 1245  *Not included in total MAR Hold  celecoxib (CELEBREX) capsule 200 mg (mg) Total dose: Cannot be calculated*   *Administration dose not documented  Date/Time   Rate/Dose/Volume Action  07/08/19 1245  *Not included in total MAR Hold  docusate sodium (COLACE) capsule 100 mg (mg) Total dose: Cannot be calculated*   *Administration dose not documented  Date/Time   Rate/Dose/Volume Action  07/08/19 1245  *Not included in total MAR Hold  enoxaparin (LOVENOX) injection 40 mg (mg) Total dose: Cannot be calculated*   *Administration dose not documented  Date/Time   Rate/Dose/Volume Action  07/08/19 1245  *Not  included in total MAR Hold  losartan (COZAAR) tablet 25 mg (mg) Total dose: Cannot be calculated*   *Administration dose not documented  Date/Time   Rate/Dose/Volume Action  07/08/19 1245  *Not  included in total MAR Hold  metoprolol succinate (TOPROL-XL) 24 hr tablet 12.5 mg (mg) Total dose: Cannot be calculated*   *Administration dose not documented  Date/Time   Rate/Dose/Volume Action  07/08/19 1245  *Not included in total MAR Hold  montelukast (SINGULAIR) tablet 10 mg (mg) Total dose: Cannot be calculated*   *Administration dose not documented  Date/Time   Rate/Dose/Volume Action  07/08/19 1245  *Not included in total MAR Hold  pantoprazole (PROTONIX) EC tablet 40 mg (mg) Total dose: Cannot be calculated*   *Administration dose not documented  Date/Time   Rate/Dose/Volume Action  07/08/19 1245  *Not included in total MAR Hold  traZODone (DESYREL) tablet 25 mg (mg) Total dose: Cannot be calculated*   *Administration dose not documented  Date/Time   Rate/Dose/Volume Action  07/08/19 1245  *Not included in total MAR Hold           Contrast Medication Name Total Dose  iohexol (OMNIPAQUE) 350 MG/ML injection 55 mL     Radiation/Fluoro Fluoro time: 6.5 (min)  DAP: [id number] (mGycm2)  Cumulative Air Kerma: 549 (mGy)        Coronary Findings Diagnostic Dominance: Right  Left Circumflex  Prox Cx to Mid Cx lesion 20% stenosed  Prox Cx to Mid Cx lesion is 20% stenosed.  Intervention No interventions have been documented.                      Wall Motion Resting    EF 60-65%            Coronary Diagrams Diagnostic Dominance: Right    Treatments:   Operative Note: 07/08/19 Procedure(s): THORACOTOMY MAJOR EMPYEMA DRAINAGE APPLICATION OF WOUND VAC Procedure Note  Connie Jarvis female 79 y.o. 07/09/2019  Procedure(s) and Anesthesia Type:    * THORACOTOMY MAJOR - General    * EMPYEMA DRAINAGE - General    * APPLICATION OF WOUND VAC - General  Surgeon(s) and Role:    * Wonda Olds, MD - Primary   Indications: The patient was admitted to the hospital with a brief history of left flank abscess. This was found to be  associated with left empyema. She has been sufficiently evaluated as an acceptable surgical candidate with low risk for perioperative events based on cardiac workup. She is taken for left thoracotomy and empyema evacuation     Surgeon: Wonda Olds   Assistants: Leretha Pol PA-C  Anesthesia: General endotracheal anesthesia  ASA Class: 5   Operative Note: 07/11/19  Procedure(s): WOUND VAC CHANGE AND WOUND IRRIGATION AND SHARP DEBRIDEMENT Procedure Note  Connie Jarvis female 78 y.o. 07/11/2019  Procedure(s) and Anesthesia Type:    * WOUND VAC CHANGE AND WOUND IRRIGATION AND SHARP DEBRIDEMENT - General  Surgeon(s) and Role:    Wonda Olds, MD - Primary    * Ivin Poot, MD - Assisting   Indications: The patient is s/p left thoracotomy for drainage of left empyema. The wound was left open due to frankly infected soft tissue now treated with wound vac. Taken to OR for wound vac change, wound debridement.      Surgeon: Wonda Olds   Assistants: Ivin Poot, MD  Anesthesia: General endotracheal anesthesia  ASA Class: 3  Procedure(s): GREATER OMENTAL FLAP  CLOSURE CLOSURE OF THORACOTOMY APPLICATION OF WOUND VAC USING PREVENA DRESSING Right Chest Tube Insertion Vascular Assessment With Spy Elite Procedure Note  Connie Jarvis female 79 y.o. 07/13/2019  Procedure(s) and Anesthesia Type:    * GREATER OMENTAL FLAP CLOSURE - General    * CLOSURE OF THORACOTOMY - General    * APPLICATION OF WOUND VAC USING PREVENA DRESSING - General    * Chest Tube Insertion    * Vascular Assessment With Spy Elite  Surgeon(s) and Role:    Wonda Olds, MD - Primary    Discharge Exam: Blood pressure (!) 111/59, pulse 74, temperature 97.6 F (36.4 C), temperature source Oral, resp. rate 18, height 5' (1.524 m), weight 61 kg, SpO2 100 %.  General appearance: alert, cooperative and no distress Heart: regular rate and rhythm and + systolic  murmur Lungs: clear Abdomen: doft Extremities: + edema, no calf tenderness Wound: minor erethema at staples *soft Disposition: Discharge disposition: [place] Not Defined       Discharge Instructions    Discharge patient   Complete by: As directed    [facility] ([facility]   Discharge disposition: [place] Not Defined   Discharge patient date: 07/24/2019     Allergies as of 07/24/2019      Reactions   Codeine Nausea And Vomiting   Gluten Meal Diarrhea   Abdominal pain and bloating   Lactose Diarrhea   Bloating and abdominal pain   Neosporin [neomycin-bacitracin Zn-polymyx]    Other    Pain medication pt. Cannot remember the name.      Medication List    STOP taking these medications   albuterol 108 (90 Base) MCG/ACT inhaler Commonly known as: VENTOLIN HFA   aspirin EC 81 MG tablet   b complex vitamins capsule   benzonatate 200 MG capsule Commonly known as: TESSALON   budesonide-formoterol 80-4.5 MCG/ACT inhaler Commonly known as: SYMBICORT Replaced by: mometasone-formoterol 100-5 MCG/ACT Aero   Caltrate 600+D 600-400 MG-UNIT tablet Generic drug: Calcium Carbonate-Vitamin D   clindamycin 300 MG capsule Commonly known as: CLEOCIN   Fish Oil 1000 MG Caps   Glucosamine-Chondroitin Max St Caps   losartan 25 MG tablet Commonly known as: COZAAR   metoprolol succinate 25 MG 24 hr tablet Commonly known as: TOPROL-XL   omeprazole 20 MG capsule Commonly known as: PRILOSEC   Opsumit 10 MG tablet Generic drug: macitentan   sildenafil 20 MG tablet Commonly known as: REVATIO   valACYclovir 1000 MG tablet Commonly known as: VALTREX   Vitamin D3 250 MCG (10000 UT) Tabs     TAKE these medications   acetaminophen 500 MG tablet Commonly known as: TYLENOL Take 500 mg by mouth every 6 (six) hours as needed for mild pain, fever or headache.   amiodarone 200 MG tablet Commonly known as:  PACERONE Take 1 tablet (200 mg total) by mouth 2 (two) times daily.   apixaban 5 MG Tabs tablet Commonly known as: ELIQUIS Take 1 tablet (5 mg total) by mouth 2 (two) times daily.   bisacodyl 5 MG EC tablet Commonly known as: DULCOLAX Take 2 tablets (10 mg total) by mouth daily as needed for moderate constipation.   celecoxib 200 MG capsule Commonly known as: CELEBREX Take 1 capsule (200 mg total) by mouth 2 (two) times daily.   colchicine 0.6 MG tablet Take 0.5 tablets (0.3 mg total) by mouth daily.   feeding supplement (ENSURE ENLIVE) Liqd Take 237 mLs by  mouth 3 (three) times daily between meals.   furosemide 20 MG tablet Commonly known as: LASIX Take 1 tablet (20 mg total) by mouth daily.   Gerhardt's butt cream Crea Apply 1 application topically 2 (two) times daily.   levalbuterol 0.63 MG/3ML nebulizer solution Commonly known as: XOPENEX Take 3 mLs (0.63 mg total) by nebulization every 6 (six) hours as needed for wheezing or shortness of breath.   loperamide HCl 1 MG/7.5ML suspension Commonly known as: IMODIUM Take 7.5 mLs (1 mg total) by mouth as needed for diarrhea or loose stools.   mometasone-formoterol 100-5 MCG/ACT Aero Commonly known as: DULERA Inhale 2 puffs into the lungs 2 (two) times daily. Replaces: budesonide-formoterol 80-4.5 MCG/ACT inhaler   montelukast 10 MG tablet Commonly known as: SINGULAIR Place 1 tablet (10 mg total) into feeding tube daily. What changed: how to take this   multivitamin with minerals Tabs tablet Take 1 tablet by mouth daily.   ondansetron 4 MG/2ML Soln injection Commonly known as: ZOFRAN Inject 2 mLs (4 mg total) into the vein every 6 (six) hours as needed for nausea or vomiting.   pantoprazole 40 MG tablet Commonly known as: PROTONIX Take 1 tablet (40 mg total) by mouth daily.   promethazine 25 MG/ML injection Commonly known as: PHENERGAN Inject 0.25 mLs (6.25 mg total) into the vein every 8 (eight) hours as  needed for nausea or vomiting.   traZODone 50 MG tablet Commonly known as: DESYREL Take 0.5 tablets (25 mg total) by mouth at bedtime as needed for sleep.        Signed: John Giovanni, PA-C 07/24/2019, 9:41 AM

## 2019-07-12 NOTE — Progress Notes (Signed)
Report called to 2C 

## 2019-07-12 NOTE — Anesthesia Preprocedure Evaluation (Addendum)
Anesthesia Evaluation  Patient identified by MRN, date of birth, ID band Patient awake    Reviewed: Allergy & Precautions, H&P , NPO status , Patient's Chart, lab work & pertinent test results, reviewed documented beta blocker date and time   Airway Mallampati: II  TM Distance: >3 FB Neck ROM: Full    Dental no notable dental hx. (+) Teeth Intact, Dental Advisory Given   Pulmonary COPD,  oxygen dependent,    Pulmonary exam normal breath sounds clear to auscultation       Cardiovascular Exercise Tolerance: Good hypertension, Pt. on medications and Pt. on home beta blockers + dysrhythmias Atrial Fibrillation  Rhythm:Irregular Rate:Normal     Neuro/Psych Anxiety Depression negative neurological ROS     GI/Hepatic Neg liver ROS, GERD  ,  Endo/Other  negative endocrine ROS  Renal/GU negative Renal ROS  negative genitourinary   Musculoskeletal  (+) Arthritis ,   Abdominal   Peds  Hematology  (+) Blood dyscrasia, anemia ,   Anesthesia Other Findings   Reproductive/Obstetrics negative OB ROS                            Anesthesia Physical Anesthesia Plan  ASA: III  Anesthesia Plan: General   Post-op Pain Management:    Induction: Intravenous  PONV Risk Score and Plan: 4 or greater and Ondansetron, Dexamethasone and Midazolam  Airway Management Planned: Oral ETT  Additional Equipment: Arterial line  Intra-op Plan:   Post-operative Plan: Possible Post-op intubation/ventilation  Informed Consent: I have reviewed the patients History and Physical, chart, labs and discussed the procedure including the risks, benefits and alternatives for the proposed anesthesia with the patient or authorized representative who has indicated his/her understanding and acceptance.     Dental advisory given  Plan Discussed with: CRNA  Anesthesia Plan Comments:         Anesthesia Quick  Evaluation

## 2019-07-12 NOTE — Progress Notes (Signed)
Physical Therapy Treatment Patient Details Name: Connie Jarvis MRN: [id number] DOB: Nov 16, 1940 Today's Date: 07/12/2019    History of Present Illness 79 y.o. female with medical history significant of O2 dependent COPD. Recently seen by GS for left upper chest wall lesion. S/p I&D and placed on Abx. Patient was to have further debridement of lesion but c/o SOB.  Seen in f/u by pulmonology. Patient found to have a large fungating mass, 8x20cm draining yellow fluid. CT scan of the chest revealed a large empyema measuring 13.4x12.5 cm with compressive atelectasis. Pt underwent thoracotomy with empyema drainage on 2/11.    PT Comments    Patient progressing with mobility reports much easier time moving herself in bed and getting to stand to get to chair.  Feel she remains appropriate for SNF level rehab at d/c.  If progresses, however, spouse may decide to take pt home.  IF home will need HHPT/aide, etc.  Will continue to follow acutely.    Follow Up Recommendations  SNF;Supervision/Assistance - 24 hour     Equipment Recommendations  Other (comment)(TBA)    Recommendations for Other Services       Precautions / Restrictions Precautions Precautions: Fall;Other (comment) Precaution Comments: wound vac L thorax, chest tube    Mobility  Bed Mobility Overal bed mobility: Needs Assistance Bed Mobility: Supine to Sit     Supine to sit: Min assist     General bed mobility comments: assist for lifting trunk, pt scooted hips and moved legs off bed  Transfers Overall transfer level: Needs assistance Equipment used: Rolling walker (2 wheeled) Transfers: Sit to/from [facility] Sit to Stand: Min assist;+2 safety/equipment Stand pivot transfers: Min assist;+2 safety/equipment       General transfer comment: assist for balance up to RW, then stand step to recliner with A for balance, lines  Ambulation/Gait                 Stairs             Wheelchair  Mobility    Modified Rankin (Stroke Patients Only)       Balance Overall balance assessment: Needs assistance Sitting-balance support: Feet supported Sitting balance-Leahy Scale: Good Sitting balance - Comments: at EOB   Standing balance support: Bilateral upper extremity supported Standing balance-Leahy Scale: Poor Standing balance comment: min a with BUE support of PT                            Cognition Arousal/Alertness: Awake/alert Behavior During Therapy: WFL for tasks assessed/performed Overall Cognitive Status: Within Functional Limits for tasks assessed                                        Exercises      General Comments General comments (skin integrity, edema, etc.): spouse in the room, HR 110's throughout and noted SpO2 on 2L Connie Jarvis WFL      Pertinent Vitals/Pain Pain Assessment: Faces Faces Pain Scale: Hurts little more Pain Location: L thorax Pain Descriptors / Indicators: Grimacing Pain Intervention(s): Monitored during session;Repositioned    Home Living                      Prior Function            PT Goals (current goals can now be found in the care plan  section) Progress towards PT goals: Progressing toward goals    Frequency    Min 3X/week      PT Plan Current plan remains appropriate    Co-evaluation              AM-PAC PT "6 Clicks" Mobility   Outcome Measure  Help needed turning from your back to your side while in a flat bed without using bedrails?: A Little Help needed moving from lying on your back to sitting on the side of a flat bed without using bedrails?: A Little Help needed moving to and from a bed to a chair (including a wheelchair)?: A Little Help needed standing up from a chair using your arms (e.g., wheelchair or bedside chair)?: A Little Help needed to walk in hospital room?: A Lot Help needed climbing 3-5 steps with a railing? : Total 6 Click Score: 15    End of  Session Equipment Utilized During Treatment: Oxygen;Other (comment)(wound vac & chest tube)   Patient left: in chair;with call bell/phone within reach;with family/visitor present Nurse Communication: Mobility status PT Visit Diagnosis: Unsteadiness on feet (R26.81);Muscle weakness (generalized) (M62.81)     Time: 1231-1300 PT Time Calculation (min) (ACUTE ONLY): 29 min  Charges:  $Therapeutic Activity: 23-37 mins                     Magda Kiel, [place] [facility] [phone] 07/12/2019    Reginia Naas 07/12/2019, 5:18 PM

## 2019-07-12 NOTE — Progress Notes (Signed)
Transferred -in from [place] by bed awake and alert. Wound vac in placed , canister is full, new  canister applied.Dressing with old drainage.

## 2019-07-12 NOTE — Discharge Instructions (Signed)

## 2019-07-12 NOTE — Progress Notes (Signed)
Blood  pressures been [id number] to [id number] systolic , asymptomatic, PA made aware with no order , continue to monitor.

## 2019-07-12 NOTE — Progress Notes (Signed)
1 Day Post-Op Procedure(s) (LRB): WOUND VAC CHANGE AND WOUND IRRIGATION AND SHARP DEBRIDEMENT (Left) Subjective: No complaints  Objective: Vital signs in last 24 hours: Temp:  [97.4 F (36.3 C)-98.3 F (36.8 C)] 97.8 F (36.6 C) (02/15 0744) Pulse Rate:  [32-144] 112 (02/15 0700) Cardiac Rhythm: Atrial fibrillation (02/15 0400) Resp:  [18] 18 (02/14 2008) BP: (85-131)/(52-98) 85/70 (02/15 0700) SpO2:  [91 %-100 %] 100 % (02/15 0700) Weight:  [77.5 kg] 77.5 kg (02/15 0400)  Hemodynamic parameters for last 24 hours:    Intake/Output from previous day: 02/14 0701 - 02/15 0700 In: 1658.9 [P.O.:600; I.V.:608.8; IV Piggyback:450.1] Out: 920 [Urine:650; Drains:250; Blood:20] Intake/Output this shift: No intake/output data recorded.  General appearance: alert and cooperative Neurologic: intact Heart: irregularly irregular rhythm Lungs: diminished breath sounds LLL Abdomen: soft, non-tender; bowel sounds normal; no masses,  no organomegaly Extremities: edema 2+ Wound: dressing dry  Lab Results: Recent Labs    07/11/19 1623 07/12/19 0501  WBC 9.8 8.5  HGB 8.6* 8.1*  HCT 27.0* 25.0*  PLT 197 223   BMET:  Recent Labs    07/11/19 1714 07/12/19 0501  NA 137 139  K 3.8 4.1  CL 104 102  CO2 27 28  GLUCOSE 170* 113*  BUN 8 7*  CREATININE 0.58 0.60  CALCIUM 7.3* 7.5*    PT/INR: No results for input(s): LABPROT, INR in the last 72 hours. ABG    Component Value Date/Time   PHART 7.391 07/09/2019 0427   HCO3 28.5 (H) 07/09/2019 0427   TCO2 30 07/09/2019 0427   O2SAT 99.0 07/09/2019 0427   CBG (last 3)  No results for input(s): GLUCAP in the last 72 hours.  Assessment/Plan: S/P Procedure(s) (LRB): WOUND VAC CHANGE AND WOUND IRRIGATION AND SHARP DEBRIDEMENT (Left) pre-op wound closure. Plan abd/pelvis CT to r/o intra-abdominal masses or other pathology   Which might complicate omental flap repair of thoracic defect   LOS: 5 days    Wonda Olds 07/12/2019

## 2019-07-12 NOTE — Consult Note (Signed)
Reason for Consult: Chest Wound Referring Physician: Dr. Tharon Aquas Trigt  Connie Jarvis is an 79 y.o. female.  HPI: The patient is a 79 yrs old wf here for treatment of a left chest wall wound.  The patient was admitted on 2/10 with a draining left anterior lateral chest wound.  The history indicates she is oxygen dependent for her COPD.  According to the patient, she was seen by a general surgeon in [place] within the last year.  She had pneumonia / bronchitis and then recently noted drainage from the left chest.  She was found to have a large fungating empyema ~ 13 x 12 cm. She has been on antibiotic coverage.  She was admitted and underwent surgical debridement by Dr. Orvan Seen 2/11 and 2/14 for thoracotomy, drainage of the empyema, debridement and placement of the Vac.  She is stable, on heparin and seems well aware of her condition and history.  The drainage from the [facility] is serosanginous.   Past Medical History:  Diagnosis Date  . Allergy   . Anemia   . Anxiety   . Arthritis   . Cataract    pt. not sure which eye early stage  . Depression   . GERD (gastroesophageal reflux disease)   . Hypertension     Past Surgical History:  Procedure Laterality Date  . APPLICATION OF WOUND VAC Left 07/08/2019   Procedure: APPLICATION OF WOUND VAC;  Surgeon: Wonda Olds, MD;  Location: [facility] OR;  Service: Thoracic;  Laterality: Left;  . CENTRAL LINE INSERTION Left 07/08/2019   Procedure: CENTRAL LINE INSERTION;  Surgeon: Jolaine Artist, MD;  Location: [facility];  Service: Cardiovascular;  Laterality: Left;  . COLONOSCOPY    . EMPYEMA DRAINAGE Left 07/08/2019   Procedure: EMPYEMA DRAINAGE;  Surgeon: Wonda Olds, MD;  Location: [facility] OR;  Service: Thoracic;  Laterality: Left;  . MOUTH SURGERY    . RIGHT/LEFT HEART CATH AND CORONARY ANGIOGRAPHY N/A 07/08/2019   Procedure: RIGHT/LEFT HEART CATH AND CORONARY ANGIOGRAPHY;  Surgeon: Jolaine Artist, MD;  Location: [facility];   Service: Cardiovascular;  Laterality: N/A;  . THORACOTOMY Left 07/08/2019   Procedure: THORACOTOMY MAJOR;  Surgeon: Wonda Olds, MD;  Location: [facility] OR;  Service: Thoracic;  Laterality: Left;  . TONSILLECTOMY    . TUBAL LIGATION      Family History  Problem Relation Age of Onset  . Colon cancer Neg Hx   . Colon polyps Neg Hx   . Esophageal cancer Neg Hx   . Pancreatic cancer Neg Hx   . Rectal cancer Neg Hx   . Stomach cancer Neg Hx     Social History:  reports that she has never smoked. She has never used smokeless tobacco. She reports previous alcohol use. She reports that she does not use drugs.  Allergies:  Allergies  Allergen Reactions  . Codeine Nausea And Vomiting  . Gluten Meal Diarrhea    Abdominal pain and bloating  . Lactose Diarrhea    Bloating and abdominal pain  . Neosporin [Neomycin-Bacitracin Zn-Polymyx]   . Other     Pain medication pt. Cannot remember the name.    Medications: I have reviewed the patient's current medications.  Results for orders placed or performed during the hospital encounter of 07/07/19 (from the past 48 hour(s))  Heparin level (unfractionated)     Status: Abnormal   Collection Time: 07/10/19  6:13 PM  Result Value Ref Range  Heparin Unfractionated 0.18 (L) 0.30 - 0.70 IU/mL    Comment: (NOTE) If heparin results are below expected values, and patient dosage has  been confirmed, suggest follow up testing of antithrombin III levels. Performed at [facility], [place] [address], [place], [place] [address]   Heparin level (unfractionated)     Status: None   Collection Time: 07/11/19  3:38 AM  Result Value Ref Range   Heparin Unfractionated 0.30 0.30 - 0.70 IU/mL    Comment: (NOTE) If heparin results are below expected values, and patient dosage has  been confirmed, suggest follow up testing of antithrombin III levels. Performed at [facility], [place] [address], [place], [place] [address]   CBC     Status: Abnormal    Collection Time: 07/11/19  3:38 AM  Result Value Ref Range   WBC 10.5 4.0 - 10.5 K/uL   RBC 3.03 (L) 3.87 - 5.11 MIL/uL   Hemoglobin 8.6 (L) 12.0 - 15.0 g/dL   HCT 26.6 (L) 36.0 - 46.0 %   MCV 87.8 80.0 - 100.0 fL   MCH 28.4 26.0 - 34.0 pg   MCHC 32.3 30.0 - 36.0 g/dL   RDW 17.0 (H) 11.5 - 15.5 %   Platelets 204 150 - 400 K/uL   nRBC 0.0 0.0 - 0.2 %    Comment: Performed at [facility], [place] [address] [place], [place] [address]  Comprehensive metabolic panel     Status: Abnormal   Collection Time: 07/11/19  3:38 AM  Result Value Ref Range   Sodium 140 135 - 145 mmol/L   Potassium 3.4 (L) 3.5 - 5.1 mmol/L   Chloride 104 98 - 111 mmol/L   CO2 29 22 - 32 mmol/L   Glucose, Bld 131 (H) 70 - 99 mg/dL   BUN 8 8 - 23 mg/dL   Creatinine, Ser 0.71 0.44 - 1.00 mg/dL   Calcium 7.2 (L) 8.9 - 10.3 mg/dL   Total Protein 5.1 (L) 6.5 - 8.1 g/dL   Albumin 1.6 (L) 3.5 - 5.0 g/dL   AST 18 15 - 41 U/L   ALT 13 0 - 44 U/L   Alkaline Phosphatase 43 38 - 126 U/L   Total Bilirubin 0.7 0.3 - 1.2 mg/dL   GFR calc non Af Amer >60 >60 mL/min   GFR calc Af Amer >60 >60 mL/min   Anion gap 7 5 - 15    Comment: Performed at [facility], [place] [address], [place], [place] [address]  CBC     Status: Abnormal   Collection Time: 07/11/19  4:23 PM  Result Value Ref Range   WBC 9.8 4.0 - 10.5 K/uL   RBC 3.03 (L) 3.87 - 5.11 MIL/uL   Hemoglobin 8.6 (L) 12.0 - 15.0 g/dL   HCT 27.0 (L) 36.0 - 46.0 %   MCV 89.1 80.0 - 100.0 fL   MCH 28.4 26.0 - 34.0 pg   MCHC 31.9 30.0 - 36.0 g/dL   RDW 17.0 (H) 11.5 - 15.5 %   Platelets 197 150 - 400 K/uL   nRBC 0.0 0.0 - 0.2 %    Comment: Performed at [facility], [place] [address], [place], [place] [id number]  Basic metabolic panel     Status: Abnormal   Collection Time: 07/11/19  5:14 PM  Result Value Ref Range   Sodium 137 135 - 145 mmol/L   Potassium 3.8 3.5 - 5.1 mmol/L   Chloride 104 98 -  111 mmol/L   CO2 27 22 - 32 mmol/L   Glucose, Bld 170 (H)  70 - 99 mg/dL   BUN 8 8 - 23 mg/dL   Creatinine, Ser 0.58 0.44 - 1.00 mg/dL   Calcium 7.3 (L) 8.9 - 10.3 mg/dL   GFR calc non Af Amer >60 >60 mL/min   GFR calc Af Amer >60 >60 mL/min   Anion gap 6 5 - 15    Comment: Performed at [place] [address], [place], [place] [address]  Heparin level (unfractionated)     Status: None   Collection Time: 07/12/19  5:01 AM  Result Value Ref Range   Heparin Unfractionated 0.46 0.30 - 0.70 IU/mL    Comment: (NOTE) If heparin results are below expected values, and patient dosage has  been confirmed, suggest follow up testing of antithrombin III levels. Performed at [facility], [place] [address], [place], [place] [address]   CBC     Status: Abnormal   Collection Time: 07/12/19  5:01 AM  Result Value Ref Range   WBC 8.5 4.0 - 10.5 K/uL   RBC 2.80 (L) 3.87 - 5.11 MIL/uL   Hemoglobin 8.1 (L) 12.0 - 15.0 g/dL   HCT 25.0 (L) 36.0 - 46.0 %   MCV 89.3 80.0 - 100.0 fL   MCH 28.9 26.0 - 34.0 pg   MCHC 32.4 30.0 - 36.0 g/dL   RDW 17.2 (H) 11.5 - 15.5 %   Platelets 223 150 - 400 K/uL   nRBC 0.0 0.0 - 0.2 %    Comment: Performed at [facility], [place] [address] [place], [place] [address]  Comprehensive metabolic panel     Status: Abnormal   Collection Time: 07/12/19  5:01 AM  Result Value Ref Range   Sodium 139 135 - 145 mmol/L   Potassium 4.1 3.5 - 5.1 mmol/L   Chloride 102 98 - 111 mmol/L   CO2 28 22 - 32 mmol/L   Glucose, Bld 113 (H) 70 - 99 mg/dL   BUN 7 (L) 8 - 23 mg/dL   Creatinine, Ser 0.60 0.44 - 1.00 mg/dL   Calcium 7.5 (L) 8.9 - 10.3 mg/dL   Total Protein 5.0 (L) 6.5 - 8.1 g/dL   Albumin 1.8 (L) 3.5 - 5.0 g/dL   AST 18 15 - 41 U/L   ALT 13 0 - 44 U/L   Alkaline Phosphatase 47 38 - 126 U/L   Total Bilirubin 0.7 0.3 - 1.2 mg/dL   GFR calc non Af Amer >60 >60 mL/min   GFR calc Af Amer >60 >60 mL/min   Anion gap 9 5 - 15    Comment: Performed at [place] [address] [place], [place] [address]     CT ABDOMEN PELVIS WO CONTRAST  Result Date: 07/12/2019 CLINICAL DATA:  Preoperative evaluation for omental flap repair of thoracic defect. EXAM: CT ABDOMEN AND PELVIS WITHOUT CONTRAST TECHNIQUE: Multidetector CT imaging of the abdomen and pelvis was performed following the standard protocol without IV contrast. COMPARISON:  07/07/2019 FINDINGS: Lower chest: Bilateral lower lobe collapse/consolidation. Lower left thoracic wall wound noted with left chest tube in situ. Packing material and gas noted in the lower left pleural space. Hepatobiliary: No focal abnormality in the liver on this study without intravenous contrast. 11 mm calcified gallstone evident. No intrahepatic or extrahepatic biliary dilation. Pancreas: No focal mass lesion. No dilatation of the main duct. No intraparenchymal cyst. No peripancreatic edema. Spleen:  No splenomegaly. No focal mass lesion. Adrenals/Urinary Tract: No adrenal nodule or mass. No hydronephrosis in either kidney. No evidence for hydroureter. Bladder decompressed by Foley catheter. Stomach/Bowel: Stomach is unremarkable. No gastric wall thickening. No evidence of outlet obstruction. Duodenum is normally positioned as is the ligament of Treitz. No small bowel wall thickening. No small bowel dilatation. No colonic dilatation. Vascular/Lymphatic: There is abdominal aortic atherosclerosis without aneurysm. There is no gastrohepatic or hepatoduodenal ligament lymphadenopathy. No retroperitoneal or mesenteric lymphadenopathy. No pelvic sidewall lymphadenopathy. Reproductive: The uterus is unremarkable.  There is no adnexal mass. Other: Small volume free fluid noted adjacent to the liver and spleen. Small volume free fluid noted in the cul-de-sac. Musculoskeletal: Diffuse body wall edema. No worrisome lytic or sclerotic osseous abnormality. Mild anterolisthesis noted L4 on 5 and L5 on S1. IMPRESSION: 1. No evidence for abdominal wall or intra-abdominal mass lesion 2. Diffuse body  wall edema. 3. Left-sided open thoracic surgical wound status post left thoracotomy for empyema drainage. 4. Cholelithiasis. 5.  Aortic Atherosclerois (ICD10-170.0) Electronically Signed   By: Misty Stanley M.D.   On: 07/12/2019 09:23   DG Chest Port 1 View  Result Date: 07/12/2019 CLINICAL DATA:  Pneumothorax on left. Additional history provided: Chest tube present, pneumothorax, lung surgery EXAM: PORTABLE CHEST 1 VIEW COMPARISON:  Chest radiograph 07/11/2019 FINDINGS: Unchanged position of a left IJ approach central venous catheter with tip projecting in the region of the upper right atrium. Unchanged position of a left-sided chest tube. As before, the chest tube is kinked at the level of the thoracic wall. Overlying cardiac monitoring leads. The cardiomediastinal silhouette is unchanged. Aortic atherosclerosis. No visible pneumothorax. Persistent left pleural effusion with associated left basilar atelectasis and/or consolidation. Persistent trace right pleural effusion. IMPRESSION: No significant interval change as compared to chest radiograph 07/11/2019, as detailed. As before, the left-sided chest tube appears slightly kinked at the level of the chest wall. No visible pneumothorax. Redemonstrated left pleural effusion with left basilar atelectasis and/or consolidation. Persistent small right pleural effusion. Electronically Signed   By: Kellie Simmering DO   On: 07/12/2019 07:47   DG Chest Port 1 View  Result Date: 07/11/2019 CLINICAL DATA:  Follow-up chest tube EXAM: PORTABLE CHEST 1 VIEW COMPARISON:  07/10/2019 FINDINGS: Left thoracostomy catheter is again seen and stable. The catheter is kinked at the chest wall level. No pneumothorax is seen. Persistent left pleural effusion and left basilar consolidation is seen. Small right pleural effusion is noted. Left jugular central line is noted with the tip at the cavoatrial junction. IMPRESSION: Stable appearance of the chest when compared with the prior  exam. Previously seen lucency under the right hemidiaphragm is not appreciated on today's exam. Electronically Signed   By: Inez Catalina M.D.   On: 07/11/2019 09:12    Review of Systems  Constitutional: Positive for activity change.  Eyes: Negative.   Respiratory: Positive for chest tightness. Negative for shortness of breath.   Cardiovascular: Positive for leg swelling.  Gastrointestinal: Negative for abdominal pain.  Endocrine: Negative.   Genitourinary: Negative.   Neurological: Negative.   Hematological: Negative.   Psychiatric/Behavioral: Negative for agitation and behavioral problems.   Blood pressure 94/67, pulse 79, temperature 98.5 F (36.9 C), temperature source Oral, resp. rate (!) 23, height 5' (1.524 m), weight 77.5 kg, SpO2 100 %. Physical Exam  Constitutional: She is oriented to person, place, and time. She appears well-developed and well-nourished.  HENT:  Head: Normocephalic and atraumatic.  Eyes: Conjunctivae are normal.  Cardiovascular: Normal rate.  Respiratory: Effort normal. No respiratory distress.  GI: Soft. She exhibits no distension.  Musculoskeletal:        General: Edema present. No tenderness.  Neurological: She is alert and oriented to person, place, and time.  Skin: Skin is warm. There is erythema.  Psychiatric: She has a normal mood and affect. Her behavior is normal. Thought content normal.    Assessment/Plan: Left chest wall defect.   I have reviewed the CT scan.  Patient made me aware she is planing on surgery tomorrow with possible omental flap. She has several options for surgical treatment of the defect and muscle flap to fill space.  Latissimus may still be available depending on the amount that was debrided.  Minimal donor complications with this flap. A musculocutaneous flap would also be a nice option.   Vertical Rectus flap with skin, omental flap with continued VAC and possible direct closure may be possible.   I remain available as  needed.  [place] 07/12/2019, 2:50 PM  [phone] Office   The 21st Century Cures Act was signed into law in 2016 which includes the topic of electronic health records.  This provides immediate access to information in MyChart.  This includes consultation notes, operative notes, office notes, lab results and pathology reports.  If you have any questions about what you read please let us know at your next visit or call us at the office.  We are right here with you.

## 2019-07-12 NOTE — Progress Notes (Signed)
Chaplain engaged in initial visit with Connie Jarvis on [place].  Connie Jarvis is not interested in completing an Advanced Directive, but she is interested in completing or revising a will that she already has in place.  Chaplain will speak to nurse about inputting a consult to social work or case management regarding resources for a will.   Chaplain will follow-up as needed.

## 2019-07-12 NOTE — Progress Notes (Signed)
Nutrition Follow-up  DOCUMENTATION CODES:   Not applicable  INTERVENTION:   Continue Ensure Enlive po TID, each supplement provides 350 kcal and 20 grams of protein. Ensure Enlive is Lactose Free.    NUTRITION DIAGNOSIS:   Inadequate oral intake related to acute illness, poor appetite as evidenced by meal completion < 50%, per patient/family report.  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 PMH includes pulmonary HTN, COPD on home oxygen, GERD  RD working remotely.  2/11 L. Thoracotomy, wound vac placed 2/14 OR for wound vac change, wound debridement  Limited documentation of po intake; recorded po intake 25-40%  No weight loss per weight encounters; current wt 77.5 kg; admit weight 71.6 kg  Labs: reviewed Meds: reviewed   Diet Order:   Diet Order            Diet regular Room service appropriate? Yes; Fluid consistency: Thin  Diet effective now              EDUCATION NEEDS:   Not appropriate for education at this time  Skin:  Skin Assessment: Skin Integrity Issues: Skin Integrity Issues:: Wound VAC Wound Vac: Left sided open thoracic surgical wound post thoracotomy  Last BM:  2/14  Height:   Ht Readings from Last 1 Encounters:  07/07/19 5' (1.524 m)    Weight:   Wt Readings from Last 1 Encounters:  07/12/19 77.5 kg    BMI:  Body mass index is 33.37 kg/m.  Estimated Nutritional Needs:   Kcal:  1600-1800 kcals  Protein:  80-90 g  Fluid:  >/= 1.6 L   Kerman Passey MS, RDN, LDN, CNSC RD Pager Number and Weekend/On-Call After Hours Pager Located in [place]

## 2019-07-13 ENCOUNTER — Inpatient Hospital Stay (HOSPITAL_COMMUNITY): Payer: Medicare Other | Admitting: Anesthesiology

## 2019-07-13 ENCOUNTER — Inpatient Hospital Stay (HOSPITAL_COMMUNITY): Payer: Medicare Other

## 2019-07-13 ENCOUNTER — Encounter (HOSPITAL_COMMUNITY)
Admission: AD | Disposition: A | Discharge status: Rehabilitation Facility | Payer: Self-pay | Source: Other Acute Inpatient Hospital | Attending: Cardiothoracic Surgery

## 2019-07-13 DIAGNOSIS — J869 Pyothorax without fistula: Secondary | ICD-10-CM

## 2019-07-13 HISTORY — PX: CHEST TUBE INSERTION: SHX231

## 2019-07-13 HISTORY — PX: THORACOTOMY: SHX5074

## 2019-07-13 HISTORY — PX: VASCULAR ASSESSMENT WITH SPY ELITE: SHX6445

## 2019-07-13 HISTORY — PX: APPLICATION OF WOUND VAC: SHX5189

## 2019-07-13 HISTORY — PX: GREATER OMENTAL FLAP CLOSURE: SHX6319

## 2019-07-13 LAB — HEPARIN LEVEL (UNFRACTIONATED): Heparin Unfractionated: 0.42 IU/mL (ref 0.30–0.70)

## 2019-07-13 LAB — CBC
HCT: 22.3 % — ABNORMAL LOW (ref 36.0–46.0)
Hemoglobin: 6.9 g/dL — CL (ref 12.0–15.0)
MCH: 28.3 pg (ref 26.0–34.0)
MCHC: 30.9 g/dL (ref 30.0–36.0)
MCV: 91.4 fL (ref 80.0–100.0)
Platelets: 211 10*3/uL (ref 150–400)
RBC: 2.44 MIL/uL — ABNORMAL LOW (ref 3.87–5.11)
RDW: 17.7 % — ABNORMAL HIGH (ref 11.5–15.5)
WBC: 6 10*3/uL (ref 4.0–10.5)
nRBC: 0 % (ref 0.0–0.2)

## 2019-07-13 LAB — COMPREHENSIVE METABOLIC PANEL
ALT: 15 U/L (ref 0–44)
AST: 20 U/L (ref 15–41)
Albumin: 1.6 g/dL — ABNORMAL LOW (ref 3.5–5.0)
Alkaline Phosphatase: 45 U/L (ref 38–126)
Anion gap: 4 — ABNORMAL LOW (ref 5–15)
BUN: 7 mg/dL — ABNORMAL LOW (ref 8–23)
CO2: 31 mmol/L (ref 22–32)
Calcium: 7.4 mg/dL — ABNORMAL LOW (ref 8.9–10.3)
Chloride: 106 mmol/L (ref 98–111)
Creatinine, Ser: 0.61 mg/dL (ref 0.44–1.00)
GFR calc Af Amer: >60 mL/min (ref >60)
GFR calc non Af Amer: >60 mL/min (ref >60)
Glucose, Bld: 89 mg/dL (ref 70–99)
Potassium: 3.3 mmol/L — ABNORMAL LOW (ref 3.5–5.1)
Sodium: 141 mmol/L (ref 135–145)
Total Bilirubin: 0.6 mg/dL (ref 0.3–1.2)
Total Protein: 4.6 g/dL — ABNORMAL LOW (ref 6.5–8.1)

## 2019-07-13 LAB — PROTIME-INR
INR: 1.2 (ref 0.8–1.2)
Prothrombin Time: 15.6 seconds — ABNORMAL HIGH (ref 11.4–15.2)

## 2019-07-13 LAB — VANCOMYCIN, PEAK: Vancomycin Pk: 29 ug/mL — ABNORMAL LOW (ref 30–40)

## 2019-07-13 LAB — VANCOMYCIN, TROUGH: Vancomycin Tr: 15 ug/mL (ref 15–20)

## 2019-07-13 LAB — PREPARE RBC (CROSSMATCH)

## 2019-07-13 LAB — APTT: aPTT: 184 seconds (ref 24–36)

## 2019-07-13 SURGERY — GREATER OMENTAL FLAP CLOSURE
Anesthesia: General | Laterality: Right

## 2019-07-13 MED ORDER — LACTATED RINGERS IV SOLN: INTRAVENOUS | Status: DC | PRN

## 2019-07-13 MED ORDER — POTASSIUM CHLORIDE CRYS ER 20 MEQ PO TBCR
40.0000 meq | EXTENDED_RELEASE_TABLET | Freq: Three times a day (TID) | ORAL | Status: AC
  Administered 2019-07-13: 18:00:00 40 meq via ORAL
  Filled 2019-07-13: qty 2

## 2019-07-13 MED ORDER — ONDANSETRON HCL 4 MG/2ML IJ SOLN
4.0000 mg | Freq: Four times a day (QID) | INTRAMUSCULAR | Status: DC | PRN
  Administered 2019-07-17 – 2019-07-18 (×3): 4 mg via INTRAVENOUS
  Filled 2019-07-13 (×2): qty 2

## 2019-07-13 MED ORDER — LIDOCAINE 2% (20 MG/ML) 5 ML SYRINGE
INTRAMUSCULAR | Status: AC
  Filled 2019-07-13: qty 5

## 2019-07-13 MED ORDER — SUGAMMADEX SODIUM 200 MG/2ML IV SOLN
INTRAVENOUS | Status: DC | PRN
  Administered 2019-07-13: 200 mg via INTRAVENOUS

## 2019-07-13 MED ORDER — ACETAMINOPHEN 500 MG PO TABS
1000.0000 mg | ORAL_TABLET | Freq: Four times a day (QID) | ORAL | Status: AC
  Administered 2019-07-13 – 2019-07-18 (×13): 1000 mg via ORAL
  Filled 2019-07-13 (×15): qty 2

## 2019-07-13 MED ORDER — ROCURONIUM BROMIDE 10 MG/ML (PF) SYRINGE
PREFILLED_SYRINGE | INTRAVENOUS | Status: AC
  Filled 2019-07-13: qty 10

## 2019-07-13 MED ORDER — PROPOFOL 10 MG/ML IV BOLUS
INTRAVENOUS | Status: DC | PRN
  Administered 2019-07-13: 60 mg via INTRAVENOUS

## 2019-07-13 MED ORDER — FENTANYL CITRATE (PF) 100 MCG/2ML IJ SOLN
INTRAMUSCULAR | Status: AC
  Filled 2019-07-13: qty 2

## 2019-07-13 MED ORDER — PHENYLEPHRINE HCL-NACL 10-0.9 MG/250ML-% IV SOLN
INTRAVENOUS | Status: DC | PRN
  Administered 2019-07-13: 30 ug/min via INTRAVENOUS

## 2019-07-13 MED ORDER — 0.9 % SODIUM CHLORIDE (POUR BTL) OPTIME
TOPICAL | Status: DC | PRN
  Administered 2019-07-13 (×2): 1000 mL

## 2019-07-13 MED ORDER — FENTANYL CITRATE (PF) 250 MCG/5ML IJ SOLN
INTRAMUSCULAR | Status: AC
  Filled 2019-07-13: qty 5

## 2019-07-13 MED ORDER — INDOCYANINE GREEN 25 MG IV SOLR
1.2500 mg | Freq: Once | INTRAVENOUS | Status: DC
  Filled 2019-07-13: qty 10

## 2019-07-13 MED ORDER — TRAMADOL HCL 50 MG PO TABS
50.0000 mg | ORAL_TABLET | Freq: Four times a day (QID) | ORAL | Status: DC | PRN
  Administered 2019-07-14 – 2019-07-17 (×2): 50 mg via ORAL
  Filled 2019-07-13 (×2): qty 1

## 2019-07-13 MED ORDER — ONDANSETRON HCL 4 MG/2ML IJ SOLN
INTRAMUSCULAR | Status: DC | PRN
  Administered 2019-07-13: 4 mg via INTRAVENOUS

## 2019-07-13 MED ORDER — DEXAMETHASONE SODIUM PHOSPHATE 10 MG/ML IJ SOLN
INTRAMUSCULAR | Status: AC
  Filled 2019-07-13: qty 1

## 2019-07-13 MED ORDER — DEXAMETHASONE SODIUM PHOSPHATE 10 MG/ML IJ SOLN
INTRAMUSCULAR | Status: DC | PRN
  Administered 2019-07-13: 10 mg via INTRAVENOUS

## 2019-07-13 MED ORDER — FENTANYL CITRATE (PF) 100 MCG/2ML IJ SOLN
25.0000 ug | INTRAMUSCULAR | Status: DC | PRN
  Administered 2019-07-13 (×2): 25 ug via INTRAVENOUS

## 2019-07-13 MED ORDER — FENTANYL CITRATE (PF) 100 MCG/2ML IJ SOLN: 25.0000 ug | INTRAMUSCULAR | Status: DC | PRN

## 2019-07-13 MED ORDER — EPHEDRINE 5 MG/ML INJ
INTRAVENOUS | Status: AC
  Filled 2019-07-13: qty 10

## 2019-07-13 MED ORDER — SENNOSIDES-DOCUSATE SODIUM 8.6-50 MG PO TABS
1.0000 | ORAL_TABLET | Freq: Every day | ORAL | Status: DC
  Administered 2019-07-14: 21:00:00 1 via ORAL
  Filled 2019-07-13 (×2): qty 1

## 2019-07-13 MED ORDER — PROPOFOL 10 MG/ML IV BOLUS
INTRAVENOUS | Status: AC
  Filled 2019-07-13: qty 20

## 2019-07-13 MED ORDER — HEPARIN (PORCINE) 25000 UT/250ML-% IV SOLN
850.0000 [IU]/h | INTRAVENOUS | Status: DC
  Administered 2019-07-13: 1050 [IU]/h via INTRAVENOUS
  Administered 2019-07-14: 1200 [IU]/h via INTRAVENOUS
  Administered 2019-07-15: 1000 [IU]/h via INTRAVENOUS
  Administered 2019-07-16: 950 [IU]/h via INTRAVENOUS
  Filled 2019-07-13 (×4): qty 250

## 2019-07-13 MED ORDER — PHENYLEPHRINE 40 MCG/ML (10ML) SYRINGE FOR IV PUSH (FOR BLOOD PRESSURE SUPPORT)
PREFILLED_SYRINGE | INTRAVENOUS | Status: AC
  Filled 2019-07-13: qty 10

## 2019-07-13 MED ORDER — SODIUM CHLORIDE 0.9 % IR SOLN
Status: DC | PRN
  Administered 2019-07-13: 1000 mL

## 2019-07-13 MED ORDER — BISACODYL 5 MG PO TBEC
10.0000 mg | DELAYED_RELEASE_TABLET | Freq: Every day | ORAL | Status: DC
  Administered 2019-07-15 – 2019-07-17 (×2): 10 mg via ORAL
  Filled 2019-07-13 (×3): qty 2

## 2019-07-13 MED ORDER — ONDANSETRON HCL 4 MG/2ML IJ SOLN
INTRAMUSCULAR | Status: AC
  Filled 2019-07-13: qty 2

## 2019-07-13 MED ORDER — FENTANYL CITRATE (PF) 250 MCG/5ML IJ SOLN
INTRAMUSCULAR | Status: DC | PRN
  Administered 2019-07-13 (×3): 50 ug via INTRAVENOUS
  Administered 2019-07-13: 100 ug via INTRAVENOUS

## 2019-07-13 MED ORDER — ACETAMINOPHEN 160 MG/5ML PO SOLN
1000.0000 mg | Freq: Four times a day (QID) | ORAL | Status: AC
  Administered 2019-07-14 – 2019-07-16 (×3): 1000 mg via ORAL
  Filled 2019-07-13 (×4): qty 40.6

## 2019-07-13 MED ORDER — SODIUM CHLORIDE 0.9 % IV SOLN: INTRAVENOUS | Status: DC | PRN

## 2019-07-13 MED ORDER — SUCCINYLCHOLINE CHLORIDE 200 MG/10ML IV SOSY
PREFILLED_SYRINGE | INTRAVENOUS | Status: AC
  Filled 2019-07-13: qty 10

## 2019-07-13 MED ORDER — ONDANSETRON HCL 4 MG/2ML IJ SOLN
4.0000 mg | Freq: Four times a day (QID) | INTRAMUSCULAR | Status: DC | PRN
  Filled 2019-07-13: qty 2

## 2019-07-13 MED ORDER — STERILE WATER FOR INJECTION IJ SOLN
INTRAMUSCULAR | Status: AC
  Filled 2019-07-13: qty 10

## 2019-07-13 MED ORDER — PROMETHAZINE HCL 25 MG/ML IJ SOLN
6.2500 mg | Freq: Three times a day (TID) | INTRAMUSCULAR | Status: DC | PRN
  Administered 2019-07-13: 6.25 mg via INTRAVENOUS
  Filled 2019-07-13: qty 1

## 2019-07-13 MED ORDER — INDOCYANINE GREEN 25 MG IV SOLR
INTRAVENOUS | Status: DC | PRN
  Administered 2019-07-13: 5 mg via INTRAVENOUS

## 2019-07-13 MED ORDER — FENTANYL CITRATE (PF) 100 MCG/2ML IJ SOLN
INTRAMUSCULAR | Status: AC
  Administered 2019-07-13: 15:00:00 25 ug via INTRAVENOUS
  Filled 2019-07-13: qty 2

## 2019-07-13 MED ORDER — ROCURONIUM BROMIDE 10 MG/ML (PF) SYRINGE
PREFILLED_SYRINGE | INTRAVENOUS | Status: DC | PRN
  Administered 2019-07-13: 50 mg via INTRAVENOUS
  Administered 2019-07-13: 20 mg via INTRAVENOUS
  Administered 2019-07-13: 50 mg via INTRAVENOUS
  Administered 2019-07-13 (×2): 30 mg via INTRAVENOUS

## 2019-07-13 SURGICAL SUPPLY — 134 items
ADH SKN CLS APL DERMABOND .7 (GAUZE/BANDAGES/DRESSINGS)
APL SKNCLS STERI-STRIP NONHPOA (GAUZE/BANDAGES/DRESSINGS) ×1
BAG SPEC RTRVL LRG 6X4 10 (ENDOMECHANICALS)
BENZOIN TINCTURE PRP APPL 2/3 (GAUZE/BANDAGES/DRESSINGS) ×3 IMPLANT
BLADE CLIPPER SURG (BLADE) IMPLANT
BLADE SURG 10 STRL SS (BLADE) ×3 IMPLANT
BLADE SURG 15 STRL LF DISP TIS (BLADE) ×2 IMPLANT
BLADE SURG 15 STRL SS (BLADE) ×3
BNDG GAUZE ELAST 4 BULKY (GAUZE/BANDAGES/DRESSINGS) IMPLANT
BRUSH SCRUB EZ PLAIN DRY (MISCELLANEOUS) ×18 IMPLANT
CANISTER SUCT 3000ML PPV (MISCELLANEOUS) ×3 IMPLANT
CANISTER WOUND CARE 500ML ATS (WOUND CARE) ×3 IMPLANT
CANISTER WOUNDNEG PRESSURE 500 (CANNISTER) ×3 IMPLANT
CATH THORACIC 20FR (CATHETERS) ×3 IMPLANT
CLIP VESOCCLUDE MED 6/CT (CLIP) ×3 IMPLANT
CLIP VESOCCLUDE SM WIDE 24/CT (CLIP) IMPLANT
CNTNR URN SCR LID CUP LEK RST (MISCELLANEOUS) IMPLANT
CONN ST 1/4X3/8  BEN (MISCELLANEOUS) ×2
CONN ST 1/4X3/8 BEN (MISCELLANEOUS) ×4 IMPLANT
CONN Y 3/8X3/8X3/8  BEN (MISCELLANEOUS)
CONN Y 3/8X3/8X3/8 BEN (MISCELLANEOUS) IMPLANT
CONT SPEC 4OZ STRL OR WHT (MISCELLANEOUS)
COVER SURGICAL LIGHT HANDLE (MISCELLANEOUS) IMPLANT
COVER TRANSDUCER CIVFLEX 5.5 (CAP) ×3 IMPLANT
DERMABOND ADVANCED (GAUZE/BANDAGES/DRESSINGS)
DERMABOND ADVANCED .7 DNX12 (GAUZE/BANDAGES/DRESSINGS) IMPLANT
DRAIN CHANNEL 19F RND (DRAIN) ×3 IMPLANT
DRAIN CHANNEL 28F RND 3/8 FF (WOUND CARE) ×3 IMPLANT
DRAIN CONNECTOR BLAKE 1:1 (MISCELLANEOUS) ×3 IMPLANT
DRAPE CAMERA VIDEO/LASER (DRAPES) ×3 IMPLANT
DRAPE CHEST BREAST 15X10 FENES (DRAPES) ×3 IMPLANT
DRAPE CV SPLIT W-CLR ANES SCRN (DRAPES) IMPLANT
DRAPE LAPAROSCOPIC ABDOMINAL (DRAPES) ×3 IMPLANT
DRAPE ORTHO SPLIT 77X108 STRL (DRAPES) ×3
DRAPE SLUSH/WARMER DISC (DRAPES) IMPLANT
DRAPE SURG ORHT 6 SPLT 77X108 (DRAPES) ×2 IMPLANT
DRAPE WARM FLUID 44X44 (DRAPES) ×3 IMPLANT
DRSG AQUACEL AG ADV 3.5X10 (GAUZE/BANDAGES/DRESSINGS) ×3 IMPLANT
DRSG PAD ABDOMINAL 8X10 ST (GAUZE/BANDAGES/DRESSINGS) IMPLANT
DRSG VAC ATS LRG SENSATRAC (GAUZE/BANDAGES/DRESSINGS) IMPLANT
DRSG VAC ATS MED SENSATRAC (GAUZE/BANDAGES/DRESSINGS) IMPLANT
DRSG VAC ATS SM SENSATRAC (GAUZE/BANDAGES/DRESSINGS) IMPLANT
ELECT BLADE 6.5 EXT (BLADE) ×3 IMPLANT
ELECT CAUTERY BLADE 6.4 (BLADE) ×3 IMPLANT
ELECT REM PT RETURN 9FT ADLT (ELECTROSURGICAL) ×3
ELECTRODE REM PT RTRN 9FT ADLT (ELECTROSURGICAL) ×2 IMPLANT
GAUZE SPONGE 4X4 12PLY STRL (GAUZE/BANDAGES/DRESSINGS) ×3 IMPLANT
GAUZE SPONGE 4X4 12PLY STRL LF (GAUZE/BANDAGES/DRESSINGS) ×3 IMPLANT
GAUZE XEROFORM 5X9 LF (GAUZE/BANDAGES/DRESSINGS) IMPLANT
GLOVE BIO SURGEON STRL SZ 6 (GLOVE) ×6 IMPLANT
GLOVE BIO SURGEON STRL SZ7.5 (GLOVE) ×6 IMPLANT
GLOVE NEODERM STRL 7.5 LF PF (GLOVE) ×4 IMPLANT
GLOVE SURG NEODERM 7.5  LF PF (GLOVE) ×2
GOWN STRL REUS W/ TWL LRG LVL3 (GOWN DISPOSABLE) ×10 IMPLANT
GOWN STRL REUS W/TWL LRG LVL3 (GOWN DISPOSABLE) ×15
HANDPIECE INTERPULSE COAX TIP (DISPOSABLE) ×2
HEMOSTAT POWDER SURGIFOAM 1G (HEMOSTASIS) IMPLANT
HEMOSTAT SURGICEL 2X14 (HEMOSTASIS) IMPLANT
KIT BASIN OR (CUSTOM PROCEDURE TRAY) ×3 IMPLANT
KIT PREVENA INCISION MGT20CM45 (CANNISTER) ×3 IMPLANT
KIT SUCTION CATH 14FR (SUCTIONS) IMPLANT
KIT TURNOVER KIT B (KITS) ×3 IMPLANT
NS IRRIG 1000ML POUR BTL (IV SOLUTION) ×9 IMPLANT
PACK CHEST (CUSTOM PROCEDURE TRAY) ×3 IMPLANT
PACK GENERAL/GYN (CUSTOM PROCEDURE TRAY) IMPLANT
PACK SPY-PHI (KITS) ×3 IMPLANT
PAD ARMBOARD 7.5X6 YLW CONV (MISCELLANEOUS) ×6 IMPLANT
PENCIL BUTTON HOLSTER BLD 10FT (ELECTRODE) ×3 IMPLANT
POUCH ENDO CATCH II 15MM (MISCELLANEOUS) IMPLANT
POUCH SPECIMEN RETRIEVAL 10MM (ENDOMECHANICALS) IMPLANT
SCISSORS LAP 5X35 DISP (ENDOMECHANICALS) IMPLANT
SEALANT PROGEL (MISCELLANEOUS) IMPLANT
SEALANT SURG COSEAL 4ML (VASCULAR PRODUCTS) IMPLANT
SEALANT SURG COSEAL 8ML (VASCULAR PRODUCTS) IMPLANT
SET HNDPC FAN SPRY TIP SCT (DISPOSABLE) ×2 IMPLANT
SHEARS HARMONIC 9CM CVD (BLADE) ×3 IMPLANT
SHEARS HARMONIC HDI 20CM (ELECTROSURGICAL) IMPLANT
SOL ANTI FOG 6CC (MISCELLANEOUS) IMPLANT
SOL PREP PROV IODINE SCRUB 4OZ (MISCELLANEOUS) ×3 IMPLANT
SOLUTION ANTI FOG 6CC (MISCELLANEOUS)
SOLUTION BETADINE 4OZ (MISCELLANEOUS) ×3 IMPLANT
SPECIMEN JAR MEDIUM (MISCELLANEOUS) IMPLANT
SPONGE INTESTINAL PEANUT (DISPOSABLE) IMPLANT
SPONGE LAP 18X18 RF (DISPOSABLE) ×3 IMPLANT
SPONGE TONSIL TAPE 1 RFD (DISPOSABLE) IMPLANT
STAPLER VISISTAT 35W (STAPLE) ×6 IMPLANT
SUT CHROMIC 3 0 SH 27 (SUTURE) ×9 IMPLANT
SUT ETHILON 2 LR (SUTURE) ×6 IMPLANT
SUT ETHILON 3 0 FSL (SUTURE) ×6 IMPLANT
SUT MNCRL AB 4-0 PS2 18 (SUTURE) IMPLANT
SUT PDS AB 1 CTX 36 (SUTURE) ×6 IMPLANT
SUT PDS AB 1 TP1 96 (SUTURE) ×6 IMPLANT
SUT PROLENE 4 0 RB 1 (SUTURE)
SUT PROLENE 4-0 RB1 .5 CRCL 36 (SUTURE) IMPLANT
SUT SILK  1 MH (SUTURE) ×1
SUT SILK 1 MH (SUTURE) ×2 IMPLANT
SUT SILK 1 TIES 10X30 (SUTURE) ×3 IMPLANT
SUT SILK 2 0 SH (SUTURE) IMPLANT
SUT SILK 2 0SH CR/8 30 (SUTURE) IMPLANT
SUT SILK 3 0 SH 30 (SUTURE) IMPLANT
SUT SILK 3 0SH CR/8 30 (SUTURE) IMPLANT
SUT VIC AB 0 CT1 27 (SUTURE)
SUT VIC AB 0 CT1 27XBRD ANBCTR (SUTURE) IMPLANT
SUT VIC AB 0 CTX 27 (SUTURE) IMPLANT
SUT VIC AB 1 CTX 27 (SUTURE) ×15 IMPLANT
SUT VIC AB 1 CTX 36 (SUTURE)
SUT VIC AB 1 CTX36XBRD ANBCTR (SUTURE) IMPLANT
SUT VIC AB 2-0 CT1 27 (SUTURE)
SUT VIC AB 2-0 CT1 TAPERPNT 27 (SUTURE) IMPLANT
SUT VIC AB 2-0 CTX 27 (SUTURE) IMPLANT
SUT VIC AB 2-0 CTX 36 (SUTURE) IMPLANT
SUT VIC AB 3-0 MH 27 (SUTURE) IMPLANT
SUT VIC AB 3-0 SH 27 (SUTURE)
SUT VIC AB 3-0 SH 27X BRD (SUTURE) IMPLANT
SUT VIC AB 3-0 X1 27 (SUTURE) IMPLANT
SUT VICRYL 0 UR6 27IN ABS (SUTURE) IMPLANT
SUT VICRYL 2 TP 1 (SUTURE) IMPLANT
SWAB COLLECTION DEVICE MRSA (MISCELLANEOUS) IMPLANT
SWAB CULTURE ESWAB REG 1ML (MISCELLANEOUS) IMPLANT
SYR 30ML LL (SYRINGE) ×3 IMPLANT
SYR 5ML LL (SYRINGE) ×3 IMPLANT
SYSTEM SAHARA CHEST DRAIN ATS (WOUND CARE) ×3 IMPLANT
SYSTEM SAHARA CHEST DRAIN RE-I (WOUND CARE) ×6 IMPLANT
TAPE CLOTH SURG 4X10 WHT LF (GAUZE/BANDAGES/DRESSINGS) ×6 IMPLANT
TAPE UMBILICAL 1/8 X36 TWILL (MISCELLANEOUS) ×3 IMPLANT
TIP APPLICATOR SPRAY EXTEND 16 (VASCULAR PRODUCTS) IMPLANT
TOWEL GREEN STERILE (TOWEL DISPOSABLE) ×3 IMPLANT
TOWEL GREEN STERILE FF (TOWEL DISPOSABLE) ×3 IMPLANT
TRAY FOLEY MTR SLVR 16FR STAT (SET/KITS/TRAYS/PACK) IMPLANT
TROCAR XCEL BLADELESS 5X75MML (TROCAR) IMPLANT
TROCAR XCEL NON-BLD 5MMX100MML (ENDOMECHANICALS) IMPLANT
TUBE CONNECTING 20X1/4 (TUBING) ×3 IMPLANT
WATER STERILE IRR 1000ML POUR (IV SOLUTION) ×3 IMPLANT
YANKAUER SUCT BULB TIP NO VENT (SUCTIONS) ×3 IMPLANT

## 2019-07-13 NOTE — Progress Notes (Addendum)
Advanced Heart Failure Rounding Note   Subjective:    S/p thoracotomy on 2/11.  S/P Greater Omental Flap Closure today CT x2   Pleural fluid GS +++GPC. NGTD  Bacnk in NSR.  Remains on amio 30 mg per hour.   Drowsy just had pain medication.    Objective:   Weight Range:  Vital Signs:   Temp:  [97.6 F (36.4 C)-98.6 F (37 C)] 97.6 F (36.4 C) (02/16 1211) Pulse Rate:  [71-109] 90 (02/16 1445) Resp:  [12-22] 18 (02/16 1445) BP: (95-169)/(49-88) 132/62 (02/16 1445) SpO2:  [98 %-100 %] 99 % (02/16 1449) Last BM Date: 07/12/19  Weight change: Filed Weights   07/07/19 1837 07/11/19 0300 07/12/19 0400  Weight: 71.6 kg 76 kg 77.5 kg    Intake/Output:   Intake/Output Summary (Last 24 hours) at 07/13/2019 1504 Last data filed at 07/13/2019 1240 Gross per 24 hour  Intake 2745.75 ml  Output 2035 ml  Net 710.75 ml     Physical Exam: General:  Appears chronically ill.  No resp difficulty HEENT: normal Neck: supple. no JVD. Carotids 2+ bilat; no bruits. No lymphadenopathy or thryomegaly appreciated. Cor: PMI nondisplaced. Regular rate & rhythm. No rubs, gallops or murmurs. Lungs: clear on 2 liters [place] CT x2 on left. Prevena Wound VAC  Abdomen: soft, nontender, nondistended. No hepatosplenomegaly. No bruits or masses. Good bowel sounds. Extremities: no cyanosis, clubbing, rash, edema Neuro: Drowsy cranial nerves grossly intact. moves all 4 extremities w/o difficulty.    Telemetry: SR 80s personally reviewed.    Labs: Basic Metabolic Panel: Recent Labs  Lab 07/09/19 0430 07/09/19 1930 07/10/19 0430 07/10/19 0430 07/11/19 0338 07/11/19 0338 07/11/19 1714 07/12/19 0501 07/13/19 0520  NA  --    < > 141  --  140  --  137 139 141  K  --    < > 4.2  --  3.4*  --  3.8 4.1 3.3*  CL  --    < > 105  --  104  --  104 102 106  CO2  --    < > 27  --  29  --  _0 GLUCOSE  --    < > 70  --  131*  --  170* 113* 89  BUN  --    < > 7*  --  8  --  8 7* 7*    CREATININE  --    < > 0.75  --  0.71  --  0.58 0.60 0.61  CALCIUM  --    < > 7.6*   < > 7.2*   < > 7.3* 7.5* 7.4*  MG 1.2*  --  1.1*  --   --   --   --   --   --    < > = values in this interval not displayed.    Liver Function Tests: Recent Labs  Lab 07/11/19 0338 07/12/19 0501 07/13/19 0520  AST _1 ALT _2 ALKPHOS 43 47 45  BILITOT 0.7 0.7 0.6  PROT 5.1* 5.0* 4.6*  ALBUMIN 1.6* 1.8* 1.6*   No results for input(s): LIPASE, AMYLASE in the last 168 hours. No results for input(s): AMMONIA in the last 168 hours.  CBC: Recent Labs  Lab 07/10/19 0430 07/11/19 0338 07/11/19 1623 07/12/19 0501 07/13/19 0520  WBC 11.9* 10.5 9.8 8.5 6.0  HGB 8.7* 8.6* 8.6* 8.1* 6.9*  HCT 26.9* 26.6* 27.0* 25.0* 22.3*  MCV 88.5 87.8 89.1 89.3 91.4  PLT 203 204 197 223 211    Cardiac Enzymes: No results for input(s): CKTOTAL, CKMB, CKMBINDEX, TROPONINI in the last 168 hours.  BNP: BNP (last 3 results) No results for input(s): BNP in the last 8760 hours.  ProBNP (last 3 results) No results for input(s): PROBNP in the last 8760 hours.    Other results:  Imaging: CT ABDOMEN PELVIS WO CONTRAST  Result Date: 07/12/2019 CLINICAL DATA:  Preoperative evaluation for omental flap repair of thoracic defect. EXAM: CT ABDOMEN AND PELVIS WITHOUT CONTRAST TECHNIQUE: Multidetector CT imaging of the abdomen and pelvis was performed following the standard protocol without IV contrast. COMPARISON:  07/07/2019 FINDINGS: Lower chest: Bilateral lower lobe collapse/consolidation. Lower left thoracic wall wound noted with left chest tube in situ. Packing material and gas noted in the lower left pleural space. Hepatobiliary: No focal abnormality in the liver on this study without intravenous contrast. 11 mm calcified gallstone evident. No intrahepatic or extrahepatic biliary dilation. Pancreas: No focal mass lesion. No dilatation of the main duct. No intraparenchymal cyst. No peripancreatic edema.  Spleen: No splenomegaly. No focal mass lesion. Adrenals/Urinary Tract: No adrenal nodule or mass. No hydronephrosis in either kidney. No evidence for hydroureter. Bladder decompressed by Foley catheter. Stomach/Bowel: Stomach is unremarkable. No gastric wall thickening. No evidence of outlet obstruction. Duodenum is normally positioned as is the ligament of Treitz. No small bowel wall thickening. No small bowel dilatation. No colonic dilatation. Vascular/Lymphatic: There is abdominal aortic atherosclerosis without aneurysm. There is no gastrohepatic or hepatoduodenal ligament lymphadenopathy. No retroperitoneal or mesenteric lymphadenopathy. No pelvic sidewall lymphadenopathy. Reproductive: The uterus is unremarkable.  There is no adnexal mass. Other: Small volume free fluid noted adjacent to the liver and spleen. Small volume free fluid noted in the cul-de-sac. Musculoskeletal: Diffuse body wall edema. No worrisome lytic or sclerotic osseous abnormality. Mild anterolisthesis noted L4 on 5 and L5 on S1. IMPRESSION: 1. No evidence for abdominal wall or intra-abdominal mass lesion 2. Diffuse body wall edema. 3. Left-sided open thoracic surgical wound status post left thoracotomy for empyema drainage. 4. Cholelithiasis. 5.  Aortic Atherosclerois (ICD10-170.0) Electronically Signed   By: Misty Stanley M.D.   On: 07/12/2019 09:23   DG Chest Port 1 View  Result Date: 07/13/2019 CLINICAL DATA:  Postop empyema surgery. EXAM: PORTABLE CHEST 1 VIEW COMPARISON:  Chest CT 07/07/2019 and chest x-ray 07/12/2019 FINDINGS: Bilateral chest tubes in place. No pneumothorax is identified. Left IJ central venous catheter is stable. No persistent left-sided pleural effusion is identified. There is moderate left lower lobe atelectasis. Stable portal cardiac enlargement. IMPRESSION: 1. Bilateral chest tubes in place without pneumothorax. 2. Persistent left lower lobe atelectasis. Electronically Signed   By: Marijo Sanes M.D.   On:  07/13/2019 12:23   DG Chest Port 1 View  Result Date: 07/12/2019 CLINICAL DATA:  Pneumothorax on left. Additional history provided: Chest tube present, pneumothorax, lung surgery EXAM: PORTABLE CHEST 1 VIEW COMPARISON:  Chest radiograph 07/11/2019 FINDINGS: Unchanged position of a left IJ approach central venous catheter with tip projecting in the region of the upper right atrium. Unchanged position of a left-sided chest tube. As before, the chest tube is kinked at the level of the thoracic wall. Overlying cardiac monitoring leads. The cardiomediastinal silhouette is unchanged. Aortic atherosclerosis. No visible pneumothorax. Persistent left pleural effusion with associated left basilar atelectasis and/or consolidation. Persistent trace right pleural effusion. IMPRESSION: No significant interval change as compared to chest radiograph 07/11/2019, as detailed. As  before, the left-sided chest tube appears slightly kinked at the level of the chest wall. No visible pneumothorax. Redemonstrated left pleural effusion with left basilar atelectasis and/or consolidation. Persistent small right pleural effusion. Electronically Signed   By: Kellie Simmering DO   On: 07/12/2019 07:47     Medications:     Scheduled Medications: . celecoxib  200 mg Per Tube BID  . chlorhexidine gluconate (MEDLINE KIT)  15 mL Mouth Rinse BID  . Chlorhexidine Gluconate Cloth  6 each Topical Daily  . docusate sodium  100 mg Oral Daily  . feeding supplement (ENSURE ENLIVE)  237 mL Oral TID WC  . fentaNYL      . furosemide  20 mg Intravenous Once  . levalbuterol  0.63 mg Nebulization TID  . mouth rinse  15 mL Mouth Rinse BID  . mometasone-formoterol  2 puff Inhalation BID  . montelukast  10 mg Per Tube Daily  . pantoprazole  40 mg Oral Daily    Infusions: . sodium chloride 50 mL/hr (07/13/19 1412)  . sodium chloride    . amiodarone 30 mg/hr (07/13/19 1143)  . heparin Stopped (07/13/19 0615)  . norepinephrine (LEVOPHED) Adult  infusion Stopped (07/11/19 0802)  . 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 Wound vac in place -S/P Greater Omental Flap Closure today CT x2  - GS with +++GPC. Culture NGTD - on Vancomycin.  - TCTS managing.  2. PAH - very mild by cath PVR < 1.0 - Sildenafil off with low BP.  - Restart once improved.   3. PAF -Back in NSR today.  Continue amio 30 mg per hour + heparin drip.   4. Volume overload -LVEF normal RV normal.  - Continue ted hose. Weight remains up. SBP soft.  Last dose of IV lasix was on 2/13.  - SBP low.   5. Hypokalemia/Hypomagnesemia K stable.   6. Severe protein calorie malnutrition - will get Nutrition consult  7. Anemia  hgb down to 6.9 this morning  - Repeat CBC pending.   Length of Stay: [place] NP-C  07/13/2019, 3:04 PM  Advanced Heart Failure Team Pager [phone] (M-F; 7a - 4p)  Please contact [facility] for night-coverage after hours (4p -7a ) and weekends on [email or web address]  Patient seen and examined with the above-signed Advanced Practice Provider and/or Housestaff. I personally reviewed laboratory data, imaging studies and relevant notes. I independently examined the patient and formulated the important aspects of the plan. I have edited the note to reflect any of my changes or salient points. I have personally discussed the plan with the patient and/or family.  Patient stable today after omental flap and chest closure. CT x 2 in place. On broad spectrum abx.   Remains sleepy after surgery. Back in NSR on amio.  Volume overloaded on exam. Agree with IV lasix.    Will continue to follow.   Glori Bickers, MD  5:12 PM

## 2019-07-13 NOTE — Progress Notes (Signed)
[facility] [place] for heparin Indication: atrial fibrillation  Allergies  Allergen Reactions  . Codeine Nausea And Vomiting  . Gluten Meal Diarrhea    Abdominal pain and bloating  . Lactose Diarrhea    Bloating and abdominal pain  . Neosporin [Neomycin-Bacitracin Zn-Polymyx]   . Other     Pain medication pt. Cannot remember the name.    Patient Measurements: Height: 5' (152.4 cm) Weight: 170 lb 13.7 oz (77.5 kg) IBW/kg (Calculated) : 45.5 Heparin Dosing Weight: 61kg  Vital Signs: Temp: 97.6 F (36.4 C) (02/16 1211) Temp Source: Oral (02/16 0424) BP: 132/62 (02/16 1445) Pulse Rate: 90 (02/16 1445)  Labs: Recent Labs    07/11/19 0338 07/11/19 1623 07/11/19 1714 07/12/19 0501 07/12/19 1500 07/13/19 0520  HGB   < > 8.6*  --  8.1*  --  6.9*  HCT  --  27.0*  --  25.0*  --  22.3*  PLT  --  197  --  223  --  211  APTT  --   --   --   --   --  184*  LABPROT  --   --   --   --   --  15.6*  INR  --   --   --   --   --  1.2  HEPARINUNFRC   < >  --   --  0.46 0.32 0.42  CREATININE   < >  --  0.58 0.60  --  0.61   < > = values in this interval not displayed.    Estimated Creatinine Clearance: 55.1 mL/min (by C-G formula based on SCr of 0.61 mg/dL).  Assessment: 79 year old female s/p thoracotomy and drainage of empyema. Currently on heparin for in/out AFib.   Patient now s/p flap closure and heparin IV was stopped this morning for procedure. Heparin level was on higher end of therapeutic at 0.42 after decreasing drip rate to 1100 units/hr yesterday. Hgb this AM low at 6.9, given 2u PRBC. Plts wnl. EBL during procedure <50 ml. Restarting heparin 6-hrs after end of procedure.   Goal of Therapy:  Heparin level 0.3-0.5 units/ml Aim for ~0.3 Monitor platelets by anticoagulation protocol: Yes   Plan:  -Restart heparin infusion at 1050 units/hr at 1800 -Check 6-hr heparin level -Monitor HL, CBC, and for s/sx of bleeding    Richardine Service,  PharmD PGY1 Pharmacy Resident Phone: [phone] 07/13/2019  3:37 PM  Please check [email or web address] for unit-specific pharmacy phone numbers.

## 2019-07-13 NOTE — Anesthesia Procedure Notes (Signed)
Procedure Name: Intubation Date/Time: 07/13/2019 8:05 AM Performed by: Larene Beach, CRNA Pre-anesthesia Checklist: Patient identified, Emergency Drugs available, Suction available and Patient being monitored Patient Re-evaluated:Patient Re-evaluated prior to induction Oxygen Delivery Method: Circle system utilized Preoxygenation: Pre-oxygenation with 100% oxygen Induction Type: IV induction Ventilation: Mask ventilation without difficulty Laryngoscope Size: Mac and 3 Grade View: Grade I Tube type: Oral Tube size: 7.0 mm Number of attempts: 1 Airway Equipment and Method: Stylet Placement Confirmation: ETT inserted through vocal cords under direct vision,  positive ETCO2 and breath sounds checked- equal and bilateral Secured at: 21 cm Tube secured with: Tape Dental Injury: Teeth and Oropharynx as per pre-operative assessment

## 2019-07-13 NOTE — Plan of Care (Signed)

## 2019-07-13 NOTE — Transfer of Care (Signed)
Immediate Anesthesia Transfer of Care Note  Patient: Connie Jarvis  Procedure(s) Performed: GREATER OMENTAL FLAP CLOSURE (Left ) CLOSURE OF THORACOTOMY (Left ) APPLICATION OF WOUND VAC USING PREVENA DRESSING (Left ) Chest Tube Insertion (Right ) Vascular Assessment With Spy Elite (Left )  Patient Location: PACU  Anesthesia Type:General  Level of Consciousness: drowsy and patient cooperative  Airway & Oxygen Therapy: Patient Spontanous Breathing and Patient connected to nasal cannula oxygen  Post-op Assessment: Report given to RN, Post -op Vital signs reviewed and stable and Patient moving all extremities X 4  Post vital signs: Reviewed and stable  Last Vitals:  Vitals Value Taken Time  BP    Temp    Pulse    Resp    SpO2      Last Pain:  Vitals:   07/13/19 0424  TempSrc: Oral  PainSc:       Patients Stated Pain Goal: 0 ([id number])  Complications: No apparent anesthesia complications

## 2019-07-13 NOTE — Progress Notes (Signed)
Pharmacy Antibiotic Note  Connie Jarvis is a 79 y.o. female admitted on 07/07/2019 with redness and eruption of pus on the left interior chest wall.  Found to have empyema and Pharmacy has been consulted for vancomycin dosing.  She will also be started on Zosyn.  SCr 0.53, CrCL 53 ml/min, afebrile, WBC WNL.  2/16 PM update:  Vancomycin AUC is within goal range at 535 Scr remains stable  Plan: Cont vancomycin 1000 mg IV q24h Re-check vancomycin levels as needed  Height: 5' (152.4 cm) Weight: 170 lb 13.7 oz (77.5 kg) IBW/kg (Calculated) : 45.5  Temp (24hrs), Avg:98 F (36.7 C), Min:97.6 F (36.4 C), Max:98.4 F (36.9 C)  Recent Labs  Lab 07/10/19 0430 07/11/19 0338 07/11/19 1623 07/11/19 1714 07/12/19 0501 07/13/19 0100 07/13/19 0520 07/13/19 2220  WBC 11.9* 10.5 9.8  --  8.5  --  6.0  --   CREATININE 0.75 0.71  --  0.58 0.60  --  0.61  --   VANCOTROUGH  --   --   --   --   --   --   --  15  VANCOPEAK  --   --   --   --   --  29*  --   --     Estimated Creatinine Clearance: 55.1 mL/min (by C-G formula based on SCr of 0.61 mg/dL).    Allergies  Allergen Reactions  . Codeine Nausea And Vomiting  . Gluten Meal Diarrhea    Abdominal pain and bloating  . Lactose Diarrhea    Bloating and abdominal pain  . Neosporin [Neomycin-Bacitracin Zn-Polymyx]   . Other     Pain medication pt. Cannot remember the name.    Vanc 2/10 >>  Narda Bonds, PharmD, BCPS Clinical Pharmacist Phone: [phone]

## 2019-07-13 NOTE — Anesthesia Procedure Notes (Signed)
Arterial Line Insertion Start/End2/16/2021 7:10 AM, 07/13/2019 7:20 AM Performed by: Roderic Palau, MD  Patient location: Pre-op. Preanesthetic checklist: patient identified, IV checked, site marked, risks and benefits discussed, surgical consent, monitors and equipment checked, pre-op evaluation, timeout performed and anesthesia consent Lidocaine 1% used for infiltration Left, radial was placed Catheter size: 20 G Hand hygiene performed  and maximum sterile barriers used   Attempts: 1 Procedure performed using ultrasound guided technique. Ultrasound Notes:anatomy identified, needle tip was noted to be adjacent to the nerve/plexus identified, no ultrasound evidence of intravascular and/or intraneural injection and image(s) printed for medical record Following insertion, dressing applied and Biopatch. Post procedure assessment: normal and unchanged  Patient tolerated the procedure well with no immediate complications.

## 2019-07-13 NOTE — Progress Notes (Signed)
Patient prepared for surgery this morning per orders. I assisted patient to pre op and gave report. Heparin was stopped on call. Antibiotic sent on call. Patient stable no complaint or complications this morning. Husband called and updated per patient request.

## 2019-07-13 NOTE — Op Note (Signed)
Procedure(s): GREATER OMENTAL FLAP CLOSURE CLOSURE OF THORACOTOMY APPLICATION OF WOUND VAC USING PREVENA DRESSING Right Chest Tube Insertion Vascular Assessment With Spy Elite Procedure Note  Connie Jarvis female 79 y.o. 07/13/2019  Procedure(s) and Anesthesia Type:    * GREATER OMENTAL FLAP CLOSURE - General    * CLOSURE OF THORACOTOMY - General    * APPLICATION OF WOUND VAC USING PREVENA DRESSING - General    * Chest Tube Insertion    * Vascular Assessment With Spy Elite  Surgeon(s) and Role:    Wonda Olds, MD - Primary   Indications: The patient was admitted to the hospital with a left chest empyema which had erupted onto the left chest wall. This has been managed with local wound care and wound vac therapy. Now presents for attempted closure with omental flap repair.      Surgeon: Wonda Olds   Assistants: staff  Anesthesia: General endotracheal anesthesia  ASA Class: 3    Procedure Detail  GREATER OMENTAL FLAP CLOSURE, CLOSURE OF THORACOTOMY, APPLICATION OF WOUND VAC USING PREVENA DRESSING, Chest Tube Insertion, Vascular Assessment With Spy Elite  After informed consent, the patient was taken to the operating room at [facility] on 07/13/2019.  She was placed in the supine position on the operating table.  Anesthesia was begun in a smooth fashion by the general endotracheal technique.  After confirming adequate anesthesia, the anterior abdomen and chest were prepped and draped as a sterile field using Betadine solution.  A preop surgical pause was performed.  The right chest tube was inserted in the sixth intercostal space in the anterior axillary line with a return of clear fluid from the right chest.  This was secured at the skin.  Next, a midline upper abdominal incision was made and the abdomen was entered safely without injury to underlying structures.  The omentum was mobilized off of the transverse colon and then off of the greater curvature of  the stomach using a harmonic scalpel.  With this was fully mobilized this was placed in a plastic camera back and inserted in the left upper quadrant of the abdomen against the left hemidiaphragm.  The abdomen was then closed with a looped PDS suture at the fascial level followed by skin staples.,  The patient was placed in the right lateral decubitus position.  The left chest was prepped and draped with Betadine solution.  The existing wound VAC sponge were removed from the left chest and copious irrigation was undertaken.  Through a small incision at the left hemidiaphragm the omentum was identified and pulled into the chest defect.  This filled the cavity well. Indocyanine green fluorescence imaging was used to confirm adequate blood supply to the omentum and that in bringing it through the diaphragm the blood supply wasn't twisted or kinked.  2 drains were placed around the omentum,  1 beneath and one on top.  The wound was then closed in layers.  Staples were used to reapproximate the skin.  The Prevena incisional VAC was placed in the closed incisions with sterile dressing.  This concluded the procedure.  All sponge instrument and needle counts were correct and I was present and participated in all aspects. Estimated Blood Loss:  less than 50 mL         Drains: Bard fluted tubes and one right pleural chest tube          Blood Given: 500 CC PRBC  Specimens: none         Implants: none        Complications:  * No complications entered in OR log *         Disposition: ICU - extubated and stable.         Condition: stable

## 2019-07-13 NOTE — Anesthesia Postprocedure Evaluation (Signed)
Anesthesia Post Note  Patient: Connie Jarvis  Procedure(s) Performed: GREATER OMENTAL FLAP CLOSURE (Left ) CLOSURE OF THORACOTOMY (Left ) APPLICATION OF WOUND VAC USING PREVENA DRESSING (Left ) Chest Tube Insertion (Right ) Vascular Assessment With Spy Elite (Left )     Patient location during evaluation: PACU Anesthesia Type: General Level of consciousness: awake and alert Pain management: pain level controlled Vital Signs Assessment: post-procedure vital signs reviewed and stable Respiratory status: spontaneous breathing, nonlabored ventilation, respiratory function stable and patient connected to nasal cannula oxygen Cardiovascular status: blood pressure returned to baseline and stable Postop Assessment: no apparent nausea or vomiting Anesthetic complications: no    Last Vitals:  Vitals:   07/13/19 1240 07/13/19 1345  BP: (!) 163/80 139/64  Pulse: 77 89  Resp: 13   Temp:    SpO2: 100% 98%    Last Pain:  Vitals:   07/13/19 1240  TempSrc:   PainSc: Asleep                 Ason Heslin,W. EDMOND

## 2019-07-13 NOTE — Brief Op Note (Signed)
07/07/2019 - 07/13/2019  12:04 PM  PATIENT:  Connie Jarvis  79 y.o. female  PRE-OPERATIVE DIAGNOSIS:  CHEST WOUND EMPYEMA  POST-OPERATIVE DIAGNOSIS:  CHEST WOUND EMPYEMA  PROCEDURE:  Procedure(s): GREATER OMENTAL FLAP CLOSURE (Left) CLOSURE OF THORACOTOMY (Left) APPLICATION OF WOUND VAC USING PREVENA DRESSING (Left) Chest Tube Insertion (Right) Vascular Assessment With Spy Elite (Left)  SURGEON:  Surgeon(s) and Role:    * Wonda Olds, MD - Primary  PHYSICIAN ASSISTANT: n/a ASSISTANTS: staff   ANESTHESIA:   general  EBL:  40 mL   BLOOD ADMINISTERED:500 CC PRBC  DRAINS: 2 Chest Tube(s) in the left pleural space   LOCAL MEDICATIONS USED:  NONE  SPECIMEN:  No Specimen  DISPOSITION OF SPECIMEN:  N/A  COUNTS:  YES  TOURNIQUET:  * No tourniquets in log *  DICTATION: .Note written in EPIC  PLAN OF CARE: Admit to inpatient   PATIENT DISPOSITION:  ICU - extubated and stable.   Delay start of Pharmacological VTE agent (>24hrs) due to surgical blood loss or risk of bleeding: no

## 2019-07-13 NOTE — H&P (Signed)
History and Physical Interval Note:  07/13/2019 7:23 AM  Connie Jarvis  has presented today for surgery, with the diagnosis of CHEST WOUND EMPYEMA.  The various methods of treatment have been discussed with the patient and family. After consideration of risks, benefits and other options for treatment, the patient has consented to  Procedure(s): GREATER OMENTAL FLAP CLOSURE (Left) CLOSURE OF THORACOTOMY (Left) possible APPLICATION OF WOUND VAC (Left) as a surgical intervention.  The patient's history has been reviewed, patient examined, no change in status, stable for surgery.  I have reviewed the patient's chart and labs.  Questions were answered to the patient's satisfaction.     Wonda Olds

## 2019-07-14 LAB — BPAM RBC
Blood Product Expiration Date: 202103012359
Blood Product Expiration Date: 202103012359
ISSUE DATE / TIME: 202102160755
ISSUE DATE / TIME: 202102160755
Unit Type and Rh: 6200
Unit Type and Rh: 6200

## 2019-07-14 LAB — AEROBIC/ANAEROBIC CULTURE W GRAM STAIN (SURGICAL/DEEP WOUND): Gram Stain: NONE SEEN

## 2019-07-14 LAB — TYPE AND SCREEN
ABO/RH(D): A POS
Antibody Screen: NEGATIVE
Unit division: 0
Unit division: 0

## 2019-07-14 LAB — COMPREHENSIVE METABOLIC PANEL
ALT: 15 U/L (ref 0–44)
AST: 20 U/L (ref 15–41)
Albumin: 1.7 g/dL — ABNORMAL LOW (ref 3.5–5.0)
Alkaline Phosphatase: 47 U/L (ref 38–126)
Anion gap: 6 (ref 5–15)
BUN: 11 mg/dL (ref 8–23)
CO2: 27 mmol/L (ref 22–32)
Calcium: 7.6 mg/dL — ABNORMAL LOW (ref 8.9–10.3)
Chloride: 104 mmol/L (ref 98–111)
Creatinine, Ser: 0.77 mg/dL (ref 0.44–1.00)
GFR calc Af Amer: >60 mL/min (ref >60)
GFR calc non Af Amer: >60 mL/min (ref >60)
Glucose, Bld: 141 mg/dL — ABNORMAL HIGH (ref 70–99)
Potassium: 4.5 mmol/L (ref 3.5–5.1)
Sodium: 137 mmol/L (ref 135–145)
Total Bilirubin: 0.6 mg/dL (ref 0.3–1.2)
Total Protein: 4.6 g/dL — ABNORMAL LOW (ref 6.5–8.1)

## 2019-07-14 LAB — BLOOD GAS, ARTERIAL
Acid-Base Excess: 3.2 mmol/L — ABNORMAL HIGH (ref 0.0–2.0)
Bicarbonate: 27.9 mmol/L (ref 20.0–28.0)
FIO2: 28
O2 Saturation: 98.2 %
Patient temperature: 37
pCO2 arterial: 48.3 mmHg — ABNORMAL HIGH (ref 32.0–48.0)
pH, Arterial: 7.38 (ref 7.350–7.450)
pO2, Arterial: 102 mmHg (ref 83.0–108.0)

## 2019-07-14 LAB — CBC
HCT: 33.9 % — ABNORMAL LOW (ref 36.0–46.0)
Hemoglobin: 11.2 g/dL — ABNORMAL LOW (ref 12.0–15.0)
MCH: 29.7 pg (ref 26.0–34.0)
MCHC: 33 g/dL (ref 30.0–36.0)
MCV: 89.9 fL (ref 80.0–100.0)
Platelets: 236 10*3/uL (ref 150–400)
RBC: 3.77 MIL/uL — ABNORMAL LOW (ref 3.87–5.11)
RDW: 17.2 % — ABNORMAL HIGH (ref 11.5–15.5)
WBC: 13.1 10*3/uL — ABNORMAL HIGH (ref 4.0–10.5)
nRBC: 0.2 % (ref 0.0–0.2)

## 2019-07-14 LAB — HEPARIN LEVEL (UNFRACTIONATED)
Heparin Unfractionated: 0.22 IU/mL — ABNORMAL LOW (ref 0.30–0.70)
Heparin Unfractionated: 0.29 IU/mL — ABNORMAL LOW (ref 0.30–0.70)

## 2019-07-14 LAB — ACID FAST SMEAR (AFB, MYCOBACTERIA): Acid Fast Smear: NEGATIVE

## 2019-07-14 MED ORDER — FUROSEMIDE 10 MG/ML IJ SOLN
40.0000 mg | Freq: Two times a day (BID) | INTRAMUSCULAR | Status: DC
  Administered 2019-07-14 – 2019-07-15 (×3): 40 mg via INTRAVENOUS
  Filled 2019-07-14 (×3): qty 4

## 2019-07-14 MED ORDER — PENICILLIN G POTASSIUM 20000000 UNITS IJ SOLR
6.0000 10*6.[IU] | Freq: Two times a day (BID) | INTRAVENOUS | Status: DC
  Administered 2019-07-14 – 2019-07-17 (×7): 6 10*6.[IU] via INTRAVENOUS
  Filled 2019-07-14 (×9): qty 6

## 2019-07-14 NOTE — Progress Notes (Signed)
1 Day Post-Op Procedure(s) (LRB): GREATER OMENTAL FLAP CLOSURE (Left) CLOSURE OF THORACOTOMY (Left) APPLICATION OF WOUND VAC USING PREVENA DRESSING (Left) Chest Tube Insertion (Right) Vascular Assessment With Spy Elite (Left) Subjective: Awake and alert, reports drainage from around the chest tubes. She says pain is controlled. Denies nausea, she has ben taking some clear liquids. Amiodarone infusing at 1mg /min and heparin drip is infusing as well.   Objective: Vital signs in last 24 hours: Temp:  [97.6 F (36.4 C)-98.4 F (36.9 C)] 97.7 F (36.5 C) (02/17 1002) Pulse Rate:  [78-93] 86 (02/17 1002) Cardiac Rhythm: Normal sinus rhythm (02/17 0700) Resp:  [12-21] 21 (02/17 1200) BP: (80-138)/(53-95) 128/65 (02/17 1200) SpO2:  [98 %-100 %] 100 % (02/17 1002) Weight:  [81.6 kg] 81.6 kg (02/17 1200)   Intake/Output from previous day: 02/16 0701 - 02/17 0700 In: 3488.9 [P.O.:150; I.V.:2183.8; Blood:630; IV Piggyback:500.1] Out: 844 [Urine:225; Blood:40; Chest Tube:579] Intake/Output this shift: Total I/O In: -  Out: 510 [Chest Tube:510]  General appearance: alert, cooperative and mild distress Neurologic: no gross deficits Heart: regular rate and rhythm, a-fib earlier this morning.  Lungs: Breath sounds are clear, 367ml serous drainage in both PleurEvacs. No air leak on either side.  Abdomen: soft, rare bowel sounds, expected peri-incisional tenderness Extremities: All warm and well perfused. There is a left brachial arterial line.  Wound: The left chest wound is covered with a compressed Prevena dressing. An Aquacel bandage covers the midline abdominal incision and is dry.  The dressings around the bilateral pleural drains are saturated with serous fluid.   Lab Results: Recent Labs    07/13/19 0520 07/14/19 0502  WBC 6.0 13.1*  HGB 6.9* 11.2*  HCT 22.3* 33.9*  PLT 211 236   BMET:  Recent Labs    07/13/19 0520 07/14/19 0502  NA 141 137  K 3.3* 4.5  CL 106 104  CO2  31 27  GLUCOSE 89 141*  BUN 7* 11  CREATININE 0.61 0.77  CALCIUM 7.4* 7.6*    PT/INR:  Recent Labs    07/13/19 0520  LABPROT 15.6*  INR 1.2   ABG    Component Value Date/Time   PHART 7.380 07/14/2019 0436   HCO3 27.9 07/14/2019 0436   TCO2 30 07/09/2019 0427   O2SAT 98.2 07/14/2019 0436   CBG (last 3)  No results for input(s): GLUCAP in the last 72 hours.  Assessment/Plan: S/P Procedure(s) (LRB): GREATER OMENTAL FLAP CLOSURE (Left) CLOSURE OF THORACOTOMY (Left) APPLICATION OF WOUND VAC USING PREVENA DRESSING (Left) Chest Tube Insertion (Right) Vascular Assessment With Spy Elite (Left)  78yof with left empyema and subsequent erosion through the left lateral chest wall.   -POD-6 left thoracotomy for drainage of empyema, debridement of the left chest wall wound, and placement of a wound vac. The vac was changed in the OR 3 days later.  Initial wound Cx + for Strept intermedius.     -POD-1 closure of the open left chest wound with greater omental flap and drainage of a right pleural effusion and chest tube placement. The chest wound is covered with a compressed Prevena dressing that will remain in place for several more days. Change ABX to PCN-G continuous infusion. Mobilize. Remove the arterial line.  -Paroxysmal atrial fibrillation- Currently back to SR on IV amiodarone, continue heparin managed by pharmacy.   -Volume overload- Agree with adding Lasix as ordered by the heart failure team.  -Expected acute blood loss anemia- mild, monitor.    LOS: 7 days  Antony Odea, PA-C [phone] 07/14/2019

## 2019-07-14 NOTE — Plan of Care (Signed)

## 2019-07-14 NOTE — Progress Notes (Signed)
Pharmacy note - antibiotics  Patient on vancomycin for empyema.  Cultures revealed penicillin sensitive streptococcus intermedius.  Gorden Harms PharmD discussed with Dr. Julien Girt - will narrow antibiotics to penicillin.  Will give 12 million units daily as a continuous infusion.  When ready for oral therapy, would recommend amoxicillin 500mg  po TID.  Heide Guile, PharmD, BCPS-AQ ID Clinical Pharmacist

## 2019-07-14 NOTE — Progress Notes (Addendum)
Advanced Heart Failure Rounding Note   Subjective:    S/p thoracotomy on 2/11.  S/P Greater Omental Flap Closure today CT x2   Pleural fluid GS +++GPC. Cx back today Strep intermedius.   In NSR. On amio 30 mg per hour.   SOB this morning.    Objective:   Weight Range:  Vital Signs:   Temp:  [97.6 F (36.4 C)-98.4 F (36.9 C)] 97.9 F (36.6 C) (02/17 0800) Pulse Rate:  [73-93] 78 (02/17 0800) Resp:  [12-22] 19 (02/17 0831) BP: (80-169)/(53-95) 111/66 (02/17 0800) SpO2:  [98 %-100 %] 100 % (02/17 0910) Last BM Date: 07/12/19  Weight change: Filed Weights   07/07/19 1837 07/11/19 0300 07/12/19 0400  Weight: 71.6 kg 76 kg 77.5 kg    Intake/Output:   Intake/Output Summary (Last 24 hours) at 07/14/2019 0922 Last data filed at 07/14/2019 0631 Gross per 24 hour  Intake 3388.9 ml  Output 814 ml  Net 2574.9 ml     Physical Exam: General:  Appears chronically ill. No resp difficulty HEENT: normal anicteric  Neck: LIJ. supple. JVP to jaw  Carotids 2+ bilat; no bruits. No lymphadenopathy or thryomegaly appreciated. Cor: PMI nondisplaced. Regular rate & rhythm. No rubs, gallops or murmurs. Wound site ok. CT x2 Lungs: clear decreased at bases Abdomen: soft, nontender, nondistended. No hepatosplenomegaly. No bruits or masses. Good bowel sounds. Extremities: no cyanosis, clubbing, rash, R and LLE 2-3+ edema. L brachial a line Neuro: alert & oriented x 3, cranial nerves grossly intact. moves all 4 extremities w/o difficulty. Affect pleasant     Telemetry: SR 90s personally reviewed.    Labs: Basic Metabolic Panel: Recent Labs  Lab 07/09/19 0430 07/09/19 1930 07/10/19 0430 07/10/19 0430 07/11/19 0338 07/11/19 0338 07/11/19 1714 07/11/19 1714 07/12/19 0501 07/13/19 0520 07/14/19 0502  NA  --    < > 141   < > 140  --  137  --  139 141 137  K  --    < > 4.2   < > 3.4*  --  3.8  --  4.1 3.3* 4.5  CL  --    < > 105   < > 104  --  104  --  102 106 104  CO2  --     < > 27   < > 29  --  27  --  28 31 27   GLUCOSE  --    < > 70   < > 131*  --  170*  --  113* 89 141*  BUN  --    < > 7*   < > 8  --  8  --  7* 7* 11  CREATININE  --    < > 0.75   < > 0.71  --  0.58  --  0.60 0.61 0.77  CALCIUM  --    < > 7.6*   < > 7.2*   < > 7.3*   < > 7.5* 7.4* 7.6*  MG 1.2*  --  1.1*  --   --   --   --   --   --   --   --    < > = values in this interval not displayed.    Liver Function Tests: Recent Labs  Lab 07/11/19 0338 07/12/19 0501 07/13/19 0520 07/14/19 0502  AST 18 18 20 20   ALT 13 13 15 15   ALKPHOS 43 47 45 47  BILITOT 0.7 0.7 0.6 0.6  PROT 5.1* 5.0* 4.6* 4.6*  ALBUMIN 1.6* 1.8* 1.6* 1.7*   No results for input(s): LIPASE, AMYLASE in the last 168 hours. No results for input(s): AMMONIA in the last 168 hours.  CBC: Recent Labs  Lab 07/11/19 0338 07/11/19 1623 07/12/19 0501 07/13/19 0520 07/14/19 0502  WBC 10.5 9.8 8.5 6.0 13.1*  HGB 8.6* 8.6* 8.1* 6.9* 11.2*  HCT 26.6* 27.0* 25.0* 22.3* 33.9*  MCV 87.8 89.1 89.3 91.4 89.9  PLT 204 197 223 211 236    Cardiac Enzymes: No results for input(s): CKTOTAL, CKMB, CKMBINDEX, TROPONINI in the last 168 hours.  BNP: BNP (last 3 results) No results for input(s): BNP in the last 8760 hours.  ProBNP (last 3 results) No results for input(s): PROBNP in the last 8760 hours.    Other results:  Imaging: DG Chest Port 1 View  Result Date: 07/13/2019 CLINICAL DATA:  Postop empyema surgery. EXAM: PORTABLE CHEST 1 VIEW COMPARISON:  Chest CT 07/07/2019 and chest x-ray 07/12/2019 FINDINGS: Bilateral chest tubes in place. No pneumothorax is identified. Left IJ central venous catheter is stable. No persistent left-sided pleural effusion is identified. There is moderate left lower lobe atelectasis. Stable portal cardiac enlargement. IMPRESSION: 1. Bilateral chest tubes in place without pneumothorax. 2. Persistent left lower lobe atelectasis. Electronically Signed   By: Marijo Sanes M.D.   On: 07/13/2019  12:23     Medications:     Scheduled Medications: . acetaminophen  1,000 mg Oral Q6H   Or  . acetaminophen (TYLENOL) oral liquid 160 mg/5 mL  1,000 mg Oral Q6H  . bisacodyl  10 mg Oral Daily  . celecoxib  200 mg Per Tube BID  . chlorhexidine gluconate (MEDLINE KIT)  15 mL Mouth Rinse BID  . Chlorhexidine Gluconate Cloth  6 each Topical Daily  . docusate sodium  100 mg Oral Daily  . feeding supplement (ENSURE ENLIVE)  237 mL Oral TID WC  . furosemide  40 mg Intravenous BID  . levalbuterol  0.63 mg Nebulization TID  . mouth rinse  15 mL Mouth Rinse BID  . mometasone-formoterol  2 puff Inhalation BID  . montelukast  10 mg Per Tube Daily  . pantoprazole  40 mg Oral Daily  . senna-docusate  1 tablet Oral QHS    Infusions: . sodium chloride 50 mL/hr at 07/14/19 0400  . sodium chloride    . sodium chloride    . amiodarone 30 mg/hr (07/14/19 0400)  . heparin 1,150 Units/hr (07/14/19 0400)  . norepinephrine (LEVOPHED) Adult infusion Stopped (07/11/19 0802)  . 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 Wound vac in place -S/P 2/16Greater Omental Flap Closure today CT x2  - GS with +++GPC. Culture strep intermedius - on Vancomycin.  - TCTS managing.  2. PAH - very mild by cath PVR < 1.0 - Sildenafil off with low BP.  - Restart once improved.   3. PAF -Back in NSR today.  Continue amio 30 mg per hour + heparin drip.   4. Volume overload -LVEF normal RV normal.  - Volume overloaded.  - Start lasix 40 mg IV lasix twice a day.  -Continue ted hose.  5. Hypokalemia/Hypomagnesemia K 4.5   6. Severe protein calorie malnutrition - will get Nutrition consult  7.  Anemia  HGb 11.2  - Repeat CBC pending.   Set up CVP. Daily weights.   Length of Stay: [place] NP-C  07/14/2019, 9:22 AM  Advanced Heart Failure Team Pager [phone] (M-F; 7a - 4p)  Please contact [facility] for night-coverage after hours (4p -7a ) and weekends on [email or web address]  Patient seen and examined with the above-signed Advanced Practice Provider and/or Housestaff. I personally reviewed laboratory data, imaging studies and relevant notes. I independently examined the patient and formulated the important aspects of the plan. I have edited the note to reflect any of my changes or salient points. I have personally discussed the plan with the patient and/or family.  Now s/p omental flap. Feels ok. Volume status up. Agree with IV lasix.   Empyema culture + strep intermedius. Have d/w PharmD. Will consider narrowing abx to ceftriaxone.   Glori Bickers, MD  5:22 PM

## 2019-07-14 NOTE — Progress Notes (Signed)
Physical Therapy Treatment Patient Details Name: Connie Jarvis MRN: [id number] DOB: June 17, 1940 Today's Date: 07/14/2019    History of Present Illness 79 y.o. female with medical history significant of O2 dependent COPD. Recently seen by GS for left upper chest wall lesion. S/p I&D and placed on Abx. Patient was to have further debridement of lesion but c/o SOB.  Seen in f/u by pulmonology. Patient found to have a large fungating mass, 8x20cm draining yellow fluid. CT scan of the chest revealed a large empyema measuring 13.4x12.5 cm with compressive atelectasis. Pt underwent thoracotomy with empyema drainage on 2/11.    PT Comments    Pt tolerated treatment well, demonstrating continued assistance requirements during transfers and bed mobility due to general weakness. Pt with noted knee buckling during attempted weight shifts to initiate gait training and pt with reduced TKE on bilateral knees during standing, pt attributes this to arthritis and weakness. Pt will benefit from continued acute PT POC to improve activity tolerance and reduce falls risk.   Follow Up Recommendations  SNF;Supervision/Assistance - 24 hour     Equipment Recommendations  (defer to post-acute setting)    Recommendations for Other Services       Precautions / Restrictions Precautions Precautions: Fall;Other (comment) Precaution Comments: wound vac L thorax, chest tube x 2    Mobility  Bed Mobility Overal bed mobility: Needs Assistance Bed Mobility: Supine to Sit     Supine to sit: Mod assist;HOB elevated        Transfers Overall transfer level: Needs assistance Equipment used: Rolling walker (2 wheeled) Transfers: Sit to/from Stand Sit to Stand: Mod assist;Min assist(modA w/o RW from bed, minA with RW from recliner) Stand pivot transfers: Mod assist          Ambulation/Gait Ambulation/Gait assistance: Min assist Gait Distance (Feet): 0 Feet Assistive device: Rolling walker (2 wheeled) Gait  Pattern/deviations: Step-to pattern Gait velocity: 0 Gait velocity interpretation: <1.31 ft/sec, indicative of household ambulator General Gait Details: pt attempting to weight shift to clear each foot individually. PT notes L knee buckling with attempts to step with R foot   Stairs             Wheelchair Mobility    Modified Rankin (Stroke Patients Only)       Balance Overall balance assessment: Needs assistance Sitting-balance support: Bilateral upper extremity supported;Feet supported Sitting balance-Leahy Scale: Fair Sitting balance - Comments: close supervision at edge of bed   Standing balance support: Bilateral upper extremity supported Standing balance-Leahy Scale: Poor Standing balance comment: minA with bUE support of RW                            Cognition Arousal/Alertness: Awake/alert Behavior During Therapy: WFL for tasks assessed/performed Overall Cognitive Status: Within Functional Limits for tasks assessed                                        Exercises      General Comments General comments (skin integrity, edema, etc.): VSS on 2L       Pertinent Vitals/Pain Pain Assessment: Faces Faces Pain Scale: Hurts even more Pain Location: abdomen Pain Descriptors / Indicators: Grimacing Pain Intervention(s): Limited activity within patient's tolerance    Home Living  Prior Function            PT Goals (current goals can now be found in the care plan section) Acute Rehab PT Goals Patient Stated Goal: To go to rehab and get stronger Progress towards PT goals: Progressing toward goals    Frequency    Min 3X/week      PT Plan Current plan remains appropriate    Co-evaluation              AM-PAC PT "6 Clicks" Mobility   Outcome Measure  Help needed turning from your back to your side while in a flat bed without using bedrails?: A Little Help needed moving from lying on  your back to sitting on the side of a flat bed without using bedrails?: A Lot Help needed moving to and from a bed to a chair (including a wheelchair)?: A Lot Help needed standing up from a chair using your arms (e.g., wheelchair or bedside chair)?: A Little Help needed to walk in hospital room?: A Lot Help needed climbing 3-5 steps with a railing? : Total 6 Click Score: 13    End of Session Equipment Utilized During Treatment: Oxygen Activity Tolerance: Patient tolerated treatment well Patient left: in chair;with call bell/phone within reach;with nursing/sitter in room Nurse Communication: Mobility status PT Visit Diagnosis: Unsteadiness on feet (R26.81);Muscle weakness (generalized) (M62.81)     Time: [id number] PT Time Calculation (min) (ACUTE ONLY): 41 min  Charges:  $Therapeutic Activity: 38-52 mins                    Zenaida Niece, PT, DPT Acute Rehabilitation Pager: [phone]    Zenaida Niece 07/14/2019, 2:37 PM

## 2019-07-14 NOTE — Progress Notes (Signed)
[place] for Heparin Indication: atrial fibrillation  Allergies  Allergen Reactions  . Codeine Nausea And Vomiting  . Gluten Meal Diarrhea    Abdominal pain and bloating  . Lactose Diarrhea    Bloating and abdominal pain  . Neosporin [Neomycin-Bacitracin Zn-Polymyx]   . Other     Pain medication pt. Cannot remember the name.    Patient Measurements: Height: 5' (152.4 cm) Weight: 179 lb 14.3 oz (81.6 kg) IBW/kg (Calculated) : 45.5 Heparin Dosing Weight: 61kg  Vital Signs: Temp: 97.7 F (36.5 C) (02/17 1002) Temp Source: Oral (02/17 1002) BP: 128/65 (02/17 1200) Pulse Rate: 86 (02/17 1002)  Labs: Recent Labs    07/12/19 0501 07/12/19 1500 07/13/19 0520 07/14/19 0251 07/14/19 0502 07/14/19 1200  HGB 8.1*  --  6.9*  --  11.2*  --   HCT 25.0*  --  22.3*  --  33.9*  --   PLT 223  --  211  --  236  --   APTT  --   --  184*  --   --   --   LABPROT  --   --  15.6*  --   --   --   INR  --   --  1.2  --   --   --   HEPARINUNFRC 0.46   < > 0.42 0.22*  --  0.29*  CREATININE 0.60  --  0.61  --  0.77  --    < > = values in this interval not displayed.    Estimated Creatinine Clearance: 56.6 mL/min (by C-G formula based on SCr of 0.77 mg/dL).  Assessment: 79 year old female s/p thoracotomy and drainage of empyema. Currently on heparin for in/out AFib.   Heparin level just below goal at 0.29 this afternoon. No current issues per RN.   Goal of Therapy:  Heparin level 0.3-0.5 units/ml Aim for ~0.3 Monitor platelets by anticoagulation protocol: Yes   Plan:  -Increase heparin to 1200 units/hr -Monitor HL, CBC, and for s/sx of bleeding   Erin Hearing PharmD., BCPS Clinical Pharmacist 07/14/2019 1:52 PM

## 2019-07-14 NOTE — Progress Notes (Signed)
[place] for Heparin Indication: atrial fibrillation  Allergies  Allergen Reactions  . Codeine Nausea And Vomiting  . Gluten Meal Diarrhea    Abdominal pain and bloating  . Lactose Diarrhea    Bloating and abdominal pain  . Neosporin [Neomycin-Bacitracin Zn-Polymyx]   . Other     Pain medication pt. Cannot remember the name.    Patient Measurements: Height: 5' (152.4 cm) Weight: 170 lb 13.7 oz (77.5 kg) IBW/kg (Calculated) : 45.5 Heparin Dosing Weight: 61kg  Vital Signs: Temp: 98.2 F (36.8 C) (02/17 0300) Temp Source: Oral (02/17 0300) BP: 130/63 (02/17 0300) Pulse Rate: 93 (02/17 0300)  Labs: Recent Labs    07/11/19 0338 07/11/19 1623 07/11/19 1714 07/12/19 0501 07/12/19 0501 07/12/19 1500 07/13/19 0520 07/14/19 0251  HGB   < > 8.6*  --  8.1*  --   --  6.9*  --   HCT  --  27.0*  --  25.0*  --   --  22.3*  --   PLT  --  197  --  223  --   --  211  --   APTT  --   --   --   --   --   --  184*  --   LABPROT  --   --   --   --   --   --  15.6*  --   INR  --   --   --   --   --   --  1.2  --   HEPARINUNFRC   < >  --   --  0.46   < > 0.32 0.42 0.22*  CREATININE   < >  --  0.58 0.60  --   --  0.61  --    < > = values in this interval not displayed.    Estimated Creatinine Clearance: 55.1 mL/min (by C-G formula based on SCr of 0.61 mg/dL).  Assessment: 79 year old female s/p thoracotomy and drainage of empyema. Currently on heparin for in/out AFib.   Patient now s/p flap closure and heparin IV was stopped this morning for procedure. Heparin level was on higher end of therapeutic at 0.42 after decreasing drip rate to 1100 units/hr yesterday. Hgb this AM low at 6.9, given 2u PRBC. Plts wnl. EBL during procedure <50 ml. Restarting heparin 6-hrs after end of procedure.   2/17 AM update:  Heparin level low after re-start No current issues per RN  Goal of Therapy:  Heparin level 0.3-0.5 units/ml Aim for ~0.3 Monitor platelets  by anticoagulation protocol: Yes   Plan:  -Inc heparin to 1150 units/hr -Re-check heparin level at 1200 -Monitor HL, CBC, and for s/sx of bleeding    Narda Bonds, PharmD, BCPS Clinical Pharmacist Phone: [phone]

## 2019-07-15 ENCOUNTER — Inpatient Hospital Stay (HOSPITAL_COMMUNITY): Payer: Medicare Other

## 2019-07-15 DIAGNOSIS — L8990 Pressure ulcer of unspecified site, unspecified stage: Secondary | ICD-10-CM | POA: Insufficient documentation

## 2019-07-15 LAB — COMPREHENSIVE METABOLIC PANEL
ALT: 14 U/L (ref 0–44)
AST: 15 U/L (ref 15–41)
Albumin: 1.7 g/dL — ABNORMAL LOW (ref 3.5–5.0)
Alkaline Phosphatase: 46 U/L (ref 38–126)
Anion gap: 7 (ref 5–15)
BUN: 13 mg/dL (ref 8–23)
CO2: 29 mmol/L (ref 22–32)
Calcium: 7.5 mg/dL — ABNORMAL LOW (ref 8.9–10.3)
Chloride: 101 mmol/L (ref 98–111)
Creatinine, Ser: 0.77 mg/dL (ref 0.44–1.00)
GFR calc Af Amer: >60 mL/min (ref >60)
GFR calc non Af Amer: >60 mL/min (ref >60)
Glucose, Bld: 93 mg/dL (ref 70–99)
Potassium: 3.9 mmol/L (ref 3.5–5.1)
Sodium: 137 mmol/L (ref 135–145)
Total Bilirubin: 0.6 mg/dL (ref 0.3–1.2)
Total Protein: 4.3 g/dL — ABNORMAL LOW (ref 6.5–8.1)

## 2019-07-15 LAB — CBC
HCT: 31.2 % — ABNORMAL LOW (ref 36.0–46.0)
Hemoglobin: 10.1 g/dL — ABNORMAL LOW (ref 12.0–15.0)
MCH: 29.4 pg (ref 26.0–34.0)
MCHC: 32.4 g/dL (ref 30.0–36.0)
MCV: 91 fL (ref 80.0–100.0)
Platelets: 217 10*3/uL (ref 150–400)
RBC: 3.43 MIL/uL — ABNORMAL LOW (ref 3.87–5.11)
RDW: 18.2 % — ABNORMAL HIGH (ref 11.5–15.5)
WBC: 9.7 10*3/uL (ref 4.0–10.5)
nRBC: 0 % (ref 0.0–0.2)

## 2019-07-15 LAB — HEPARIN LEVEL (UNFRACTIONATED)
Heparin Unfractionated: 0.77 IU/mL — ABNORMAL HIGH (ref 0.30–0.70)
Heparin Unfractionated: 0.49 IU/mL (ref 0.30–0.70)

## 2019-07-15 MED ORDER — FUROSEMIDE 10 MG/ML IJ SOLN
80.0000 mg | Freq: Two times a day (BID) | INTRAMUSCULAR | Status: AC
  Administered 2019-07-15 – 2019-07-16 (×3): 80 mg via INTRAVENOUS
  Filled 2019-07-15 (×3): qty 8

## 2019-07-15 MED ORDER — POTASSIUM CHLORIDE CRYS ER 20 MEQ PO TBCR
40.0000 meq | EXTENDED_RELEASE_TABLET | Freq: Once | ORAL | Status: AC
  Administered 2019-07-15: 40 meq via ORAL
  Filled 2019-07-15: qty 2

## 2019-07-15 MED ORDER — GERHARDT'S BUTT CREAM
TOPICAL_CREAM | Freq: Two times a day (BID) | CUTANEOUS | Status: DC
  Administered 2019-07-17 – 2019-07-22 (×3): 1 via TOPICAL
  Filled 2019-07-15 (×2): qty 1

## 2019-07-15 MED ORDER — LEVALBUTEROL HCL 0.63 MG/3ML IN NEBU
0.6300 mg | INHALATION_SOLUTION | Freq: Two times a day (BID) | RESPIRATORY_TRACT | Status: DC
  Administered 2019-07-15 – 2019-07-17 (×4): 0.63 mg via RESPIRATORY_TRACT
  Filled 2019-07-15 (×4): qty 3

## 2019-07-15 NOTE — Progress Notes (Addendum)
2 Days Post-Op Procedure(s) (LRB): GREATER OMENTAL FLAP CLOSURE (Left) CLOSURE OF THORACOTOMY (Left) APPLICATION OF WOUND VAC USING PREVENA DRESSING (Left) Chest Tube Insertion (Right) Vascular Assessment With Spy Elite (Left) Subjective: Awake and alert, feeling stronger. OOB to chair yesterday but required a lot of PT assistance. Says she is swallowing pills with sips of water. No BM yet.  She states that she wants to be designated "DNR" as of today.   Objective: Vital signs in last 24 hours: Temp:  [97.4 F (36.3 C)-97.9 F (36.6 C)] 97.4 F (36.3 C) (02/18 0400) Pulse Rate:  [78-102] 79 (02/18 0400) Cardiac Rhythm: Normal sinus rhythm (02/17 1900) Resp:  [13-21] 18 (02/18 0400) BP: (110-128)/(48-68) 110/56 (02/18 0400) SpO2:  [96 %-100 %] 100 % (02/18 0400) Weight:  [81.6 kg] 81.6 kg (02/17 1200)  Intake/Output from previous day: 02/17 0701 - 02/18 0700 In: 1780.7 [I.V.:1601.4; IV Piggyback:179.2] Out: 2670 [Urine:2000; Chest Tube:670] Intake/Output this shift No intake/output data recorded.   Physical Exam: General appearance: alert, cooperative and no distress Neurologic: no deficits Heart: NSR with frequent PAC's Lungs: Breath sounds are clear, 215ml serous drainage from the left pleural tube and 356ml from right pleural tubes over past 24 hours. No air leak on either side.  Abdomen: soft, normal bowel sounds today, expected peri-incisional tenderness Extremities: All warm and well perfused.   Wound: The left chest wound is covered with a compressed Prevena dressing. An Aquacel bandage covers the midline abdominal incision and is dry.  The dressings around the bilateral pleural drains dry.    Lab Results: Recent Labs    07/14/19 0502 07/15/19 0425  WBC 13.1* 9.7  HGB 11.2* 10.1*  HCT 33.9* 31.2*  PLT 236 217   BMET:  Recent Labs    07/14/19 0502 07/15/19 0425  NA 137 137  K 4.5 3.9  CL 104 101  CO2 27 29  GLUCOSE 141* 93  BUN 11 13  CREATININE 0.77  0.77  CALCIUM 7.6* 7.5*    PT/INR:  Recent Labs    07/13/19 0520  LABPROT 15.6*  INR 1.2   ABG    Component Value Date/Time   PHART 7.380 07/14/2019 0436   HCO3 27.9 07/14/2019 0436   TCO2 30 07/09/2019 0427   O2SAT 98.2 07/14/2019 0436   CBG (last 3)  No results for input(s): GLUCAP in the last 72 hours.  Assessment/Plan: S/P Procedure(s) (LRB): GREATER OMENTAL FLAP CLOSURE (Left) CLOSURE OF THORACOTOMY (Left) APPLICATION OF WOUND VAC USING PREVENA DRESSING (Left) Chest Tube Insertion (Right) Vascular Assessment With Spy Elite (Left)  78yof with left empyema and subsequent erosion through the left lateral chest wall.   -POD-7 left thoracotomy for drainage of empyema, debridement of the left chest wall wound, and placement of a wound vac. The vac was changed in the OR 3 days later.  Initial wound Cx + for Strept intermedius (PCN-sensitive).     -POD-2 closure of the open left chest wound with greater omental flap and drainage of a right pleural effusion and chest tube placement. The chest wound is covered with a compressed Prevena dressing that will remain in place for several more days. Changed ABX to PCN-G continuous infusion. Mobilize. Will attempt to advance diet.   -Paroxysmal atrial fibrillation- Currently back to SR on IV amiodarone at 0.5mg /min. Will convert to PO amiodarone when tolerating PO's better. Continue heparin managed by pharmacy but will eventually transition to oral anticoagulation.   -Volume overload-  Needs to diurese further. Will defer diuretic  mgt to  the heart failure team.  -Expected acute blood loss anemia- Hct 31%, monitor.  -Pt requesting DNR status stating "it's my decision". I have no reason to doubt her competence to make this decision.  Will place order for DNR status.    LOS: 8 days    Antony Odea, [place] [phone] 07/15/2019 Pt seen and examined; agree with note. Appreciate Dr. Clayborne Dana continued involvement. Goal is  in-patient rehab once more fully recovered from surgery. Zak Gondek Z. Orvan Seen, [place]

## 2019-07-15 NOTE — Progress Notes (Signed)
Inpatient Rehabilitation Admissions Coordinator  I have requested Dr. Orvan Seen for CIR consult due to pt's medical complexity , to be considered for CIR admit.  Danne Baxter, RN, MSN Rehab Admissions Coordinator [phone] 07/15/2019 11:42 AM

## 2019-07-15 NOTE — Progress Notes (Signed)
ANTICOAGULATION CONSULT NOTE - Follow Up Consult  Pharmacy Consult for heparin Indication: atrial fibrillation  Labs: Recent Labs    07/13/19 0520 07/13/19 0520 07/14/19 0251 07/14/19 0502 07/14/19 1200 07/15/19 0425  HGB 6.9*   < >  --  11.2*  --  10.1*  HCT 22.3*  --   --  33.9*  --  31.2*  PLT 211  --   --  236  --  217  APTT 184*  --   --   --   --   --   LABPROT 15.6*  --   --   --   --   --   INR 1.2  --   --   --   --   --   HEPARINUNFRC 0.42   < > 0.22*  --  0.29* 0.77*  CREATININE 0.61  --   --  0.77  --  0.77   < > = values in this interval not displayed.    Assessment: 79yo female supratherapeutic on heparin after rate changes; no gtt issues or signs of bleeding per RN other than some blood-tinged fluid from chest tube.  Goal of Therapy:  Heparin level ~0.3 units/ml   Plan:  Will decrease heparin gtt by 3 units/kgABW/hr to 1000 units/hr and check level in 8 hours.    Wynona Neat, PharmD, BCPS  07/15/2019,6:04 AM

## 2019-07-15 NOTE — Evaluation (Signed)
Clinical/Bedside Swallow Evaluation Patient Details  Name: Connie Jarvis MRN: [id number] Date of Birth: 28-Jan-1941  Today's Date: 07/15/2019 Time: SLP Start Time (ACUTE ONLY): 0845 SLP Stop Time (ACUTE ONLY): 0900 SLP Time Calculation (min) (ACUTE ONLY): 15 min  Past Medical History:  Past Medical History:  Diagnosis Date  . Allergy   . Anemia   . Anxiety   . Arthritis   . Cataract    pt. not sure which eye early stage  . Depression   . GERD (gastroesophageal reflux disease)   . Hypertension    Past Surgical History:  Past Surgical History:  Procedure Laterality Date  . APPLICATION OF WOUND VAC Left 07/08/2019   Procedure: APPLICATION OF WOUND VAC;  Surgeon: Wonda Olds, MD;  Location: [facility] OR;  Service: Thoracic;  Laterality: Left;  . APPLICATION OF WOUND VAC Left 07/11/2019   Procedure: WOUND VAC CHANGE AND WOUND IRRIGATION AND SHARP DEBRIDEMENT;  Surgeon: Wonda Olds, MD;  Location: [place];  Service: Thoracic;  Laterality: Left;  . APPLICATION OF WOUND VAC Left 07/13/2019   Procedure: APPLICATION OF WOUND VAC USING PREVENA DRESSING;  Surgeon: Wonda Olds, MD;  Location: [facility] OR;  Service: Thoracic;  Laterality: Left;  . CENTRAL LINE INSERTION Left 07/08/2019   Procedure: CENTRAL LINE INSERTION;  Surgeon: Jolaine Artist, MD;  Location: [place] [facility];  Service: Cardiovascular;  Laterality: Left;  . CHEST TUBE INSERTION Right 07/13/2019   Procedure: Chest Tube Insertion;  Surgeon: Wonda Olds, MD;  Location: [facility] OR;  Service: Thoracic;  Laterality: Right;  . COLONOSCOPY    . EMPYEMA DRAINAGE Left 07/08/2019   Procedure: EMPYEMA DRAINAGE;  Surgeon: Wonda Olds, MD;  Location: [facility];  Service: Thoracic;  Laterality: Left;  . GREATER OMENTAL FLAP CLOSURE Left 07/13/2019   Procedure: GREATER OMENTAL FLAP CLOSURE;  Surgeon: Wonda Olds, MD;  Location: [facility];  Service: Thoracic;  Laterality: Left;  . MOUTH SURGERY    . RIGHT/LEFT HEART CATH AND CORONARY  ANGIOGRAPHY N/A 07/08/2019   Procedure: RIGHT/LEFT HEART CATH AND CORONARY ANGIOGRAPHY;  Surgeon: Jolaine Artist, MD;  Location: [place] [facility];  Service: Cardiovascular;  Laterality: N/A;  . THORACOTOMY Left 07/08/2019   Procedure: THORACOTOMY MAJOR;  Surgeon: Wonda Olds, MD;  Location: [facility];  Service: Thoracic;  Laterality: Left;  . THORACOTOMY Left 07/13/2019   Procedure: CLOSURE OF THORACOTOMY;  Surgeon: Wonda Olds, MD;  Location: [facility];  Service: Thoracic;  Laterality: Left;  . TONSILLECTOMY    . TUBAL LIGATION    . VASCULAR ASSESSMENT WITH SPY ELITE Left 07/13/2019   Procedure: Vascular Assessment With Spy Elite;  Surgeon: Wonda Olds, MD;  Location: [facility] OR;  Service: Thoracic;  Laterality: Left;   HPI:  79 yo female adm to [name] with empyema, fungating mass s/p surgical intervention on 2/12 with drain placement, thoractomy.  Pt also for OR 2/14.  Seen by SLP on 2/13, started on regualr diet and thin liquids. Had another procedure on 2/16 and was intubated. Made NPO on 2/17 and SLP reordered. She has h/o HTN, pulmonary HTN, COPD, GERD and takes Prevaid for reflux at home.    Assessment / Plan / Recommendation Clinical Impression  Pt demosntrates normal swallowing ability without signs of aspiration. Denies difficulty since second procedure. Reinforced basic aspiration precautions. Will initiate a regular gluten free diet and sign off.       Aspiration Risk       Diet  Recommendation Thin liquid;Regular   Liquid Administration via: Cup;Straw Medication Administration: Whole meds with liquid Supervision: Patient able to self feed Compensations: Slow rate;Small sips/bites Postural Changes: Other (Comment)(tilted upright, discussed with RN)    Other  Recommendations Oral Care Recommendations: Oral care BID   Follow up Recommendations        Frequency and Duration            Prognosis        Swallow Study   General HPI: 79 yo female adm to [name] with  empyema, fungating mass s/p surgical intervention on 2/12 with drain placement, thoractomy.  Pt also for OR 2/14.  Seen by SLP on 2/13, started on regualr diet and thin liquids. Had another procedure on 2/16 and was intubated. Made NPO on 2/17 and SLP reordered. She has h/o HTN, pulmonary HTN, COPD, GERD and takes Prevaid for reflux at home.  Type of Study: Bedside Swallow Evaluation Previous Swallow Assessment: see hpi Diet Prior to this Study: NPO Temperature Spikes Noted: No Respiratory Status: Nasal cannula History of Recent Intubation: Yes Length of Intubations (days): 2 days Date extubated: 07/14/19 Behavior/Cognition: Alert;Cooperative;Pleasant mood Oral Cavity Assessment: Within Functional Limits Oral Care Completed by SLP: No Oral Cavity - Dentition: Adequate natural dentition Vision: Functional for self-feeding Self-Feeding Abilities: Able to feed self Patient Positioning: Partially reclined Baseline Vocal Quality: Normal Volitional Cough: Weak(self limiting due to pain) Volitional Swallow: Able to elicit    Oral/Motor/Sensory Function     Ice Chips Ice chips: Within functional limits   Thin Liquid Thin Liquid: Within functional limits    Nectar Thick Nectar Thick Liquid: Not tested   Honey Thick Honey Thick Liquid: Not tested   Puree Puree: Within functional limits   Solid     Solid: Not tested(Pt gluten free)     Connie Baltimore, MA CCC-SLP  Acute Rehabilitation Services Pager [phone] Office [phone]  Connie Jarvis 07/15/2019,10:00 AM

## 2019-07-15 NOTE — Progress Notes (Addendum)
Advanced Heart Failure Rounding Note   Subjective:    S/p thoracotomy on 2/11.  S/P Greater Omental Flap Closure. Wound vac in place.    Pleural fluid GS +++GPC. Cx back Strep intermedius--> on penicillin  In NSR. On amio 30 mg per hour.   Responding well to IV Lasix. -3.8L out yesterday. CVP 8-9  Sitting up in bed. Husband at bedside. Feels better today. Remains on [place]. Appetite is poor.    Objective:   Weight Range:  Vital Signs:   Temp:  [97.4 F (36.3 C)-97.7 F (36.5 C)] 97.4 F (36.3 C) (02/18 0400) Pulse Rate:  [79-102] 79 (02/18 0400) Resp:  [18-21] 18 (02/18 0400) BP: (110-128)/(48-68) 110/56 (02/18 0400) SpO2:  [96 %-100 %] 100 % (02/18 0824) Weight:  [81.6 kg] 81.6 kg (02/17 1200) Last BM Date: 07/12/19  Weight change: Filed Weights   07/11/19 0300 07/12/19 0400 07/14/19 1200  Weight: 76 kg 77.5 kg 81.6 kg    Intake/Output:   Intake/Output Summary (Last 24 hours) at 07/15/2019 0846 Last data filed at 07/15/2019 0606 Gross per 24 hour  Intake 1780.67 ml  Output 2670 ml  Net -889.33 ml     Physical Exam: CVP 8-9 General: thin/fatigue appearing WF No resp difficulty HEENT: normal anicteric  Neck: supple. JVP 7-8 cm. Carotids 2+ bilat; no bruits. No lymphadenopathy or thryomegaly appreciated. Cor: PMI nondisplaced. Regular rate & rhythm. No rubs, gallops or murmurs. CT x2  + wound vac midline incision dressing clean/dry  Lungs: Clear  Abdomen: soft, nontender, nondistended. No hepatosplenomegaly. No bruits or masses. Good bowel sounds. Extremities: no cyanosis, clubbing, rash, 2+ bilateral LEE edema up to knees  Neuro: alert & oriented x 3, cranial nerves grossly intact. moves all 4 extremities w/o difficulty. Affect pleasant    Telemetry: NSR SR 80-90s personally reviewed.    Labs: Basic Metabolic Panel: Recent Labs  Lab 07/09/19 0430 07/09/19 1930 07/10/19 0430 07/11/19 0338 07/11/19 1714 07/11/19 1714 07/12/19 0501 07/12/19 0501  07/13/19 0520 07/14/19 0502 07/15/19 0425  NA  --    < > 141   < > 137  --  139  --  141 137 137  K  --    < > 4.2   < > 3.8  --  4.1  --  3.3* 4.5 3.9  CL  --    < > 105   < > 104  --  102  --  106 104 101  CO2  --    < > 27   < > 27  --  28  --  31 27 29   GLUCOSE  --    < > 70   < > 170*  --  113*  --  89 141* 93  BUN  --    < > 7*   < > 8  --  7*  --  7* 11 13  CREATININE  --    < > 0.75   < > 0.58  --  0.60  --  0.61 0.77 0.77  CALCIUM  --    < > 7.6*   < > 7.3*   < > 7.5*   < > 7.4* 7.6* 7.5*  MG 1.2*  --  1.1*  --   --   --   --   --   --   --   --    < > = values in this interval not displayed.    Liver Function Tests: Recent  Labs  Lab 07/11/19 0338 07/12/19 0501 07/13/19 0520 07/14/19 0502 07/15/19 0425  AST 18 18 20 20 15   ALT 13 13 15 15 14   ALKPHOS 43 47 45 47 46  BILITOT 0.7 0.7 0.6 0.6 0.6  PROT 5.1* 5.0* 4.6* 4.6* 4.3*  ALBUMIN 1.6* 1.8* 1.6* 1.7* 1.7*   No results for input(s): LIPASE, AMYLASE in the last 168 hours. No results for input(s): AMMONIA in the last 168 hours.  CBC: Recent Labs  Lab 07/11/19 1623 07/12/19 0501 07/13/19 0520 07/14/19 0502 07/15/19 0425  WBC 9.8 8.5 6.0 13.1* 9.7  HGB 8.6* 8.1* 6.9* 11.2* 10.1*  HCT 27.0* 25.0* 22.3* 33.9* 31.2*  MCV 89.1 89.3 91.4 89.9 91.0  PLT 197 223 211 236 217    Cardiac Enzymes: No results for input(s): CKTOTAL, CKMB, CKMBINDEX, TROPONINI in the last 168 hours.  BNP: BNP (last 3 results) No results for input(s): BNP in the last 8760 hours.  ProBNP (last 3 results) No results for input(s): PROBNP in the last 8760 hours.    Other results:  Imaging: DG CHEST PORT 1 VIEW  Result Date: 07/15/2019 CLINICAL DATA:  Postop check EXAM: PORTABLE CHEST 1 VIEW COMPARISON:  Two days ago FINDINGS: Chest tubes in place. Left IJ line with tip at the upper cavoatrial junction. Continued retrocardiac opacification where there was a thick walled pleural effusions seen by prior CT. No visible pneumothorax.  IMPRESSION: Stable hardware positioning and residual pleuroparenchymal opacity at the left base. Electronically Signed   By: Monte Fantasia M.D.   On: 07/15/2019 07:58   DG Chest Port 1 View  Result Date: 07/13/2019 CLINICAL DATA:  Postop empyema surgery. EXAM: PORTABLE CHEST 1 VIEW COMPARISON:  Chest CT 07/07/2019 and chest x-ray 07/12/2019 FINDINGS: Bilateral chest tubes in place. No pneumothorax is identified. Left IJ central venous catheter is stable. No persistent left-sided pleural effusion is identified. There is moderate left lower lobe atelectasis. Stable portal cardiac enlargement. IMPRESSION: 1. Bilateral chest tubes in place without pneumothorax. 2. Persistent left lower lobe atelectasis. Electronically Signed   By: Marijo Sanes M.D.   On: 07/13/2019 12:23     Medications:     Scheduled Medications: . acetaminophen  1,000 mg Oral Q6H   Or  . acetaminophen (TYLENOL) oral liquid 160 mg/5 mL  1,000 mg Oral Q6H  . bisacodyl  10 mg Oral Daily  . celecoxib  200 mg Per Tube BID  . chlorhexidine gluconate (MEDLINE KIT)  15 mL Mouth Rinse BID  . Chlorhexidine Gluconate Cloth  6 each Topical Daily  . docusate sodium  100 mg Oral Daily  . feeding supplement (ENSURE ENLIVE)  237 mL Oral TID WC  . furosemide  40 mg Intravenous BID  . levalbuterol  0.63 mg Nebulization BID  . mouth rinse  15 mL Mouth Rinse BID  . mometasone-formoterol  2 puff Inhalation BID  . montelukast  10 mg Per Tube Daily  . pantoprazole  40 mg Oral Daily  . senna-docusate  1 tablet Oral QHS    Infusions: . sodium chloride 50 mL/hr at 07/14/19 1033  . sodium chloride    . sodium chloride    . amiodarone 30 mg/hr (07/14/19 1324)  . heparin 1,000 Units/hr (07/15/19 0606)  . norepinephrine (LEVOPHED) Adult infusion Stopped (07/11/19 0802)  . 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 Wound vac in place -S/P 2/16 Greater Omental Flap Closure CT x2 + wound vac - GS with +++GPC. Culture strep intermedius--> on penicillin - TCTS managing.  2. PAH - very mild by cath PVR < 1.0 - Sildenafil off with low BP.  - Restart once improved.   3. PAF -Maintaining NSR.  Continue amio 30 mg per hour + heparin drip.   4. Volume overload -LVEF normal RV normal.  - Improving w/ diuresis but still volume overloaded. Renal function stable. Continue IV Lasix 80 mg bid -Continue ted hose.  5. Hypokalemia/Hypomagnesemia - K 3.6 today  - Will give supp w/ Lasix  6. Severe protein calorie malnutrition -Albumin 1.7 - Passed speech eval.  - will get Nutrition consult  7. Anemia  - Hgb 10.3 - monitor on heparin  8. Severe Deconditioning - PT following. Recommending SNF.   9. Hypomagnesemia  - Mg 1.5 - supp  MgSO4 x 1    Length of Stay: 8  Avi Kerschner PA-C 07/15/2019, 8:46 AM  Advanced Heart Failure Team Pager [phone] (M-F; [place])  Please contact [facility] for night-coverage after hours (4p -7a ) and weekends on [email or web address]

## 2019-07-15 NOTE — Progress Notes (Signed)
[place] for Heparin Indication: atrial fibrillation  Allergies  Allergen Reactions  . Codeine Nausea And Vomiting  . Gluten Meal Diarrhea    Abdominal pain and bloating  . Lactose Diarrhea    Bloating and abdominal pain  . Neosporin [Neomycin-Bacitracin Zn-Polymyx]   . Other     Pain medication pt. Cannot remember the name.    Patient Measurements: Height: 5' (152.4 cm) Weight: 185 lb 10 oz (84.2 kg) IBW/kg (Calculated) : 45.5 Heparin Dosing Weight: 61kg  Vital Signs: Temp: 97.7 F (36.5 C) (02/18 1550) Temp Source: Oral (02/18 1550) BP: 113/53 (02/18 1550) Pulse Rate: 81 (02/18 1550)  Labs: Recent Labs    07/13/19 0520 07/14/19 0251 07/14/19 0502 07/14/19 1200 07/15/19 0425 07/15/19 1650  HGB 6.9*  --  11.2*  --  10.1*  --   HCT 22.3*  --  33.9*  --  31.2*  --   PLT 211  --  236  --  217  --   APTT 184*  --   --   --   --   --   LABPROT 15.6*  --   --   --   --   --   INR 1.2  --   --   --   --   --   HEPARINUNFRC 0.42   < >  --  0.29* 0.77* 0.49  CREATININE 0.61  --  0.77  --  0.77  --    < > = values in this interval not displayed.    Estimated Creatinine Clearance: 57.6 mL/min (by C-G formula based on SCr of 0.77 mg/dL).  Assessment: 79 year old female s/p thoracotomy and drainage of empyema. Currently on heparin for in/out AFib.   Heparin level is therapeutic for goal at 0.49 but at top of range on 1000 units/hr.  No current issues per RN with infusion and no bleeding observed. CBC is stable.   Goal of Therapy:  Heparin level 0.3-0.5 units/ml Aim for ~0.3 Monitor platelets by anticoagulation protocol: Yes   Plan:  Will decrease slightly to 950 units/hr to keep in range and closer to low end.  Follow-up daily levels and CBC.   Sloan Leiter, PharmD, BCPS, BCCCP Clinical Pharmacist Please refer to [facility] for [place] numbers 07/15/2019 5:44 PM

## 2019-07-15 NOTE — Progress Notes (Signed)
Physical Therapy Treatment Patient Details Name: Connie Jarvis MRN: [id number] DOB: 04/13/1941 Today's Date: 07/15/2019    History of Present Illness 79 y.o. female with medical history significant of O2 dependent COPD. Recently seen by GS for left upper chest wall lesion. S/p I&D and placed on Abx. Patient was to have further debridement of lesion but c/o SOB.  Seen in f/u by pulmonology. Patient found to have a large fungating mass, 8x20cm draining yellow fluid. CT scan of the chest revealed a large empyema measuring 13.4x12.5 cm with compressive atelectasis. Pt underwent thoracotomy with empyema drainage on 2/11.    PT Comments    Pt tolerated treatment well despite pain in abdomen and chest tube sites. Pt continues to require physical assistance to perform all functional mobility, however demonstrates improved transfer quality and LE power during session. Pt with no instances of knee buckling this session and able to ambulate slowly and steadily for very short distances. Pt will continue to benefit from aggressive mobilization to improve activity tolerance and reduce falls risk.   Follow Up Recommendations  SNF;Supervision/Assistance - 24 hour     Equipment Recommendations  (defer to post-acute setting)    Recommendations for Other Services       Precautions / Restrictions Precautions Precautions: Fall;Other (comment) Precaution Comments: wound vac L thorax, chest tube x 2 Restrictions Weight Bearing Restrictions: No    Mobility  Bed Mobility Overal bed mobility: Needs Assistance Bed Mobility: Supine to Sit;Sit to Supine     Supine to sit: Mod assist Sit to supine: Mod assist      Transfers Overall transfer level: Needs assistance Equipment used: Rolling walker (2 wheeled) Transfers: Sit to/from Stand Sit to Stand: Min assist;Min guard(minA progressing to minG)            Ambulation/Gait Ambulation/Gait assistance: Min assist Gait Distance (Feet): 5  Feet(additional trial of 2') Assistive device: Rolling walker (2 wheeled) Gait Pattern/deviations: Step-to pattern Gait velocity: reduced Gait velocity interpretation: <1.31 ft/sec, indicative of household ambulator General Gait Details: short step to gait with reduced foot clearance bilaterally   Stairs             Wheelchair Mobility    Modified Rankin (Stroke Patients Only)       Balance Overall balance assessment: Needs assistance Sitting-balance support: Single extremity supported;Feet supported Sitting balance-Leahy Scale: Fair Sitting balance - Comments: close supervision   Standing balance support: Bilateral upper extremity supported Standing balance-Leahy Scale: Poor Standing balance comment: minA-minG with BUE support of RW                            Cognition Arousal/Alertness: Awake/alert Behavior During Therapy: WFL for tasks assessed/performed Overall Cognitive Status: Within Functional Limits for tasks assessed                                        Exercises      General Comments General comments (skin integrity, edema, etc.): pt on room air upon PT arrival, desats into 80s with mobility but quickly recovers. After bouts of ambulation pt requesting use of supplemental oxygen, needing 2L [place] briefly for comfort and to assist in recovery of sats      Pertinent Vitals/Pain Pain Assessment: Faces Faces Pain Scale: Hurts even more Pain Location: chest tube sites and abdomen Pain Descriptors / Indicators: Grimacing Pain Intervention(s): Limited  activity within patient's tolerance    Home Living                      Prior Function            PT Goals (current goals can now be found in the care plan section) Acute Rehab PT Goals Patient Stated Goal: To go to rehab and get stronger Progress towards PT goals: Progressing toward goals    Frequency    Min 3X/week      PT Plan Current plan remains  appropriate    Co-evaluation              AM-PAC PT "6 Clicks" Mobility   Outcome Measure  Help needed turning from your back to your side while in a flat bed without using bedrails?: A Little Help needed moving from lying on your back to sitting on the side of a flat bed without using bedrails?: A Lot Help needed moving to and from a bed to a chair (including a wheelchair)?: A Little Help needed standing up from a chair using your arms (e.g., wheelchair or bedside chair)?: A Little Help needed to walk in hospital room?: A Lot Help needed climbing 3-5 steps with a railing? : Total 6 Click Score: 14    End of Session Equipment Utilized During Treatment: Oxygen Activity Tolerance: Patient tolerated treatment well Patient left: in bed;with call bell/phone within reach;with bed alarm set Nurse Communication: Mobility status PT Visit Diagnosis: Unsteadiness on feet (R26.81);Muscle weakness (generalized) (M62.81)     Time: [id number] PT Time Calculation (min) (ACUTE ONLY): 31 min  Charges:  $Gait Training: 8-22 mins $Therapeutic Activity: 8-22 mins                     Zenaida Niece, PT, DPT Acute Rehabilitation Pager: [phone]    Zenaida Niece 07/15/2019, 2:42 PM

## 2019-07-16 ENCOUNTER — Inpatient Hospital Stay (HOSPITAL_COMMUNITY): Payer: Medicare Other

## 2019-07-16 LAB — CBC
HCT: 32.6 % — ABNORMAL LOW (ref 36.0–46.0)
Hemoglobin: 10.3 g/dL — ABNORMAL LOW (ref 12.0–15.0)
MCH: 29.4 pg (ref 26.0–34.0)
MCHC: 31.6 g/dL (ref 30.0–36.0)
MCV: 93.1 fL (ref 80.0–100.0)
Platelets: 248 10*3/uL (ref 150–400)
RBC: 3.5 MIL/uL — ABNORMAL LOW (ref 3.87–5.11)
RDW: 19.2 % — ABNORMAL HIGH (ref 11.5–15.5)
WBC: 10.4 10*3/uL (ref 4.0–10.5)
nRBC: 0 % (ref 0.0–0.2)

## 2019-07-16 LAB — COMPREHENSIVE METABOLIC PANEL
ALT: 12 U/L (ref 0–44)
AST: 14 U/L — ABNORMAL LOW (ref 15–41)
Albumin: 1.6 g/dL — ABNORMAL LOW (ref 3.5–5.0)
Alkaline Phosphatase: 52 U/L (ref 38–126)
Anion gap: 8 (ref 5–15)
BUN: 10 mg/dL (ref 8–23)
CO2: 33 mmol/L — ABNORMAL HIGH (ref 22–32)
Calcium: 7.5 mg/dL — ABNORMAL LOW (ref 8.9–10.3)
Chloride: 97 mmol/L — ABNORMAL LOW (ref 98–111)
Creatinine, Ser: 0.65 mg/dL (ref 0.44–1.00)
GFR calc Af Amer: >60 mL/min (ref >60)
GFR calc non Af Amer: >60 mL/min (ref >60)
Glucose, Bld: 85 mg/dL (ref 70–99)
Potassium: 3.6 mmol/L (ref 3.5–5.1)
Sodium: 138 mmol/L (ref 135–145)
Total Bilirubin: 0.2 mg/dL — ABNORMAL LOW (ref 0.3–1.2)
Total Protein: 4.3 g/dL — ABNORMAL LOW (ref 6.5–8.1)

## 2019-07-16 LAB — HEPARIN LEVEL (UNFRACTIONATED): Heparin Unfractionated: 0.42 IU/mL (ref 0.30–0.70)

## 2019-07-16 LAB — MAGNESIUM: Magnesium: 1.5 mg/dL — ABNORMAL LOW (ref 1.7–2.4)

## 2019-07-16 MED ORDER — POTASSIUM CHLORIDE CRYS ER 20 MEQ PO TBCR
40.0000 meq | EXTENDED_RELEASE_TABLET | Freq: Two times a day (BID) | ORAL | Status: DC
  Administered 2019-07-16 – 2019-07-17 (×4): 40 meq via ORAL
  Filled 2019-07-16 (×4): qty 2

## 2019-07-16 MED ORDER — LOPERAMIDE HCL 2 MG PO CAPS
2.0000 mg | ORAL_CAPSULE | ORAL | Status: DC | PRN
  Administered 2019-07-16: 09:00:00 2 mg via ORAL
  Filled 2019-07-16: qty 1

## 2019-07-16 MED ORDER — AMIODARONE HCL 200 MG PO TABS
200.0000 mg | ORAL_TABLET | Freq: Two times a day (BID) | ORAL | Status: DC
  Administered 2019-07-17 (×2): 200 mg via ORAL
  Filled 2019-07-16 (×2): qty 1

## 2019-07-16 MED ORDER — MAGNESIUM SULFATE 4 GM/100ML IV SOLN
4.0000 g | Freq: Once | INTRAVENOUS | Status: AC
  Administered 2019-07-16: 14:00:00 4 g via INTRAVENOUS
  Filled 2019-07-16: qty 100

## 2019-07-16 NOTE — Progress Notes (Signed)
3 Days Post-Op Procedure(s) (LRB): GREATER OMENTAL FLAP CLOSURE (Left) CLOSURE OF THORACOTOMY (Left) APPLICATION OF WOUND VAC USING PREVENA DRESSING (Left) Chest Tube Insertion (Right) Vascular Assessment With Spy Elite (Left) Subjective: Awake and alert, affect is bright.  Reports several loose stools yesterday and last PM. Mild abdominal discomfort, no vomiting.  Much stronger effort with PT yesterday per therapist's notes.   Objective: Vital signs in last 24 hours: Temp:  [97.7 F (36.5 C)-98 F (36.7 C)] 98 F (36.7 C) (02/18 2345) Pulse Rate:  [72-96] 74 (02/19 0400) Cardiac Rhythm: Normal sinus rhythm (02/18 2000) Resp:  [10-30] 20 (02/19 0400) BP: (106-134)/(44-68) 116/68 (02/19 0400) SpO2:  [100 %] 100 % (02/18 2345) Weight:  [72.1 kg-84.2 kg] 72.1 kg (02/19 0529)     Intake/Output from previous day: 02/18 0701 - 02/19 0700 In: 1613.1 [P.O.:580; I.V.:577.5; IV Piggyback:455.5] Out: 4820 [Urine:3750; Chest Tube:1070] Intake/Output this shift: No intake/output data recorded.  Physical Exam: General appearance:alert, cooperative and no distress Neurologic:no deficits Heart:NSR with rare PAC's Lungs:Breath sounds are clear, 465ml serous drainage from the left pleural tube and 618ml from right pleural tubes over past 24 hours. No air leak on either side. Abdomen:soft, normal bowel sounds today, minimal peri-incisional tenderness Extremities:All warm and well perfused.  Wound:The left chest wound is covered with a compressed Prevena dressing. An Aquacel bandage covers the midline abdominal incision and is dry. The dressings around the bilateral pleural drains dry.    Lab Results: Recent Labs    07/15/19 0425 07/16/19 0430  WBC 9.7 10.4  HGB 10.1* 10.3*  HCT 31.2* 32.6*  PLT 217 248   BMET:  Recent Labs    07/14/19 0502 07/15/19 0425  NA 137 137  K 4.5 3.9  CL 104 101  CO2 27 29  GLUCOSE 141* 93  BUN 11 13  CREATININE 0.77 0.77  CALCIUM 7.6*  7.5*    PT/INR: No results for input(s): LABPROT, INR in the last 72 hours. ABG    Component Value Date/Time   PHART 7.380 07/14/2019 0436   HCO3 27.9 07/14/2019 0436   TCO2 30 07/09/2019 0427   O2SAT 98.2 07/14/2019 0436   CBG (last 3)  No results for input(s): GLUCAP in the last 72 hours.  Assessment/Plan: S/P Procedure(s) (LRB): GREATER OMENTAL FLAP CLOSURE (Left) CLOSURE OF THORACOTOMY (Left) APPLICATION OF WOUND VAC USING PREVENA DRESSING (Left) Chest Tube Insertion (Right) Vascular Assessment With Spy Elite (Left)  78yof with left empyema and subsequent erosion through the left lateral chest wall.  -POD-8 left thoracotomy for drainage of empyema, debridement of the left chest wall wound, and placement of a wound vac. The vac was changed in the OR 3 days later. Initial wound Cx + for Strept intermedius (PCN-sensitive).   -POD-3 closure of the open left chest wound with greater omental flap and drainage of a right pleural effusion and chest tube placement. The chest wound is covered with a compressed Prevena dressing that will remain in place for 1-2 more days. Continue PCN-G continuous infusion. Mobilize with PT. Need to leave tubes in place for drainage. Appreciate swallow eval by SLP. She is tolerating advanced diet in small amounts.   -Paroxysmal atrial fibrillation- Holding SR on IV amiodarone at 0.5mg /min. Will convert to PO amiodarone if cardiology agrees. Continue heparin managed by pharmacy but will eventually transition to oral anticoagulation.   -Volume overload- Good response to diuresis yesterday but question -12kg wt change in 24 hours.  Deferring  diuretic mgt to  the heart  failure team.  -Expected acute blood loss anemia- Hct 31%, monitor.  -Pt had multiple loose stools. No fever, no leukocytosis, abd exam benign. No indication for C.Diff testing for now. Treat symptomatically with Imodium.   -Disposition- Requested rehab eval.    LOS: 9 days     Antony Odea, PA-C [phone] 07/16/2019

## 2019-07-16 NOTE — Progress Notes (Signed)
Physical Therapy Treatment Patient Details Name: Connie Jarvis MRN: [id number] DOB: October 30, 1940 Today's Date: 07/16/2019    History of Present Illness 79 y.o. female with medical history significant of O2 dependent COPD. Recently seen by GS for left upper chest wall lesion. S/p I&D and placed on Abx. Patient was to have further debridement of lesion but c/o SOB.  Seen in f/u by pulmonology. Patient found to have a large fungating mass, 8x20cm draining yellow fluid. CT scan of the chest revealed a large empyema measuring 13.4x12.5 cm with compressive atelectasis. Pt underwent thoracotomy with empyema drainage on 2/11.    PT Comments    Pt tolerated treatment well, demonstrating improved transfer quality with increased LE power. Pt ambulating for increased distance, requiring encouragement and 2 brief standing rest breaks. Pt will benefit from continued aggressive mobilization to improve activity tolerance and pulmonary function. PT updating recs to CIR as patient is demonstrating increased activity tolerance.  Follow Up Recommendations  CIR;Supervision/Assistance - 24 hour     Equipment Recommendations  (defer to post-acute setting)    Recommendations for Other Services       Precautions / Restrictions Precautions Precautions: Fall;Other (comment) Precaution Comments: wound vac L thorax, chest tube x 2 Restrictions Weight Bearing Restrictions: No    Mobility  Bed Mobility Overal bed mobility: Needs Assistance Bed Mobility: Supine to Sit     Supine to sit: Min assist        Transfers Overall transfer level: Needs assistance Equipment used: Rolling walker (2 wheeled) Transfers: Sit to/from Stand Sit to Stand: Min assist         General transfer comment: pt performing 4 sit to stands with minA, PT cues for technique and hand placement  Ambulation/Gait Ambulation/Gait assistance: Min assist Gait Distance (Feet): 20 Feet(additional trial of 3') Assistive device: Rolling  walker (2 wheeled) Gait Pattern/deviations: Step-to pattern;Trunk flexed Gait velocity: reduced Gait velocity interpretation: <1.8 ft/sec, indicate of risk for recurrent falls General Gait Details: pt with short step to gait with reduced foot clearance bilaterally   Stairs             Wheelchair Mobility    Modified Rankin (Stroke Patients Only)       Balance Overall balance assessment: Needs assistance Sitting-balance support: Single extremity supported;Feet supported Sitting balance-Leahy Scale: Fair Sitting balance - Comments: minG-close supervision   Standing balance support: Bilateral upper extremity supported Standing balance-Leahy Scale: Poor Standing balance comment: minA with BUE support of RW                            Cognition Arousal/Alertness: Awake/alert Behavior During Therapy: WFL for tasks assessed/performed Overall Cognitive Status: Within Functional Limits for tasks assessed                                        Exercises      General Comments General comments (skin integrity, edema, etc.): pt on 2L [place] during mobility, VSS. RN assists with line management during ambulation      Pertinent Vitals/Pain Pain Assessment: Faces Faces Pain Scale: Hurts little more Pain Location: chest tube sites and abdomen Pain Descriptors / Indicators: Grimacing Pain Intervention(s): Limited activity within patient's tolerance    Home Living                      Prior  Function            PT Goals (current goals can now be found in the care plan section) Acute Rehab PT Goals Patient Stated Goal: To go to rehab and get stronger Progress towards PT goals: Progressing toward goals    Frequency    Min 3X/week      PT Plan Discharge plan needs to be updated    Co-evaluation              AM-PAC PT "6 Clicks" Mobility   Outcome Measure  Help needed turning from your back to your side while in a flat bed  without using bedrails?: A Little Help needed moving from lying on your back to sitting on the side of a flat bed without using bedrails?: A Little Help needed moving to and from a bed to a chair (including a wheelchair)?: A Little Help needed standing up from a chair using your arms (e.g., wheelchair or bedside chair)?: A Little Help needed to walk in hospital room?: A Little Help needed climbing 3-5 steps with a railing? : Total 6 Click Score: 16    End of Session Equipment Utilized During Treatment: Oxygen Activity Tolerance: Patient tolerated treatment well Patient left: in chair;with call bell/phone within reach Nurse Communication: Mobility status PT Visit Diagnosis: Unsteadiness on feet (R26.81);Muscle weakness (generalized) (M62.81)     Time: [id number] PT Time Calculation (min) (ACUTE ONLY): 39 min  Charges:  $Gait Training: 8-22 mins $Therapeutic Activity: 23-37 mins                     Zenaida Niece, PT, DPT Acute Rehabilitation Pager: [phone]    Zenaida Niece 07/16/2019, 2:51 PM

## 2019-07-16 NOTE — Progress Notes (Signed)
Inpatient Rehab Admissions:  Inpatient Rehab Consult received.  I met with patient and her husband at the bedside for rehabilitation assessment and to discuss goals and expectations of an inpatient rehab admission.  Pt hopeful for CIR, husband asking about SNF in [place].  Explained the differences between CIR level rehab and SNF level.  Note pt with chest tubes and still requiring IV lasix BID.  Will f/u on Monday.   Signed: Shann Medal, PT, DPT Admissions Coordinator [phone] 07/16/19  1:35 PM

## 2019-07-16 NOTE — Progress Notes (Signed)
[place] for Heparin Indication: atrial fibrillation  Allergies  Allergen Reactions  . Codeine Nausea And Vomiting  . Gluten Meal Diarrhea    Abdominal pain and bloating  . Lactose Diarrhea    Bloating and abdominal pain  . Neosporin [Neomycin-Bacitracin Zn-Polymyx]   . Other     Pain medication pt. Cannot remember the name.    Patient Measurements: Height: 5' (152.4 cm) Weight: 151 lb 10.8 oz (68.8 kg) IBW/kg (Calculated) : 45.5 Heparin Dosing Weight: 61kg  Vital Signs: Temp: 98.2 F (36.8 C) (02/19 1129) Temp Source: Oral (02/19 1129) BP: 135/61 (02/19 1129) Pulse Rate: 87 (02/19 1129)  Labs: Recent Labs    07/14/19 0502 07/14/19 1200 07/15/19 0425 07/15/19 1650 07/16/19 0430 07/16/19 0714  HGB 11.2*  --  10.1*  --  10.3*  --   HCT 33.9*  --  31.2*  --  32.6*  --   PLT 236  --  217  --  248  --   HEPARINUNFRC  --    < > 0.77* 0.49  --  0.42  CREATININE 0.77  --  0.77  --  0.65  --    < > = values in this interval not displayed.    Estimated Creatinine Clearance: 51.8 mL/min (by C-G formula based on SCr of 0.65 mg/dL).  Assessment: 79 year old female s/p thoracotomy and drainage of empyema. Currently on heparin for in/out AFib.   Heparin level is therapeutic at 0.42.    No current issues per RN with infusion and no bleeding observed. CBC is stable.   Goal of Therapy:  Heparin level 0.3-0.5 units/ml Aim for ~0.3 Monitor platelets by anticoagulation protocol: Yes   Plan:  Continue IV heparin at 950 units/hr. Follow-up daily levels and CBC.  Oral anticoagulation soon?  Marguerite Olea, [facility] Clinical Pharmacist Phone [phone]  07/16/2019 1:59 PM

## 2019-07-16 NOTE — Progress Notes (Addendum)
Advanced Heart Failure Rounding Note   Subjective:    S/p thoracotomy on 2/11.  S/P Greater Omental Flap Closure. Wound vac in place.    Pleural fluid GS +++GPC. Cx back Strep intermedius--> on penicillin  In NSR. On amio 30 mg per hour.   Responding well to IV Lasix. -3.8L out yesterday. CVP 8-9  Sitting up in bed. Husband at bedside. Feels better today. Remains on [place]. Appetite is poor.    Objective:   Weight Range:  Vital Signs:   Temp:  [97.7 F (36.5 C)-98.5 F (36.9 C)] 98.2 F (36.8 C) (02/19 1129) Pulse Rate:  [72-96] 87 (02/19 1129) Resp:  [0-30] 11 (02/19 1129) BP: (106-135)/(44-68) 135/61 (02/19 1129) SpO2:  [98 %-100 %] 98 % (02/19 1205) Weight:  [68.8 kg-72.1 kg] 68.8 kg (02/19 1341) Last BM Date: 07/12/19  Weight change: Filed Weights   07/15/19 0907 07/16/19 0529 07/16/19 1341  Weight: 84.2 kg 72.1 kg 68.8 kg    Intake/Output:   Intake/Output Summary (Last 24 hours) at 07/16/2019 1358 Last data filed at 07/16/2019 1200 Gross per 24 hour  Intake 1465.83 ml  Output 3650 ml  Net -2184.17 ml     Physical Exam: CVP 8-9 General: thin/fatigue appearing WF No resp difficulty HEENT: normal anicteric Neck: supple. JVP 7-8 cm. Carotids 2+ bilat; no bruits. No lymphadenopathy or thryomegaly appreciated. Cor: PMI nondisplaced. Regular rate & rhythm. No rubs, gallops or murmurs. CT x2  + wound vac midline incision dressing clean/dry  Lungs: + crackles  Abdomen: soft, nontender, nondistended. No hepatosplenomegaly. No bruits or masses. Good bowel sounds. Extremities: no cyanosis, clubbing, rash, 2+ bilateral LEE edema up to knees  Neuro: alert & oriented x 3, cranial nerves grossly intact. moves all 4 extremities w/o difficulty. Affect pleasant     Telemetry: NSR SR 80-90s personally reviewed.    Labs: Basic Metabolic Panel: Recent Labs  Lab 07/10/19 0430 07/11/19 0338 07/12/19 0501 07/12/19 0501 07/13/19 0520 07/13/19 0520 07/14/19 0502  07/15/19 0425 07/16/19 0430  NA 141   < > 139  --  141  --  137 137 138  K 4.2   < > 4.1  --  3.3*  --  4.5 3.9 3.6  CL 105   < > 102  --  106  --  104 101 97*  CO2 27   < > 28  --  31  --  27 29 33*  GLUCOSE 70   < > 113*  --  89  --  141* 93 85  BUN 7*   < > 7*  --  7*  --  _0 CREATININE 0.75   < > 0.60  --  0.61  --  0.77 0.77 0.65  CALCIUM 7.6*   < > 7.5*   < > 7.4*   < > 7.6* 7.5* 7.5*  MG 1.1*  --   --   --   --   --   --   --  1.5*   < > = values in this interval not displayed.    Liver Function Tests: Recent Labs  Lab 07/12/19 0501 07/13/19 0520 07/14/19 0502 07/15/19 0425 07/16/19 0430  AST _1 14*  ALT _2 ALKPHOS 47 45 47 46 52  BILITOT 0.7 0.6 0.6 0.6 0.2*  PROT 5.0* 4.6* 4.6* 4.3* 4.3*  ALBUMIN 1.8* 1.6* 1.7* 1.7* 1.6*   No results for input(s): LIPASE, AMYLASE in  the last 168 hours. No results for input(s): AMMONIA in the last 168 hours.  CBC: Recent Labs  Lab 07/12/19 0501 07/13/19 0520 07/14/19 0502 07/15/19 0425 07/16/19 0430  WBC 8.5 6.0 13.1* 9.7 10.4  HGB 8.1* 6.9* 11.2* 10.1* 10.3*  HCT 25.0* 22.3* 33.9* 31.2* 32.6*  MCV 89.3 91.4 89.9 91.0 93.1  PLT 223 211 236 217 248    Cardiac Enzymes: No results for input(s): CKTOTAL, CKMB, CKMBINDEX, TROPONINI in the last 168 hours.  BNP: BNP (last 3 results) No results for input(s): BNP in the last 8760 hours.  ProBNP (last 3 results) No results for input(s): PROBNP in the last 8760 hours.    Other results:  Imaging: DG Chest 2 View  Result Date: 07/16/2019 CLINICAL DATA:  Right pleural effusion. EXAM: CHEST - 2 VIEW COMPARISON:  07/15/2019. FINDINGS: Left IJ line, bilateral chest tubes in stable position. No pneumothorax. Stable left base consolidation and left-sided pleural effusion. Surgical staples over the left chest and abdomen. IMPRESSION: 1. Left IJ line and bilateral chest tubes in stable position. No pneumothorax. 2. Unchanged consolidation left lung  base. Unchanged left-sided pleural effusion. Electronically Signed   By: Marcello Moores  Register   On: 07/16/2019 08:05   DG CHEST PORT 1 VIEW  Result Date: 07/15/2019 CLINICAL DATA:  Postop check EXAM: PORTABLE CHEST 1 VIEW COMPARISON:  Two days ago FINDINGS: Chest tubes in place. Left IJ line with tip at the upper cavoatrial junction. Continued retrocardiac opacification where there was a thick walled pleural effusions seen by prior CT. No visible pneumothorax. IMPRESSION: Stable hardware positioning and residual pleuroparenchymal opacity at the left base. Electronically Signed   By: Monte Fantasia M.D.   On: 07/15/2019 07:58     Medications:     Scheduled Medications: . acetaminophen  1,000 mg Oral Q6H   Or  . acetaminophen (TYLENOL) oral liquid 160 mg/5 mL  1,000 mg Oral Q6H  . bisacodyl  10 mg Oral Daily  . celecoxib  200 mg Per Tube BID  . chlorhexidine gluconate (MEDLINE KIT)  15 mL Mouth Rinse BID  . Chlorhexidine Gluconate Cloth  6 each Topical Daily  . docusate sodium  100 mg Oral Daily  . feeding supplement (ENSURE ENLIVE)  237 mL Oral TID WC  . furosemide  80 mg Intravenous BID  . Gerhardt's butt cream   Topical BID  . levalbuterol  0.63 mg Nebulization BID  . mouth rinse  15 mL Mouth Rinse BID  . mometasone-formoterol  2 puff Inhalation BID  . montelukast  10 mg Per Tube Daily  . pantoprazole  40 mg Oral Daily  . potassium chloride  40 mEq Oral BID  . senna-docusate  1 tablet Oral QHS    Infusions: . sodium chloride 50 mL/hr at 07/14/19 1033  . sodium chloride    . sodium chloride    . amiodarone 30 mg/hr (07/16/19 1201)  . heparin 950 Units/hr (07/16/19 1200)  . magnesium sulfate bolus IVPB    . norepinephrine (LEVOPHED) Adult infusion Stopped (07/11/19 0802)  . 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 Wound vac in place -S/P 2/16 Greater Omental Flap Closure CT x2 + wound vac - GS with +++GPC. Culture strep intermedius--> on penicillin -  TCTS managing.  2. PAH - very mild by cath PVR < 1.0 - Sildenafil off with low BP.  - Restart once improved.   3. PAF -Maintaining NSR.  Continue amio 30 mg per hour + heparin drip.   4. Volume overload -LVEF normal RV normal.  - Improving w/ diuresis but still volume overloaded. Renal function stable. Continue IV Lasix 80 mg bid -Continue ted hose.  5. Hypokalemia/Hypomagnesemia - K 3.6 today  - Will give supp w/ Lasix  6. Severe protein calorie malnutrition -Albumin 1.7 - Passed speech eval.  - will get Nutrition consult  7. Anemia  - Hgb 10.3 - monitor on heparin  8. Severe Deconditioning - PT following. Recommending SNF.   9. Hypomagnesemia  - Mg 1.5 - supp  MgSO4 x 1    Length of Stay: 9  Brittainy Simmons PA-C 07/16/2019, 1:58 PM  Advanced Heart Failure Team Pager [phone] (M-F; [place])  Please contact [facility] for night-coverage after hours (4p -7a ) and weekends on [email or web address]  Patient seen and examined with the above-signed Advanced Practice Provider and/or Housestaff. I personally reviewed laboratory data, imaging studies and relevant notes. I independently examined the patient and formulated the important aspects of the plan. I have edited the note to reflect any of my changes or salient points. I have personally discussed the plan with the patient and/or family.  Remains weak but improving. Wound vac site ok. Afebrile. Remains in NSR on IV amio. Volume status improved on IV lasix.   Likely can stop IV lasix tomorrow. Switch amio to po in am. Once all procedures are complete can switch to Eliquis. Continue PT.  Encouraged IS. Will need SNF.  Glori Bickers, MD  2:38 PM

## 2019-07-17 ENCOUNTER — Inpatient Hospital Stay (HOSPITAL_COMMUNITY): Payer: Medicare Other

## 2019-07-17 LAB — BASIC METABOLIC PANEL
Anion gap: 7 (ref 5–15)
BUN: 9 mg/dL (ref 8–23)
CO2: 34 mmol/L — ABNORMAL HIGH (ref 22–32)
Calcium: 7.6 mg/dL — ABNORMAL LOW (ref 8.9–10.3)
Chloride: 96 mmol/L — ABNORMAL LOW (ref 98–111)
Creatinine, Ser: 0.74 mg/dL (ref 0.44–1.00)
GFR calc Af Amer: >60 mL/min (ref >60)
GFR calc non Af Amer: >60 mL/min (ref >60)
Glucose, Bld: 96 mg/dL (ref 70–99)
Potassium: 4.4 mmol/L (ref 3.5–5.1)
Sodium: 137 mmol/L (ref 135–145)

## 2019-07-17 LAB — CBC
HCT: 32 % — ABNORMAL LOW (ref 36.0–46.0)
Hemoglobin: 10.5 g/dL — ABNORMAL LOW (ref 12.0–15.0)
MCH: 30 pg (ref 26.0–34.0)
MCHC: 32.8 g/dL (ref 30.0–36.0)
MCV: 91.4 fL (ref 80.0–100.0)
Platelets: 266 10*3/uL (ref 150–400)
RBC: 3.5 MIL/uL — ABNORMAL LOW (ref 3.87–5.11)
RDW: 19.9 % — ABNORMAL HIGH (ref 11.5–15.5)
WBC: 9.2 10*3/uL (ref 4.0–10.5)
nRBC: 0 % (ref 0.0–0.2)

## 2019-07-17 LAB — HEPARIN LEVEL (UNFRACTIONATED): Heparin Unfractionated: 0.53 IU/mL (ref 0.30–0.70)

## 2019-07-17 MED ORDER — FUROSEMIDE 10 MG/ML IJ SOLN
80.0000 mg | Freq: Two times a day (BID) | INTRAMUSCULAR | Status: AC
  Administered 2019-07-17 – 2019-07-18 (×3): 80 mg via INTRAVENOUS
  Filled 2019-07-17 (×3): qty 8

## 2019-07-17 MED ORDER — LEVALBUTEROL HCL 0.63 MG/3ML IN NEBU
0.6300 mg | INHALATION_SOLUTION | Freq: Four times a day (QID) | RESPIRATORY_TRACT | Status: DC | PRN
  Filled 2019-07-17: qty 3

## 2019-07-17 MED ORDER — COLCHICINE 0.3 MG HALF TABLET
0.3000 mg | ORAL_TABLET | Freq: Two times a day (BID) | ORAL | Status: DC
  Administered 2019-07-17 (×2): 0.3 mg via ORAL
  Filled 2019-07-17 (×3): qty 1

## 2019-07-17 MED ORDER — PROMETHAZINE HCL 25 MG/ML IJ SOLN
6.2500 mg | Freq: Three times a day (TID) | INTRAMUSCULAR | Status: DC | PRN
  Filled 2019-07-17: qty 1

## 2019-07-17 NOTE — Progress Notes (Signed)
4 Days Post-Op Procedure(s) (LRB): GREATER OMENTAL FLAP CLOSURE (Left) CLOSURE OF THORACOTOMY (Left) APPLICATION OF WOUND VAC USING PREVENA DRESSING (Left) Chest Tube Insertion (Right) Vascular Assessment With Spy Elite (Left) Subjective: Nauseated this am  Objective: Vital signs in last 24 hours: Temp:  [97.6 F (36.4 C)-98.7 F (37.1 C)] 97.7 F (36.5 C) (02/20 0746) Pulse Rate:  [79-87] 79 (02/20 0746) Cardiac Rhythm: Normal sinus rhythm (02/20 0701) Resp:  [11-28] 14 (02/20 0746) BP: (125-135)/(58-71) 126/71 (02/20 0746) SpO2:  [98 %-100 %] 99 % (02/20 0746) Weight:  [68.8 kg] 68.8 kg (02/19 1341)  Hemodynamic parameters for last 24 hours:    Intake/Output from previous day: 02/19 0701 - 02/20 0700 In: 2002.9 [P.O.:120; I.V.:609.6; IV Piggyback:1273.2] Out: [id number] [Urine:2875; Chest Tube:510] Intake/Output this shift: Total I/O In: 134.7 [P.O.:120; I.V.:14.7] Out: -   General appearance: alert and cooperative Neurologic: intact Heart: regular rate and rhythm, S1, S2 normal, no murmur, click, rub or gallop Lungs: clear to auscultation bilaterally Abdomen: mildly distended; no tenderness to percussion or palpation Extremities: edema 2+ Wound: small hematoma of upper abdominal incision; left chest incision dressing taken down: inferior aspect intact with mild edema no drainage. Tubes intact  Lab Results: Recent Labs    07/16/19 0430 07/17/19 0446  WBC 10.4 9.2  HGB 10.3* 10.5*  HCT 32.6* 32.0*  PLT 248 266   BMET:  Recent Labs    07/16/19 0430 07/17/19 0446  NA 138 137  K 3.6 4.4  CL 97* 96*  CO2 33* 34*  GLUCOSE 85 96  BUN 10 9  CREATININE 0.65 0.74  CALCIUM 7.5* 7.6*    PT/INR: No results for input(s): LABPROT, INR in the last 72 hours. ABG    Component Value Date/Time   PHART 7.380 07/14/2019 0436   HCO3 27.9 07/14/2019 0436   TCO2 30 07/09/2019 0427   O2SAT 98.2 07/14/2019 0436   CBG (last 3)  No results for input(s): GLUCAP in the last  72 hours.  Assessment/Plan: S/P Procedure(s) (LRB): GREATER OMENTAL FLAP CLOSURE (Left) CLOSURE OF THORACOTOMY (Left) APPLICATION OF WOUND VAC USING PREVENA DRESSING (Left) Chest Tube Insertion (Right) Vascular Assessment With Spy Elite (Left) Diuresis KUB to assess n/v Place all tubes to water seal D/c heparin for now given abdominal wound hematoma; may be able to restart apixiban low dose tomorrow.  LOS: 10 days    Wonda Olds 07/17/2019

## 2019-07-17 NOTE — Plan of Care (Signed)

## 2019-07-17 NOTE — Progress Notes (Signed)
[place] for Heparin Indication: atrial fibrillation  Allergies  Allergen Reactions  . Codeine Nausea And Vomiting  . Gluten Meal Diarrhea    Abdominal pain and bloating  . Lactose Diarrhea    Bloating and abdominal pain  . Neosporin [Neomycin-Bacitracin Zn-Polymyx]   . Other     Pain medication pt. Cannot remember the name.    Patient Measurements: Height: 5' (152.4 cm) Weight: 151 lb 10.8 oz (68.8 kg) IBW/kg (Calculated) : 45.5 Heparin Dosing Weight: 61kg  Vital Signs: Temp: 97.9 F (36.6 C) (02/20 0746) Temp Source: Oral (02/20 0746) BP: 103/55 (02/20 0746) Pulse Rate: 60 (02/20 0746)  Labs: Recent Labs    07/15/19 0425 07/15/19 0425 07/15/19 1650 07/16/19 0430 07/16/19 0714 07/17/19 0446  HGB 10.1*   < >  --  10.3*  --  10.5*  HCT 31.2*  --   --  32.6*  --  32.0*  PLT 217  --   --  248  --  266  HEPARINUNFRC 0.77*   < > 0.49  --  0.42 0.53  CREATININE 0.77  --   --  0.65  --  0.74   < > = values in this interval not displayed.    Estimated Creatinine Clearance: 51.8 mL/min (by C-G formula based on SCr of 0.74 mg/dL).  Assessment: 79 year old female s/p thoracotomy and drainage of empyema. Currently on heparin for AFib.   Heparin level is therapeutic at 0.53.    No current issues per RN with infusion and no bleeding observed. CBC is stable.   Goal of Therapy:  Heparin level 0.3-0.5 units/ml Aim for ~0.3 Monitor platelets by anticoagulation protocol: Yes   Plan:  Reduce IV heparin to 850 units/hr. Follow-up daily levels and CBC.   Erin Hearing PharmD., BCPS Clinical Pharmacist 07/17/2019 7:52 AM

## 2019-07-18 ENCOUNTER — Inpatient Hospital Stay (HOSPITAL_COMMUNITY): Payer: Medicare Other

## 2019-07-18 LAB — BASIC METABOLIC PANEL
Anion gap: 6 (ref 5–15)
BUN: 10 mg/dL (ref 8–23)
CO2: 34 mmol/L — ABNORMAL HIGH (ref 22–32)
Calcium: 7.6 mg/dL — ABNORMAL LOW (ref 8.9–10.3)
Chloride: 94 mmol/L — ABNORMAL LOW (ref 98–111)
Creatinine, Ser: 0.92 mg/dL (ref 0.44–1.00)
GFR calc Af Amer: >60 mL/min (ref >60)
GFR calc non Af Amer: >60 mL/min (ref >60)
Glucose, Bld: 186 mg/dL — ABNORMAL HIGH (ref 70–99)
Potassium: 7.4 mmol/L (ref 3.5–5.1)
Sodium: 134 mmol/L — ABNORMAL LOW (ref 135–145)

## 2019-07-18 LAB — CBC
HCT: 32.1 % — ABNORMAL LOW (ref 36.0–46.0)
Hemoglobin: 10.4 g/dL — ABNORMAL LOW (ref 12.0–15.0)
MCH: 30.1 pg (ref 26.0–34.0)
MCHC: 32.4 g/dL (ref 30.0–36.0)
MCV: 93 fL (ref 80.0–100.0)
Platelets: 268 10*3/uL (ref 150–400)
RBC: 3.45 MIL/uL — ABNORMAL LOW (ref 3.87–5.11)
RDW: 20.6 % — ABNORMAL HIGH (ref 11.5–15.5)
WBC: 7.6 10*3/uL (ref 4.0–10.5)
nRBC: 0 % (ref 0.0–0.2)

## 2019-07-18 LAB — POTASSIUM: Potassium: 5.1 mmol/L (ref 3.5–5.1)

## 2019-07-18 MED ORDER — DOCUSATE SODIUM 100 MG PO CAPS: 100.0000 mg | ORAL_CAPSULE | Freq: Every day | ORAL | Status: DC | PRN

## 2019-07-18 MED ORDER — LOPERAMIDE HCL 1 MG/7.5ML PO SUSP
1.0000 mg | ORAL | Status: DC | PRN
  Filled 2019-07-18: qty 7.5

## 2019-07-18 MED ORDER — BISACODYL 5 MG PO TBEC: 10.0000 mg | DELAYED_RELEASE_TABLET | Freq: Every day | ORAL | Status: DC | PRN

## 2019-07-18 NOTE — Progress Notes (Signed)
5 Days Post-Op Procedure(s) (LRB): GREATER OMENTAL FLAP CLOSURE (Left) CLOSURE OF THORACOTOMY (Left) APPLICATION OF WOUND VAC USING PREVENA DRESSING (Left) Chest Tube Insertion (Right) Vascular Assessment With Spy Elite (Left) Subjective:continued diarrhea and nausea  Objective: Vital signs in last 24 hours: Temp:  [97.7 F (36.5 C)-98.3 F (36.8 C)] 98.3 F (36.8 C) (02/21 0800) Pulse Rate:  [73-87] 84 (02/21 0800) Cardiac Rhythm: Normal sinus rhythm (02/21 0400) Resp:  [0-29] 18 (02/21 0800) BP: (109-154)/(51-78) 154/75 (02/21 0800) SpO2:  [98 %-100 %] 100 % (02/21 0800) Weight:  [69.5 kg] 69.5 kg (02/21 0400)  Hemodynamic parameters for last 24 hours:    Intake/Output from previous day: 02/20 0701 - 02/21 0700 In: 1654.7 [P.O.:1140; I.V.:14.7; IV Piggyback:500] Out: 3085 [Urine:2775; Chest Tube:310] Intake/Output this shift: No intake/output data recorded.  General appearance: alert, cooperative and no distress Neurologic: intact Heart: regular rate and rhythm, S1, S2 normal, no murmur, click, rub or gallop Lungs: clear to auscultation bilaterally Abdomen: soft, non-tender; bowel sounds normal; no masses,  no organomegaly Extremities: edema 2+ Wound: c/d/i  Lab Results: Recent Labs    07/17/19 0446 07/18/19 0535  WBC 9.2 7.6  HGB 10.5* 10.4*  HCT 32.0* 32.1*  PLT 266 268   BMET:  Recent Labs    07/17/19 0446 07/18/19 0535  NA 137 134*  K 4.4 7.4*  CL 96* 94*  CO2 34* 34*  GLUCOSE 96 186*  BUN 9 10  CREATININE 0.74 0.92  CALCIUM 7.6* 7.6*    PT/INR: No results for input(s): LABPROT, INR in the last 72 hours. ABG    Component Value Date/Time   PHART 7.380 07/14/2019 0436   HCO3 27.9 07/14/2019 0436   TCO2 30 07/09/2019 0427   O2SAT 98.2 07/14/2019 0436   CBG (last 3)  No results for input(s): GLUCAP in the last 72 hours.  Assessment/Plan: S/P Procedure(s) (LRB): GREATER OMENTAL FLAP CLOSURE (Left) CLOSURE OF THORACOTOMY  (Left) APPLICATION OF WOUND VAC USING PREVENA DRESSING (Left) Chest Tube Insertion (Right) Vascular Assessment With Spy Elite (Left) adjust diet to reflect allergies to lactose and gluten  Consider CT abd   LOS: 11 days    Wonda Olds 07/18/2019

## 2019-07-18 NOTE — Plan of Care (Signed)

## 2019-07-19 DIAGNOSIS — E440 Moderate protein-calorie malnutrition: Secondary | ICD-10-CM | POA: Insufficient documentation

## 2019-07-19 LAB — BASIC METABOLIC PANEL
Anion gap: 8 (ref 5–15)
BUN: 13 mg/dL (ref 8–23)
CO2: 36 mmol/L — ABNORMAL HIGH (ref 22–32)
Calcium: 8 mg/dL — ABNORMAL LOW (ref 8.9–10.3)
Chloride: 94 mmol/L — ABNORMAL LOW (ref 98–111)
Creatinine, Ser: 0.75 mg/dL (ref 0.44–1.00)
GFR calc Af Amer: >60 mL/min (ref >60)
GFR calc non Af Amer: >60 mL/min (ref >60)
Glucose, Bld: 79 mg/dL (ref 70–99)
Potassium: 4.4 mmol/L (ref 3.5–5.1)
Sodium: 138 mmol/L (ref 135–145)

## 2019-07-19 LAB — MAGNESIUM: Magnesium: 1.9 mg/dL (ref 1.7–2.4)

## 2019-07-19 LAB — CBC
HCT: 29.9 % — ABNORMAL LOW (ref 36.0–46.0)
Hemoglobin: 9.5 g/dL — ABNORMAL LOW (ref 12.0–15.0)
MCH: 30 pg (ref 26.0–34.0)
MCHC: 31.8 g/dL (ref 30.0–36.0)
MCV: 94.3 fL (ref 80.0–100.0)
Platelets: 252 10*3/uL (ref 150–400)
RBC: 3.17 MIL/uL — ABNORMAL LOW (ref 3.87–5.11)
RDW: 20.5 % — ABNORMAL HIGH (ref 11.5–15.5)
WBC: 6.9 10*3/uL (ref 4.0–10.5)
nRBC: 0 % (ref 0.0–0.2)

## 2019-07-19 MED ORDER — ENSURE ENLIVE PO LIQD
237.0000 mL | Freq: Three times a day (TID) | ORAL | Status: DC
  Administered 2019-07-19 – 2019-07-24 (×13): 237 mL via ORAL

## 2019-07-19 MED ORDER — ADULT MULTIVITAMIN W/MINERALS CH
1.0000 | ORAL_TABLET | Freq: Every day | ORAL | Status: DC
  Administered 2019-07-19 – 2019-07-24 (×6): 1 via ORAL
  Filled 2019-07-19 (×6): qty 1

## 2019-07-19 MED ORDER — FUROSEMIDE 10 MG/ML IJ SOLN
40.0000 mg | Freq: Two times a day (BID) | INTRAMUSCULAR | Status: DC
  Administered 2019-07-19 – 2019-07-20 (×3): 40 mg via INTRAVENOUS
  Filled 2019-07-19 (×3): qty 4

## 2019-07-19 NOTE — Progress Notes (Signed)
Physical Therapy Treatment Patient Details Name: Connie Jarvis MRN: [id number] DOB: 03-08-1941 Today's Date: 07/19/2019    History of Present Illness 79 y.o. female with medical history significant of O2 dependent COPD. Recently seen by GS for left upper chest wall lesion. S/p I&D and placed on Abx. Patient was to have further debridement of lesion but c/o SOB.  Seen in f/u by pulmonology. Patient found to have a large fungating mass, 8x20cm draining yellow fluid. CT scan of the chest revealed a large empyema measuring 13.4x12.5 cm with compressive atelectasis. Pt underwent thoracotomy with empyema drainage on 2/11.    PT Comments    Patient is making progress toward PT goals and tolerated increased gait distance well. Continue to recommend CIR for further skilled PT services to maximize independence and safety with mobility.    Follow Up Recommendations  CIR;Supervision/Assistance - 24 hour     Equipment Recommendations  (defer to post-acute setting)    Recommendations for Other Services       Precautions / Restrictions Precautions Precautions: Fall Precaution Comments: LUQ bulb drains  Restrictions Weight Bearing Restrictions: No    Mobility  Bed Mobility Overal bed mobility: Needs Assistance Bed Mobility: Rolling;Sidelying to Sit Rolling: Supervision Sidelying to sit: Min guard       General bed mobility comments: cues for sequencing and use of rail  Transfers Overall transfer level: Needs assistance Equipment used: Rolling walker (2 wheeled) Transfers: Sit to/from Stand Sit to Stand: Min assist         General transfer comment: assist to power up into standing from EOB and recliner; demonstrates safe hand placement  Ambulation/Gait Ambulation/Gait assistance: Min guard;Min assist Gait Distance (Feet): (30 ft X 2 triasl with seated break) Assistive device: Rolling walker (2 wheeled) Gait Pattern/deviations: Step-through pattern;Decreased stride length Gait  velocity: reduced   General Gait Details: cues for upright posture; assist to steady at times but grossly min guard for safety; SpO2 99% on 3L O2 via [place]; pt SOB and with increased RR    Stairs             Wheelchair Mobility    Modified Rankin (Stroke Patients Only)       Balance Overall balance assessment: Needs assistance Sitting-balance support: Single extremity supported;Feet supported Sitting balance-Leahy Scale: Fair     Standing balance support: Bilateral upper extremity supported Standing balance-Leahy Scale: Poor                              Cognition Arousal/Alertness: Awake/alert Behavior During Therapy: WFL for tasks assessed/performed Overall Cognitive Status: Within Functional Limits for tasks assessed                                        Exercises      General Comments        Pertinent Vitals/Pain Pain Assessment: Faces Faces Pain Scale: Hurts a little bit Pain Location: chest Pain Descriptors / Indicators: Discomfort Pain Intervention(s): Monitored during session;Repositioned    Home Living                      Prior Function            PT Goals (current goals can now be found in the care plan section) Progress towards PT goals: Progressing toward goals    Frequency  Min 3X/week      PT Plan Current plan remains appropriate    Co-evaluation              AM-PAC PT "6 Clicks" Mobility   Outcome Measure  Help needed turning from your back to your side while in a flat bed without using bedrails?: A Little Help needed moving from lying on your back to sitting on the side of a flat bed without using bedrails?: A Little Help needed moving to and from a bed to a chair (including a wheelchair)?: A Little Help needed standing up from a chair using your arms (e.g., wheelchair or bedside chair)?: A Little Help needed to walk in hospital room?: A Little Help needed climbing 3-5 steps with  a railing? : Total 6 Click Score: 16    End of Session Equipment Utilized During Treatment: Oxygen Activity Tolerance: Patient tolerated treatment well Patient left: in chair;with call bell/phone within reach Nurse Communication: Mobility status PT Visit Diagnosis: Unsteadiness on feet (R26.81);Muscle weakness (generalized) (M62.81)     Time: [id number] PT Time Calculation (min) (ACUTE ONLY): 39 min  Charges:  $Gait Training: 23-37 mins $Therapeutic Activity: 8-22 mins                     Earney Navy, PTA Acute Rehabilitation Services Pager: [phone] Office: [phone]     Darliss Cheney 07/19/2019, 4:59 PM

## 2019-07-19 NOTE — Progress Notes (Signed)
Nutrition Follow-up  DOCUMENTATION CODES:   Non-severe (moderate) malnutrition in context of chronic illness  INTERVENTION:    Continue Ensure Enlive po BID, each supplement provides 350 kcal and 20 grams of protein  Add Magic cup TID with meals, each supplement provides 290 kcal and 9 grams of protein   Add MVI daily   NUTRITION DIAGNOSIS:   Moderate Malnutrition related to chronic illness(COPD) as evidenced by moderate fat depletion, severe muscle depletion, mild muscle depletion.  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 PMH includes pulmonary HTN, COPD on home oxygen, GERD  2/11 L. Thoracotomy, wound vac placed 2/14 OR for wound VAC change, wound debridement 2/16- s/p closure, wound VAC  Pt reports appetite is slowly progressing. Meal completions as 0-70% for her last eight meals (21% average). Drinking 0.5-1 Ensures daily. Enjoys orange sherbet and is willing to try magic for extra protein. Pt endorses a loss of appetite for months PTA. Unable to elaborate further as she was undergoing nursing care.   NFPE performed. Shows to have moderate to severe depletions. Suspect fluid may be masking some depletion/weight loss.   Admission weight: 71.6 kg  Current weight: 64.2 kg   I/O: -2,483 ml since admit  UOP: 1,000 ml x 24 hrs  Chest tubes: 306 ml x 24 hrs   Medications: 40 mg lasix BID Labs: reviewed   NUTRITION - FOCUSED PHYSICAL EXAM:    Most Recent Value  Orbital Region  Moderate depletion  Upper Arm Region  Mild depletion  Thoracic and Lumbar Region  Unable to assess  Buccal Region  Mild depletion  Temple Region  Severe depletion   Clavicle Bone Region  Severe depletion  Clavicle and Acromion Bone Region  Severe depletion  Scapular Bone Region  Unable to assess  Dorsal Hand  Mild depletion  Patellar Region  No depletion  Anterior Thigh Region  No depletion  Posterior Calf Region  No depletion  Edema (RD Assessment)  Moderate  Hair  Reviewed  Eyes  Reviewed  Mouth  Reviewed  Skin  Reviewed  Nails  Reviewed       Diet Order:   Diet Order            Diet gluten free Room service appropriate? Yes; Fluid consistency: Thin; Fluid restriction: 1800 mL Fluid  Diet effective now              EDUCATION NEEDS:   Education needs have been addressed  Skin:  Skin Assessment: Skin Integrity Issues: Skin Integrity Issues:: Stage I, Incisions Stage I: sacrum Wound Vac: n/a Incisions: chest  Last BM:  2/21  Height:   Ht Readings from Last 1 Encounters:  07/07/19 5' (1.524 m)    Weight:   Wt Readings from Last 1 Encounters:  07/19/19 64.2 kg    BMI:  Body mass index is 27.64 kg/m.  Estimated Nutritional Needs:   Kcal:  1600-1800 kcals  Protein:  80-90 g  Fluid:  >/= 1.6 L  Mariana Single RD, LDN Clinical Nutrition Pager listed in [place]

## 2019-07-19 NOTE — Progress Notes (Signed)
Left chest tubes attached to [employer] valve by PA with no order.

## 2019-07-19 NOTE — Progress Notes (Signed)
6 Days Post-Op Procedure(s) (LRB): GREATER OMENTAL FLAP CLOSURE (Left) CLOSURE OF THORACOTOMY (Left) APPLICATION OF WOUND VAC USING PREVENA DRESSING (Left) Chest Tube Insertion (Right) Vascular Assessment With Spy Elite (Left) Subjective: Awake and alert, says she is feeling much better. No further nausea since yesterday. Loose stools x 2 yesterday. She is "interested" in having breakfast this morning.  Objective: Vital signs in last 24 hours: Temp:  [97.8 F (36.6 C)-98.1 F (36.7 C)] 97.8 F (36.6 C) (02/22 0723) Pulse Rate:  [54-84] 80 (02/22 0738) Cardiac Rhythm: Normal sinus rhythm (02/22 0400) Resp:  [0-30] 21 (02/22 0738) BP: (98-120)/(27-72) 112/63 (02/22 0723) SpO2:  [95 %-100 %] 100 % (02/22 0738) Weight:  [64.2 kg] 64.2 kg (02/22 0314)     Intake/Output from previous day: 02/21 0701 - 02/22 0700 In: 485 [P.O.:485] Out: 1306 [Urine:1000; Chest Tube:306] Intake/Output this shift: No intake/output data recorded.  Physical Exam: General appearance:alert, cooperative andnodistress Neurologic:no deficits Heart:NSR with rare PAC's Lungs:Breath sounds are clear,137ml serous drainagefrom the left pleural tubes and 51ml from right pleural tube over past 24 hours. No air leak on either side. Abdomen:soft,normalbowel soundstoday, minimal peri-incisional tenderness Extremities:All warm and well perfused.  Wound:The left chest wound is covered with a dry dressing (the Prevena has been removed). The midline abdominal incision is dry. The dressings around the bilateral pleural drainsdry.Lab Results: Recent Labs    07/18/19 0535 07/19/19 0452  WBC 7.6 6.9  HGB 10.4* 9.5*  HCT 32.1* 29.9*  PLT 268 252   BMET:  Recent Labs    07/18/19 0535 07/18/19 0535 07/18/19 1042 07/19/19 0452  NA 134*  --   --  138  K 7.4*   < > 5.1 4.4  CL 94*  --   --  94*  CO2 34*  --   --  36*  GLUCOSE 186*  --   --  79  BUN 10  --   --  13  CREATININE 0.92  --   --   0.75  CALCIUM 7.6*  --   --  8.0*   < > = values in this interval not displayed.    PT/INR: No results for input(s): LABPROT, INR in the last 72 hours. ABG    Component Value Date/Time   PHART 7.380 07/14/2019 0436   HCO3 27.9 07/14/2019 0436   TCO2 30 07/09/2019 0427   O2SAT 98.2 07/14/2019 0436   CBG (last 3)  No results for input(s): GLUCAP in the last 72 hours.  Assessment/Plan: S/P Procedure(s) (LRB): GREATER OMENTAL FLAP CLOSURE (Left) CLOSURE OF THORACOTOMY (Left) APPLICATION OF WOUND VAC USING PREVENA DRESSING (Left) Chest Tube Insertion (Right) Vascular Assessment With Spy Elite (Left)  78yof with left empyema and subsequent erosion through the left lateral chest wall.  -POD-11 left thoracotomy for drainage of empyema, debridement of the left chest wall wound, and placement of a wound vac. The vac was changed in the OR 3 days later. Initial wound Cx + for Strept intermedius(PCN-sensitive).   -POD6closure of the open left chest wound with greater omental flap and drainage of a right pleural effusion and chest tube placement.  Continue PCN-G continuous infusion. Mobilize with PT. CT drainage has declined, may be able to get the pleural tubes out and convert left wound drain to bulb suction.   -Paroxysmal atrial fibrillation- Holding SR,  amiodaroneand heparin discontinued.   -Volume overload-resolved, Lasix discontinued.  -Expected acute blood loss anemia-Hct 29.9%,monitor.  -Nausea with vomiting- resolved, she wants to eat breakfast.    -  Disposition- Requested rehab eval.     LOS: 12 days    Antony Odea, PA-C [phone] 07/19/2019

## 2019-07-19 NOTE — Progress Notes (Addendum)
Advanced Heart Failure Rounding Note   Subjective:    S/p thoracotomy on 2/11.  S/P Greater Omental Flap Closure. Wound vac in place.    Pleural fluid GS +++GPC. Cx grew Strep intermedius, treated w/ penicillin (completed course on 2/20). AF. WBC normal.    On IV Lasix 40 bid (dose reduced over the weekend). - 1.3L in UOP yesterday. SCr stable. No CVP set up currently.   Maintaining NSR w/ frequent PACs on tele. Currently off anticoagulation due to abdominal wall hematoma. Hgb 9.5 today.    Objective:   Weight Range:  Vital Signs:   Temp:  [97.8 F (36.6 C)-98.1 F (36.7 C)] 98.1 F (36.7 C) (02/22 1115) Pulse Rate:  [54-84] 84 (02/22 1115) Resp:  [0-30] 18 (02/22 1115) BP: (98-120)/(27-72) 104/69 (02/22 1115) SpO2:  [95 %-100 %] 98 % (02/22 1115) Weight:  [64.2 kg] 64.2 kg (02/22 0314) Last BM Date: 07/18/19  Weight change: Filed Weights   07/16/19 1341 07/18/19 0400 07/19/19 0314  Weight: 68.8 kg 69.5 kg 64.2 kg    Intake/Output:   Intake/Output Summary (Last 24 hours) at 07/19/2019 1136 Last data filed at 07/19/2019 1122 Gross per 24 hour  Intake 365 ml  Output 2106 ml  Net -1741 ml     Physical Exam: General:  Thin/weak appearing WF. No respiratory difficulty HEENT: normal Neck: supple. no JVD. Carotids 2+ bilat; no bruits. No lymphadenopathy or thyromegaly appreciated. Cor/ Chest: PMI nondisplaced. Irregular rhythm (PACs), regular rate No rubs, gallops or murmurs. + CTs, wound vac in place  Lungs: clear dull at left abse Abdomen: soft, nontender, nondistended. No hepatosplenomegaly. No bruits or masses. Good bowel sounds.  Extremities: no cyanosis, clubbing, rash, trace bilateral LEE edema +RUE picc Neuro: alert & oriented x 3, cranial nerves grossly intact. moves all 4 extremities w/o difficulty. Affect pleasant   Telemetry: NSR w/ frequent PACs 80-90s personally reviewed.    Labs: Basic Metabolic Panel: Recent Labs  Lab 07/15/19 0425  07/15/19 0425 07/16/19 0430 07/16/19 0430 07/17/19 0446 07/18/19 0535 07/18/19 1042 07/19/19 0452  NA 137  --  138  --  137 134*  --  138  K 3.9   < > 3.6  --  4.4 7.4* 5.1 4.4  CL 101  --  97*  --  96* 94*  --  94*  CO2 29  --  33*  --  34* 34*  --  36*  GLUCOSE 93  --  85  --  96 186*  --  79  BUN 13  --  10  --  9 10  --  13  CREATININE 0.77  --  0.65  --  0.74 0.92  --  0.75  CALCIUM 7.5*   < > 7.5*   < > 7.6* 7.6*  --  8.0*  MG  --   --  1.5*  --   --   --   --  1.9   < > = values in this interval not displayed.    Liver Function Tests: Recent Labs  Lab 07/13/19 0520 07/14/19 0502 07/15/19 0425 07/16/19 0430  AST 20 20 15  14*  ALT 15 15 14 12   ALKPHOS 45 47 46 52  BILITOT 0.6 0.6 0.6 0.2*  PROT 4.6* 4.6* 4.3* 4.3*  ALBUMIN 1.6* 1.7* 1.7* 1.6*   No results for input(s): LIPASE, AMYLASE in the last 168 hours. No results for input(s): AMMONIA in the last 168 hours.  CBC: Recent Labs  Lab  07/15/19 0425 07/16/19 0430 07/17/19 0446 07/18/19 0535 07/19/19 0452  WBC 9.7 10.4 9.2 7.6 6.9  HGB 10.1* 10.3* 10.5* 10.4* 9.5*  HCT 31.2* 32.6* 32.0* 32.1* 29.9*  MCV 91.0 93.1 91.4 93.0 94.3  PLT 217 248 266 268 252    Cardiac Enzymes: No results for input(s): CKTOTAL, CKMB, CKMBINDEX, TROPONINI in the last 168 hours.  BNP: BNP (last 3 results) No results for input(s): BNP in the last 8760 hours.  ProBNP (last 3 results) No results for input(s): PROBNP in the last 8760 hours.    Other results:  Imaging: DG Chest Port 1 View  Result Date: 07/18/2019 CLINICAL DATA:  Pleural effusion on the right EXAM: PORTABLE CHEST 1 VIEW COMPARISON:  Yesterday FINDINGS: Chest tubes and left IJ catheter in stable position. Unchanged left base opacity and probable loculated gas. No definite or enlarging apical pneumothorax is seen. Prominence of the right hilum which appears vascular on recent chest CT. Cardiomegaly. IMPRESSION: Stable residual opacity at the postoperative left  base. Electronically Signed   By: Monte Fantasia M.D.   On: 07/18/2019 06:37     Medications:     Scheduled Medications: . celecoxib  200 mg Per Tube BID  . chlorhexidine gluconate (MEDLINE KIT)  15 mL Mouth Rinse BID  . Chlorhexidine Gluconate Cloth  6 each Topical Daily  . furosemide  40 mg Intravenous BID  . Gerhardt's butt cream   Topical BID  . mometasone-formoterol  2 puff Inhalation BID  . montelukast  10 mg Per Tube Daily  . pantoprazole  40 mg Oral Daily    Infusions: . sodium chloride 20.8 mL/hr at 07/17/19 0641  . 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 Wound vac in place -S/P 2/16 Greater Omental Flap Closure CT x2 + wound vac - GS with +++GPC. Culture strep intermedius--> treated w/  Penicillin - off abx. AF. WCB nl.  - TCTS managing.  2. PAH - very mild by cath PVR < 1.0 - Sildenafil off with low BP.  - Restart once improved.   3. PAF -Maintaining NSR w/ frequent PACs.   - off amio gtt. ? Starting PO - anticoagulation on hold due to abdominal wall hematoma. Hgb 9.5 today.  - resume Eliquis once cleared by CT surgery    4. Volume overload -LVEF normal RV normal.  -Overall improved w/ diuresis and below preop wt (although ? If today's wt is accurate) -No CVP set up but appears to be nearing euvolemia. SCr stable. Continue IV Lasix today and reassess in the am.  5. Hypokalemia/Hypomagnesemia - K 4.4 today  - Follow w/ diuresis   6. Severe protein calorie malnutrition -Albumin 1.7 - Passed speech eval.  - will get Nutrition consult  7. Anemia  - Hgb 9.5 - off  Heparin per above   8. Severe Deconditioning - PT following. Recommending SNF.   9. Hypomagnesemia  - improved, Mg 1.9 today    Length of Stay: [place] PA-C 07/19/2019, 11:36 AM  Advanced Heart Failure Team Pager [phone] (M-F; [place])  Please contact [facility] for night-coverage after hours (4p -7a ) and weekends on [email or web address]   Patient seen and examined with the above-signed Advanced Practice Provider and/or Housestaff. I personally reviewed laboratory data, imaging studies and relevant notes. I independently examined the  patient and formulated the important aspects of the plan. I have edited the note to reflect any of my changes or salient points. I have personally discussed the plan with the patient and/or family.  She is improving slowly. Wound vac site ok. Volume status improved. Remains in NSR with frequent PACs. Will start po amio 200 bid. Start Eliquis when ok with TCTS. Keep SCDs on.   Continue abx. Hold diuretics.   Glori Bickers, MD  3:08 PM

## 2019-07-20 ENCOUNTER — Inpatient Hospital Stay (HOSPITAL_COMMUNITY): Payer: Medicare Other

## 2019-07-20 LAB — CBC
HCT: 30.8 % — ABNORMAL LOW (ref 36.0–46.0)
Hemoglobin: 9.7 g/dL — ABNORMAL LOW (ref 12.0–15.0)
MCH: 30.2 pg (ref 26.0–34.0)
MCHC: 31.5 g/dL (ref 30.0–36.0)
MCV: 96 fL (ref 80.0–100.0)
Platelets: 270 10*3/uL (ref 150–400)
RBC: 3.21 MIL/uL — ABNORMAL LOW (ref 3.87–5.11)
RDW: 21 % — ABNORMAL HIGH (ref 11.5–15.5)
WBC: 8.3 10*3/uL (ref 4.0–10.5)
nRBC: 0 % (ref 0.0–0.2)

## 2019-07-20 LAB — BASIC METABOLIC PANEL
Anion gap: 7 (ref 5–15)
BUN: 15 mg/dL (ref 8–23)
CO2: 37 mmol/L — ABNORMAL HIGH (ref 22–32)
Calcium: 7.9 mg/dL — ABNORMAL LOW (ref 8.9–10.3)
Chloride: 93 mmol/L — ABNORMAL LOW (ref 98–111)
Creatinine, Ser: 0.85 mg/dL (ref 0.44–1.00)
GFR calc Af Amer: >60 mL/min (ref >60)
GFR calc non Af Amer: >60 mL/min (ref >60)
Glucose, Bld: 83 mg/dL (ref 70–99)
Potassium: 4.1 mmol/L (ref 3.5–5.1)
Sodium: 137 mmol/L (ref 135–145)

## 2019-07-20 LAB — MAGNESIUM: Magnesium: 1.8 mg/dL (ref 1.7–2.4)

## 2019-07-20 MED ORDER — MAGNESIUM SULFATE 2 GM/50ML IV SOLN
2.0000 g | Freq: Once | INTRAVENOUS | Status: AC
  Administered 2019-07-20: 12:00:00 2 g via INTRAVENOUS
  Filled 2019-07-20: qty 50

## 2019-07-20 MED ORDER — AMIODARONE HCL 200 MG PO TABS
200.0000 mg | ORAL_TABLET | Freq: Two times a day (BID) | ORAL | Status: DC
  Administered 2019-07-20 – 2019-07-24 (×9): 200 mg via ORAL
  Filled 2019-07-20 (×9): qty 1

## 2019-07-20 NOTE — Progress Notes (Signed)
7 Days Post-Op Procedure(s) (LRB): GREATER OMENTAL FLAP CLOSURE (Left) CLOSURE OF THORACOTOMY (Left) APPLICATION OF WOUND VAC USING PREVENA DRESSING (Left) Chest Tube Insertion (Right) Vascular Assessment With Spy Elite (Left) Subjective: Alert and says she is feeling better. Eating breakfast now.  No new complaints. Denies nausea  Objective: Vital signs in last 24 hours: Temp:  [97.7 F (36.5 C)-98.5 F (36.9 C)] 98.1 F (36.7 C) (02/23 0748) Pulse Rate:  [60-85] 84 (02/23 0759) Cardiac Rhythm: Normal sinus rhythm (02/23 0753) Resp:  [17-23] 19 (02/23 0759) BP: (103-139)/(63-80) 103/75 (02/23 0748) SpO2:  [95 %-100 %] 100 % (02/23 0759) Weight:  [63 kg] 63 kg (02/23 0500)    Intake/Output from previous day: 02/22 0701 - 02/23 0700 In: 440 [P.O.:440] Out: 2485 [Urine:1700; Drains:235; Stool:550] Intake/Output this shift: No intake/output data recorded.  Physical Exam: General appearance:alert, cooperative andnodistress Neurologic:no deficits Heart:NSR withrarePAC's Lungs:Breath sounds are clear,225ml serous drainagefrom the left drainage tubes and55ml. Right chest tube removed yesterday.  Abdomen:soft,normalbowel soundstoday,minimalperi-incisional tenderness Extremities:All warm and well perfused.  Wound:The left chest wound is covered with a dry dressing  The midline abdominal incision is dry . The incision is well approximated with staples.    Lab Results: Recent Labs    07/19/19 0452 07/20/19 0500  WBC 6.9 8.3  HGB 9.5* 9.7*  HCT 29.9* 30.8*  PLT 252 270   BMET:  Recent Labs    07/19/19 0452 07/20/19 0500  NA 138 137  K 4.4 4.1  CL 94* 93*  CO2 36* 37*  GLUCOSE 79 83  BUN 13 15  CREATININE 0.75 0.85  CALCIUM 8.0* 7.9*    PT/INR: No results for input(s): LABPROT, INR in the last 72 hours. ABG    Component Value Date/Time   PHART 7.380 07/14/2019 0436   HCO3 27.9 07/14/2019 0436   TCO2 30 07/09/2019 0427   O2SAT 98.2  07/14/2019 0436   CBG (last 3)  No results for input(s): GLUCAP in the last 72 hours.  Assessment/Plan: S/P Procedure(s) (LRB): GREATER OMENTAL FLAP CLOSURE (Left) CLOSURE OF THORACOTOMY (Left) APPLICATION OF WOUND VAC USING PREVENA DRESSING (Left) Chest Tube Insertion (Right) Vascular Assessment With Spy Elite (Left)  78yof with left empyema and subsequent erosion through the left lateral chest wall.  -POD-12 left thoracotomy for drainage of empyema, debridement of the left chest wall wound, and placement of a wound vac. The vac was changed in the OR 3 days later. Initial wound Cx + for Strept intermedius(PCN-sensitive). Completed course of ABX.   -POD7closure of the open left chest wound with greater omental flap and drainage of a right pleural effusion and chest tube placement.  Continue PT.The right chest tube was removed yesterday, left drains connected to bulb suction and continue to have moderate serous drainage.   -Paroxysmal atrial fibrillation-HoldingSR,  oral amiodaroneresumed. Low risk for surgical bleed at this point. OK to anticoagulate.    -Volume overload-diuresing per heart failure  -Expected acute blood loss anemia-Mild, Hct rending up.   -Nausea with vomiting- resolved.   -Disposition- CIR team evaluating for admission.    LOS: 13 days    Connie Jarvis, [place] [phone] 07/20/2019

## 2019-07-20 NOTE — Progress Notes (Signed)
Physical Therapy Treatment Patient Details Name: Connie Jarvis MRN: [id number] DOB: [birth year] Today's Date: 07/20/2019    History of Present Illness 79 y.o. female with medical history significant of O2 dependent COPD. Recently seen by GS for left upper chest wall lesion. S/p I&D and placed on Abx. Patient was to have further debridement of lesion but c/o SOB.  Seen in f/u by pulmonology. Patient found to have a large fungating mass, 8x20cm draining yellow fluid. CT scan of the chest revealed a large empyema measuring 13.4x12.5 cm with compressive atelectasis. Pt underwent thoracotomy with empyema drainage on 2/11.    PT Comments    Patient is making progress toward PT goals and is motivated to participate in therapy. Continue to recommend CIR for further skilled PT services.    Follow Up Recommendations  CIR;Supervision/Assistance - 24 hour     Equipment Recommendations  (defer to post-acute setting)    Recommendations for Other Services       Precautions / Restrictions Precautions Precautions: Fall Precaution Comments: LUQ bulb drains  Restrictions Weight Bearing Restrictions: No    Mobility  Bed Mobility               General bed mobility comments: pt OOB in chair upon arrival  Transfers Overall transfer level: Needs assistance Equipment used: Rolling walker (2 wheeled) Transfers: Sit to/from Stand Sit to Stand: Min assist         General transfer comment: assist to power up into standing from recliner  Ambulation/Gait Ambulation/Gait assistance: Min guard;Min assist Gait Distance (Feet): (75 ft total with 2 seated rest breaks) Assistive device: Rolling walker (2 wheeled) Gait Pattern/deviations: Step-through pattern;Decreased stride length;Narrow base of support;Trunk flexed Gait velocity: reduced   General Gait Details: pt with decreased cadence and impaired balance; assist to steady; cues for safe proximity to RW, upright posture/forward gaze and  PLB   Stairs             Wheelchair Mobility    Modified Rankin (Stroke Patients Only)       Balance Overall balance assessment: Needs assistance Sitting-balance support: Single extremity supported;Feet supported Sitting balance-Leahy Scale: Fair     Standing balance support: Bilateral upper extremity supported Standing balance-Leahy Scale: Poor                              Cognition Arousal/Alertness: Awake/alert Behavior During Therapy: WFL for tasks assessed/performed Overall Cognitive Status: Within Functional Limits for tasks assessed                                        Exercises      General Comments        Pertinent Vitals/Pain Pain Assessment: Faces Faces Pain Scale: Hurts a little bit Pain Location: chest Pain Descriptors / Indicators: Discomfort Pain Intervention(s): Monitored during session    Home Living                      Prior Function            PT Goals (current goals can now be found in the care plan section) Progress towards PT goals: Progressing toward goals    Frequency    Min 3X/week      PT Plan Current plan remains appropriate    Co-evaluation  AM-PAC PT "6 Clicks" Mobility   Outcome Measure  Help needed turning from your back to your side while in a flat bed without using bedrails?: A Little Help needed moving from lying on your back to sitting on the side of a flat bed without using bedrails?: A Little Help needed moving to and from a bed to a chair (including a wheelchair)?: A Little Help needed standing up from a chair using your arms (e.g., wheelchair or bedside chair)?: A Little Help needed to walk in hospital room?: A Little Help needed climbing 3-5 steps with a railing? : Total 6 Click Score: 16    End of Session Equipment Utilized During Treatment: Oxygen Activity Tolerance: Patient tolerated treatment well Patient left: in chair;with call  bell/phone within reach Nurse Communication: Mobility status PT Visit Diagnosis: Unsteadiness on feet (R26.81);Muscle weakness (generalized) (M62.81)     Time: 1525-1600 PT Time Calculation (min) (ACUTE ONLY): 35 min  Charges:  $Gait Training: 23-37 mins                     Earney Navy, PTA Acute Rehabilitation Services Pager: [phone] Office: [phone]     Darliss Cheney 07/20/2019, 5:02 PM

## 2019-07-20 NOTE — Progress Notes (Addendum)
Advanced Heart Failure Rounding Note   Subjective:    S/p thoracotomy on 2/11.  S/P Greater Omental Flap Closure. Wound vac in place.    Pleural fluid GS +++GPC. Cx grew Strep intermedius, treated w/ penicillin (completed course on 2/20). AF. WBC normal.    On IV Lasix 40 bid. - 1.7L in UOP yesterday. SCr stable. No CVP set up currently.   Maintaining NSR w/ frequent PACs on tele. Currently off anticoagulation due to abdominal wall hematoma. Hgb 9.7 today.    Objective:   Weight Range:  Vital Signs:   Temp:  [97.7 F (36.5 C)-98.5 F (36.9 C)] 97.8 F (36.6 C) (02/23 1036) Pulse Rate:  [60-85] 79 (02/23 1036) Resp:  [14-23] 14 (02/23 1036) BP: (103-139)/(62-80) 116/62 (02/23 1036) SpO2:  [95 %-100 %] 99 % (02/23 1036) Weight:  [63 kg] 63 kg (02/23 0500) Last BM Date: 07/18/19  Weight change: Filed Weights   07/18/19 0400 07/19/19 0314 07/20/19 0500  Weight: 69.5 kg 64.2 kg 63 kg    Intake/Output:   Intake/Output Summary (Last 24 hours) at 07/20/2019 1103 Last data filed at 07/20/2019 0700 Gross per 24 hour  Intake 440 ml  Output 2485 ml  Net -2045 ml     Physical Exam: General:  Thin/frail appearing WF. No respiratory difficulty HEENT: normal anicteric  Neck: supple. no JVD. Carotids 2+ bilat; no bruits. No lymphadenopathy or thyromegaly appreciated. Cor/ Chest: PMI nondisplaced. Irregular rhythm (PACs), regular rate No rubs, gallops or murmurs. Left sided CT w/ drain  Wound vac ok.  Lungs: clear dull at left base w/ faint crackles  Abdomen: soft, nontender, nondistended. No hepatosplenomegaly. No bruits or masses. Good bowel sounds.  Extremities: no cyanosis, clubbing, rash, trace bilateral LEE edema diabetic educator   Telemetry: NSR w/ frequent PACs 80-90s personally reviewed.    Labs: Basic Metabolic Panel: Recent Labs  Lab 07/16/19 0430 07/16/19 0430 07/17/19 0446 07/17/19 0446 07/18/19 0535 07/18/19 1042 07/19/19 0452 07/20/19 0500  NA  138  --  137  --  134*  --  138 137  K 3.6   < > 4.4  --  7.4* 5.1 4.4 4.1  CL 97*  --  96*  --  94*  --  94* 93*  CO2 33*  --  34*  --  34*  --  36* 37*  GLUCOSE 85  --  96  --  186*  --  79 83  BUN 10  --  9  --  10  --  13 15  CREATININE 0.65  --  0.74  --  0.92  --  0.75 0.85  CALCIUM 7.5*   < > 7.6*   < > 7.6*  --  8.0* 7.9*  MG 1.5*  --   --   --   --   --  1.9 1.8   < > = values in this interval not displayed.    Liver Function Tests: Recent Labs  Lab 07/14/19 0502 07/15/19 0425 07/16/19 0430  AST 20 15 14*  ALT _0 ALKPHOS 47 46 52  BILITOT 0.6 0.6 0.2*  PROT 4.6* 4.3* 4.3*  ALBUMIN 1.7* 1.7* 1.6*   No results for input(s): LIPASE, AMYLASE in the last 168 hours. No results for input(s): AMMONIA in the last 168 hours.  CBC: Recent Labs  Lab 07/16/19 0430 07/17/19 0446 07/18/19 0535 07/19/19 0452 07/20/19 0500  WBC 10.4 9.2 7.6 6.9 8.3  HGB 10.3* 10.5* 10.4* 9.5* 9.7*  HCT 32.6* 32.0* 32.1* 29.9* 30.8*  MCV 93.1 91.4 93.0 94.3 96.0  PLT 248 266 268 252 270    Cardiac Enzymes: No results for input(s): CKTOTAL, CKMB, CKMBINDEX, TROPONINI in the last 168 hours.  BNP: BNP (last 3 results) No results for input(s): BNP in the last 8760 hours.  ProBNP (last 3 results) No results for input(s): PROBNP in the last 8760 hours.    Other results:  Imaging: No results found.   Medications:     Scheduled Medications: . celecoxib  200 mg Per Tube BID  . chlorhexidine gluconate (MEDLINE KIT)  15 mL Mouth Rinse BID  . Chlorhexidine Gluconate Cloth  6 each Topical Daily  . feeding supplement (ENSURE ENLIVE)  237 mL Oral TID BM  . furosemide  40 mg Intravenous BID  . Gerhardt's butt cream   Topical BID  . mometasone-formoterol  2 puff Inhalation BID  . montelukast  10 mg Per Tube Daily  . multivitamin with minerals  1 tablet Oral Daily  . pantoprazole  40 mg Oral Daily    Infusions: . sodium chloride 20.8 mL/hr at 07/17/19 0641  . 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 Wound vac in place -S/P 2/16 Greater Omental Flap Closure CT x2 + wound vac - GS with +++GPC. Culture strep intermedius--> treated w/  Penicillin. Completed 12 days of abx, now off due to nausea. Continue to monitor closely per conversation w/ CT surgery.  AF. WCB nl.  - TCTS managing.  2. PAH - very mild by cath PVR < 1.0 - Sildenafil off with low BP.  - Restart once improved.   3. PAF - Maintaining NSR w/ frequent PACs.   - start PO amiodarone 200 bid - anticoagulation on hold due to abdominal wall hematoma. Hgb 9.7 today.  - resume Eliquis once cleared by CT surgery    4. Volume overload -LVEF normal RV normal.  -Overall improved w/ diuresis and below preop wt  -No CVP set up but appears euvolemia. SCr stable. Stop IV Lasix -Plan PO diuretics tomorrow.   5. Hypokalemia/Hypomagnesemia - K 4.1 today  - Mg 1.8. will give 2 g MgSO4  6. Severe protein calorie malnutrition -Albumin 1.7 - Passed speech eval.  - will get Nutrition consult  7. Anemia  - Hgb 9.4 - off Heparin per above   8. Severe Deconditioning - PT following. Recommending SNF.     Length of Stay: [address] Ladoris Gene 07/20/2019, 11:03 AM  Advanced Heart Failure Team Pager [phone] (M-F; 7a - 4p)  Please contact [facility] for night-coverage after hours (4p -7a ) and weekends on [email or web address]    Patient seen and examined with the above-signed Advanced Practice Provider and/or Housestaff. I personally reviewed laboratory data, imaging studies and relevant notes. I independently examined the patient and formulated the important aspects of the plan. I have edited the note to reflect any of my changes or salient points. I have personally discussed the  plan with the patient and/or family.  Continues to work with PT and is doing better. Wound vac stable. No fevers or chills. Now off abx. Volume status much improved. Agree with stopping lasix. Supp Mag. In NSR. Switch to po amio. Wil restart Eliquis when ok with TCTS.   Glori Bickers, MD  1:50 PM

## 2019-07-20 NOTE — Progress Notes (Signed)
Inpatient Rehab Admissions Coordinator:   Met with pt at bedside.  Note R chest tube out, L to bulb suction.  Plan for transition to PO lasix tomorrow.  Will f/u for possible admission later this week pending functional status, medical stability, and bed availability.   Shann Medal, PT, DPT Admissions Coordinator [phone] 07/20/19  12:36 PM

## 2019-07-21 LAB — BASIC METABOLIC PANEL
Anion gap: 8 (ref 5–15)
BUN: 15 mg/dL (ref 8–23)
CO2: 37 mmol/L — ABNORMAL HIGH (ref 22–32)
Calcium: 8 mg/dL — ABNORMAL LOW (ref 8.9–10.3)
Chloride: 93 mmol/L — ABNORMAL LOW (ref 98–111)
Creatinine, Ser: 0.66 mg/dL (ref 0.44–1.00)
GFR calc Af Amer: >60 mL/min (ref >60)
GFR calc non Af Amer: >60 mL/min (ref >60)
Glucose, Bld: 124 mg/dL — ABNORMAL HIGH (ref 70–99)
Potassium: 4.1 mmol/L (ref 3.5–5.1)
Sodium: 138 mmol/L (ref 135–145)

## 2019-07-21 LAB — BRAIN NATRIURETIC PEPTIDE: B Natriuretic Peptide: 721.3 pg/mL — ABNORMAL HIGH (ref 0.0–100.0)

## 2019-07-21 MED ORDER — FUROSEMIDE 40 MG PO TABS: 40.0000 mg | ORAL_TABLET | Freq: Every day | ORAL | Status: DC

## 2019-07-21 MED ORDER — APIXABAN 5 MG PO TABS
5.0000 mg | ORAL_TABLET | Freq: Two times a day (BID) | ORAL | Status: DC
  Administered 2019-07-22 – 2019-07-24 (×6): 5 mg via ORAL
  Filled 2019-07-21 (×6): qty 1

## 2019-07-21 NOTE — Progress Notes (Signed)
8 Days Post-Op Procedure(s) (LRB): GREATER OMENTAL FLAP CLOSURE (Left) CLOSURE OF THORACOTOMY (Left) APPLICATION OF WOUND VAC USING PREVENA DRESSING (Left) Chest Tube Insertion (Right) Vascular Assessment With Spy Elite (Left) Subjective: Awake and alert, continues to make progress. Tolerating advanced diet.  Mobility improving.  No new concerns.   Objective: Vital signs in last 24 hours: Temp:  [97.8 F (36.6 C)-98.7 F (37.1 C)] 98.7 F (37.1 C) (02/24 0816) Pulse Rate:  [70-79] 77 (02/24 0816) Cardiac Rhythm: Normal sinus rhythm (02/23 1902) Resp:  [13-30] 20 (02/24 0816) BP: (108-140)/(46-77) 111/62 (02/24 0816) SpO2:  [96 %-100 %] 100 % (02/24 0816) Weight:  [61.2 kg] 61.2 kg (02/24 0259)    Intake/Output from previous day: 02/23 0701 - 02/24 0700 In: 914 [P.O.:714; IV Piggyback:50] Out: 575 [Urine:500; Drains:75] Intake/Output this shift: No intake/output data recorded.  Physical Exam: General appearance:alert, cooperative andnodistress Neurologic:no deficits Heart:NSR  Lungs:Breath sounds are clear,40ml serous drainagefrom the left drainage tubes over the past 24 hours. Abdomen:soft,normalbowel soundstoday,minimalperi-incisional tenderness Extremities:All warm and well perfused.  Wound:The left chest wound is covered with adrydressingThemidline abdominal incision is dry . The incision is well approximated with staples.   Lab Results: Recent Labs    07/19/19 0452 07/20/19 0500  WBC 6.9 8.3  HGB 9.5* 9.7*  HCT 29.9* 30.8*  PLT 252 270   BMET:  Recent Labs    07/19/19 0452 07/20/19 0500  NA 138 137  K 4.4 4.1  CL 94* 93*  CO2 36* 37*  GLUCOSE 79 83  BUN 13 15  CREATININE 0.75 0.85  CALCIUM 8.0* 7.9*    PT/INR: No results for input(s): LABPROT, INR in the last 72 hours. ABG    Component Value Date/Time   PHART 7.380 07/14/2019 0436   HCO3 27.9 07/14/2019 0436   TCO2 30 07/09/2019 0427   O2SAT 98.2 07/14/2019 0436   CBG  (last 3)  No results for input(s): GLUCAP in the last 72 hours.  Assessment/Plan: S/P Procedure(s) (LRB): GREATER OMENTAL FLAP CLOSURE (Left) CLOSURE OF THORACOTOMY (Left) APPLICATION OF WOUND VAC USING PREVENA DRESSING (Left) Chest Tube Insertion (Right) Vascular Assessment With Spy Elite (Left)  78yof with left empyema and subsequent erosion through the left lateral chest wall.  -POD-13left thoracotomy for drainage of empyema, debridement of the left chest wall wound, and placement of a wound vac. The vac was changed in the OR 3 days later. Initial wound Cx + for Strept intermedius(PCN-sensitive). Completed course of ABX.   -POD-8closure of the open left chest wound with greater omental flap and drainage of a right pleural effusion and chest tube placement. Continue PT.The  left drains are connected to bulb suction and serous drainage is tapering off.   -Paroxysmal atrial fibrillation-HoldingSR onoral amiodarone. Low risk for surgical bleed at this point. OK to anticoagulate.   -Volume overload-appears euvolemic. Lasix discontinued.  -Expected acute blood loss anemia-Mild, Hct has been rending up. No new lab today.    -Nausea with vomiting- resolved.   -Disposition- Ready for transition to CIR when bed available.    LOS: 14 days    Antony Odea, [place] [phone] 07/21/2019

## 2019-07-21 NOTE — Progress Notes (Addendum)
Advanced Heart Failure Rounding Note   Subjective:    S/p thoracotomy on 2/11.  S/P Greater Omental Flap Closure. Wound vac in place.    Pleural fluid GS +++GPC. Cx grew Strep intermedius, treated w/ penicillin (completed course on 2/20).  Afebrile.    Volume improved w/ IV lasix. Down 23 lb from admission wt. No CVP set up.    Maintaining NSR, less frequent PACs. Currently off anticoagulation due to abdominal wall hematoma but hgb has remained stable.    Out of bed and in chair today. Feels that she is getting better day by day. Waiting for CIR bed.   Objective:   Weight Range:  Vital Signs:   Temp:  [97.8 F (36.6 C)-98.7 F (37.1 C)] 98.7 F (37.1 C) (02/24 0816) Pulse Rate:  [70-79] 77 (02/24 0816) Resp:  [13-30] 20 (02/24 0816) BP: (108-140)/(46-77) 111/62 (02/24 0816) SpO2:  [96 %-100 %] 100 % (02/24 0816) Weight:  [61.2 kg] 61.2 kg (02/24 0259) Last BM Date: 07/21/19  Weight change: Filed Weights   07/19/19 0314 07/20/19 0500 07/21/19 0259  Weight: 64.2 kg 63 kg 61.2 kg    Intake/Output:   Intake/Output Summary (Last 24 hours) at 07/21/2019 1326 Last data filed at 07/21/2019 0855 Gross per 24 hour  Intake 627 ml  Output 750 ml  Net -123 ml     Physical Exam: General:  Thin/frail appearing WF. Cheerful. No respiratory difficulty HEENT: normal anicteric Neck: supple. no JVD. + left IJ triple lumen Carotids 2+ bilat; no bruits. No lymphadenopathy or thyromegaly appreciated.  Cor/ Chest: PMI nondisplaced. RRR No rubs, gallops or murmurs. Left sided  drain x 2   Lungs: decreased BS Lt base Abdomen: soft, nontender, nondistended. No hepatosplenomegaly. No bruits or masses. Good bowel sounds.   Telemetry: NSR 80-90s, less PACs personally reviewed.    Labs: Basic Metabolic Panel: Recent Labs  Lab 07/16/19 0430 07/16/19 0430 07/17/19 0446 07/17/19 0446 07/18/19 0535 07/18/19 1042 07/19/19 0452 07/20/19 0500  NA 138  --  137  --  134*  --  138  137  K 3.6   < > 4.4  --  7.4* 5.1 4.4 4.1  CL 97*  --  96*  --  94*  --  94* 93*  CO2 33*  --  34*  --  34*  --  36* 37*  GLUCOSE 85  --  96  --  186*  --  79 83  BUN 10  --  9  --  10  --  13 15  CREATININE 0.65  --  0.74  --  0.92  --  0.75 0.85  CALCIUM 7.5*   < > 7.6*   < > 7.6*  --  8.0* 7.9*  MG 1.5*  --   --   --   --   --  1.9 1.8   < > = values in this interval not displayed.    Liver Function Tests: Recent Labs  Lab 07/15/19 0425 07/16/19 0430  AST 15 14*  ALT 14 12  ALKPHOS 46 52  BILITOT 0.6 0.2*  PROT 4.3* 4.3*  ALBUMIN 1.7* 1.6*   No results for input(s): LIPASE, AMYLASE in the last 168 hours. No results for input(s): AMMONIA in the last 168 hours.  CBC: Recent Labs  Lab 07/16/19 0430 07/17/19 0446 07/18/19 0535 07/19/19 0452 07/20/19 0500  WBC 10.4 9.2 7.6 6.9 8.3  HGB 10.3* 10.5* 10.4* 9.5* 9.7*  HCT 32.6* 32.0* 32.1* 29.9*  30.8*  MCV 93.1 91.4 93.0 94.3 96.0  PLT 248 266 268 252 270    Cardiac Enzymes: No results for input(s): CKTOTAL, CKMB, CKMBINDEX, TROPONINI in the last 168 hours.  BNP: BNP (last 3 results) No results for input(s): BNP in the last 8760 hours.  ProBNP (last 3 results) No results for input(s): PROBNP in the last 8760 hours.    Other results:  Imaging: DG Chest 2 View  Result Date: 07/20/2019 CLINICAL DATA:  Status post right chest tube removal. EXAM: CHEST - 2 VIEW COMPARISON:  07/18/2019 FINDINGS: A left jugular catheter terminates near the superior cavoatrial junction, unchanged. 2 left-sided chest tubes are unchanged with left chest wall skin staples remaining in place. The right-sided chest tube has been removed. The cardiomediastinal silhouette is unchanged with mild cardiomegaly and right hilar prominence. There is persistent heterogeneous opacity at the left lung base with a small left pleural effusion noted. Heterogeneous lucency in the left lung base on the prior study is less conspicuous on today's examination,  however a small left pneumothorax is likely present including likely anteriorly on the lateral radiograph. No right-sided pneumothorax is identified. IMPRESSION: 1. Interval right chest tube removal without evidence of a right pneumothorax. 2. Postoperative changes in the left lung base with a small left pleural effusion and suspected small left pneumothorax. Electronically Signed   By: Logan Bores M.D.   On: 07/20/2019 11:20     Medications:     Scheduled Medications: . amiodarone  200 mg Oral BID  . celecoxib  200 mg Per Tube BID  . chlorhexidine gluconate (MEDLINE KIT)  15 mL Mouth Rinse BID  . Chlorhexidine Gluconate Cloth  6 each Topical Daily  . feeding supplement (ENSURE ENLIVE)  237 mL Oral TID BM  . Gerhardt's butt cream   Topical BID  . mometasone-formoterol  2 puff Inhalation BID  . montelukast  10 mg Per Tube Daily  . multivitamin with minerals  1 tablet Oral Daily  . pantoprazole  40 mg Oral Daily    Infusions: . sodium chloride 20.8 mL/hr at 07/17/19 0641  . 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 Wound vac in place -S/P 2/16 Greater Omental Flap Closure CT x2 + wound vac - GS with +++GPC. Culture strep intermedius--> treated w/  Penicillin. Completed 12 days of abx, now off due to nausea. Continue to monitor closely per conversation w/ CT surgery.  AF. WCB nl.  - TCTS managing.  2. PAH - very mild by cath PVR < 1.0 - Sildenafil off with low BP.  - Restart once improved.   3. PAF - Maintaining NSR   - continue amiodarone 200 bid - anticoagulation held due to abdominal wall hematoma. Hgb remains stable. Per CT surgery, ok to resume a/c. - Start Eliquis 5 mg bid.    4. Volume overload -LVEF normal. RV normal.  -Overall improved w/ diuresis  and below preop wt  -No CVP set up but appears euvolemic. Check BNP -Check BMP. If renal function stable, can start low dose PO Lasix tomorrow.   5. Hypokalemia/Hypomagnesemia - check BMP   6. Severe protein calorie malnutrition -Albumin 1.7 - Passed speech eval.  - will get Nutrition consult  7. Anemia  - Hgb 9.4 yesterday (stable) - start Eliquis 5 mg bid for afib - follow CBC  8. Severe Deconditioning - PT following. Plan CIR when bed available.     Length of Stay: [address] Ladoris Gene 07/21/2019, 1:26 PM  Advanced Heart Failure Team Pager [phone] (M-F; 7a - 4p)  Please contact [facility] for night-coverage after hours (4p -7a ) and weekends on [email or web address]   Patient seen and examined with the above-signed Advanced Practice Provider and/or Housestaff. I personally reviewed laboratory data, imaging studies and relevant notes. I independently examined the patient and formulated the important aspects of the plan. I have edited the note to reflect any of my changes or salient points. I have personally discussed the plan with the patient and/or family.  She continues to improve. Remains in NSR on po amio. Will restart Eliquis today. Volume looks good. Now off diuretics. She is ready for CIR.   Glori Bickers, MD  4:51 PM

## 2019-07-21 NOTE — Progress Notes (Signed)
Inpatient Rehab Admissions Coordinator:   I do not have a bed available for this patient today.  Will f/u in the next few days with bed availability.   Shann Medal, PT, DPT Admissions Coordinator [phone] 07/21/19  11:11 AM

## 2019-07-22 LAB — CBC
HCT: 30.5 % — ABNORMAL LOW (ref 36.0–46.0)
Hemoglobin: 9.4 g/dL — ABNORMAL LOW (ref 12.0–15.0)
MCH: 30 pg (ref 26.0–34.0)
MCHC: 30.8 g/dL (ref 30.0–36.0)
MCV: 97.4 fL (ref 80.0–100.0)
Platelets: 298 10*3/uL (ref 150–400)
RBC: 3.13 MIL/uL — ABNORMAL LOW (ref 3.87–5.11)
RDW: 21.4 % — ABNORMAL HIGH (ref 11.5–15.5)
WBC: 7.2 10*3/uL (ref 4.0–10.5)
nRBC: 0 % (ref 0.0–0.2)

## 2019-07-22 LAB — BASIC METABOLIC PANEL
Anion gap: 6 (ref 5–15)
BUN: 12 mg/dL (ref 8–23)
CO2: 37 mmol/L — ABNORMAL HIGH (ref 22–32)
Calcium: 8 mg/dL — ABNORMAL LOW (ref 8.9–10.3)
Chloride: 96 mmol/L — ABNORMAL LOW (ref 98–111)
Creatinine, Ser: 0.57 mg/dL (ref 0.44–1.00)
GFR calc Af Amer: >60 mL/min (ref >60)
GFR calc non Af Amer: >60 mL/min (ref >60)
Glucose, Bld: 98 mg/dL (ref 70–99)
Potassium: 4 mmol/L (ref 3.5–5.1)
Sodium: 139 mmol/L (ref 135–145)

## 2019-07-22 MED ORDER — FUROSEMIDE 20 MG PO TABS
20.0000 mg | ORAL_TABLET | Freq: Every day | ORAL | Status: DC
  Administered 2019-07-22 – 2019-07-24 (×3): 20 mg via ORAL
  Filled 2019-07-22 (×3): qty 1

## 2019-07-22 NOTE — Progress Notes (Addendum)
Advanced Heart Failure Rounding Note   Subjective:    S/p thoracotomy on 2/11.  S/P Greater Omental Flap Closure. Drains in place  Pleural fluid GS +++GPC. Cx grew Strep intermedius, treated w/ penicillin (completed course on 2/20).  Afebrile.    Maintaining NSR. PACs less frequent w/ amio. Eliquis started 2/24. Hgb ok, 9.4  Out of bed and in chair today. Eating lunch. Appetite picking up. Still waiting for CIR bed.   Objective:   Weight Range:  Vital Signs:   Temp:  [98 F (36.7 C)-98.4 F (36.9 C)] 98.4 F (36.9 C) (02/25 1107) Pulse Rate:  [75-82] 78 (02/25 1107) Resp:  [13-25] 25 (02/25 1107) BP: (93-151)/(43-81) 151/76 (02/25 1107) SpO2:  [80 %-100 %] 100 % (02/25 0822) Weight:  [60.6 kg] 60.6 kg (02/25 0345) Last BM Date: 07/21/19  Weight change: Filed Weights   07/20/19 0500 07/21/19 0259 07/22/19 0345  Weight: 63 kg 61.2 kg 60.6 kg    Intake/Output:   Intake/Output Summary (Last 24 hours) at 07/22/2019 1136 Last data filed at 07/22/2019 0900 Gross per 24 hour  Intake --  Output 1442 ml  Net -1442 ml     Physical Exam: General:  Thin/frail appearing WF. No respiratory difficulty. OOB in chair HEENT: normal anicteric Neck: supple. no JVD. + left IJ triple lumen Carotids 2+ bilat; no bruits. No lymphadenopathy or thyromegaly appreciated.  Cor/ Chest: PMI nondisplaced. RRR No rubs, gallops or murmurs. Left sided  drain x 2  Lungs: decreased BS Lt base no wheeze Abdomen: soft, nontender, nondistended. No hepatosplenomegaly. No bruits or masses. Good bowel sounds. Extremities: no cyanosis, clubbing, rash, 1+ edema Neuro: alert & oriented x 3, cranial nerves grossly intact. moves all 4 extremities w/o difficulty. Affect pleasant   Telemetry: NSR upper 70s, few PACs personally reviewed.    Labs: Basic Metabolic Panel: Recent Labs  Lab 07/16/19 0430 07/17/19 0446 07/18/19 0535 07/18/19 0535 07/18/19 1042 07/19/19 0452 07/19/19 0452  07/20/19 0500 07/21/19 1330 07/22/19 0530  NA 138   < > 134*  --   --  138  --  137 138 139  K 3.6   < > 7.4*   < > 5.1 4.4  --  4.1 4.1 4.0  CL 97*   < > 94*  --   --  94*  --  93* 93* 96*  CO2 33*   < > 34*  --   --  36*  --  37* 37* 37*  GLUCOSE 85   < > 186*  --   --  79  --  83 124* 98  BUN 10   < > 10  --   --  13  --  15 15 12   CREATININE 0.65   < > 0.92  --   --  0.75  --  0.85 0.66 0.57  CALCIUM 7.5*   < > 7.6*   < >  --  8.0*   < > 7.9* 8.0* 8.0*  MG 1.5*  --   --   --   --  1.9  --  1.8  --   --    < > = values in this interval not displayed.    Liver Function Tests: Recent Labs  Lab 07/16/19 0430  AST 14*  ALT 12  ALKPHOS 52  BILITOT 0.2*  PROT 4.3*  ALBUMIN 1.6*   No results for input(s): LIPASE, AMYLASE in the last 168 hours. No results for input(s): AMMONIA in the  last 168 hours.  CBC: Recent Labs  Lab 07/17/19 0446 07/18/19 0535 07/19/19 0452 07/20/19 0500 07/22/19 0847  WBC 9.2 7.6 6.9 8.3 7.2  HGB 10.5* 10.4* 9.5* 9.7* 9.4*  HCT 32.0* 32.1* 29.9* 30.8* 30.5*  MCV 91.4 93.0 94.3 96.0 97.4  PLT 266 268 252 270 298    Cardiac Enzymes: No results for input(s): CKTOTAL, CKMB, CKMBINDEX, TROPONINI in the last 168 hours.  BNP: BNP (last 3 results) Recent Labs    07/21/19 1400  BNP 721.3*    ProBNP (last 3 results) No results for input(s): PROBNP in the last 8760 hours.    Other results:  Imaging: No results found.   Medications:     Scheduled Medications: . amiodarone  200 mg Oral BID  . apixaban  5 mg Oral BID  . celecoxib  200 mg Per Tube BID  . chlorhexidine gluconate (MEDLINE KIT)  15 mL Mouth Rinse BID  . Chlorhexidine Gluconate Cloth  6 each Topical Daily  . feeding supplement (ENSURE ENLIVE)  237 mL Oral TID BM  . Gerhardt's butt cream   Topical BID  . mometasone-formoterol  2 puff Inhalation BID  . montelukast  10 mg Per Tube Daily  . multivitamin with minerals  1 tablet Oral Daily  . pantoprazole  40 mg Oral Daily     Infusions: . sodium chloride 20.8 mL/hr at 07/17/19 0641  . 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 Wound vac in place -S/P 2/16 Greater Omental Flap Closure CT x2 + wound vac - GS with +++GPC. Culture strep intermedius--> treated w/  Penicillin. Completed 12 days of abx, now off due to nausea. Continue to monitor closely per conversation w/ CT surgery.  AF. WCB nl.  - TCTS managing.  2. PAH - very mild by cath PVR < 1.0 - Sildenafil off with low BP.  - Restart once improved.   3. PAF - Maintaining NSR   - continue amiodarone 200 bid - Continue Eliquis 5 mg bid. Monitor H/H  4. Volume overload -LVEF normal. RV normal.  -Overall improved w/ diuresis, off IV Lasix. 1+ bilateral LEE on exam -Start PO Lasix 20 mg qd. Monitor BMP  5. Hypokalemia - K 4.0 today - Monitor   6. Severe protein calorie malnutrition -Albumin 1.7 - Passed speech eval.  - appetite improving   7. Anemia  - Hgb 9.4 (stable) - Monitor w/ Eliquis   8. Severe Deconditioning - PT following. Plan CIR when bed available.   Length of Stay: [place] PA-C 07/22/2019, 11:36 AM  Advanced Heart Failure Team Pager [phone] (M-F; [place])  Please contact [facility] for night-coverage after hours (4p -7a ) and weekends on [email or web address]  Patient seen and examined with the above-signed Advanced Practice Provider and/or Housestaff. I personally reviewed laboratory data, imaging studies and relevant notes. I independently examined the patient and formulated the important aspects of the plan. I have edited the note to reflect any of my changes or salient points. I have personally discussed the plan with the patient and/or family.  Remains in NSR on po amio. On Eliquis without  bleeding. Wound healing.   Mild LE edema. Weight stable. Ok with low dose lasix.   Glori Bickers, MD  7:25 PM

## 2019-07-22 NOTE — Progress Notes (Signed)
Inpatient Rehabilitation Admissions Coordinator  I do not have a CIR bed to admit patient to today. Noted left drains to bulb suction. When are they anticipated to be able to be discontinued?  Danne Baxter, RN, MSN Rehab Admissions Coordinator [phone] 07/22/2019 10:31 AM

## 2019-07-22 NOTE — Care Management (Signed)
Per Andrey Farmer. W/[facility]  Co-pay amount for Entresto 24-26 Bid for a 30 day supply $46.00,90 day supply bid $138.00.,retail and or mail order.   No PA required Deductible not met. [facility].  Ref# [id number]

## 2019-07-22 NOTE — Progress Notes (Signed)
9 Days Post-Op Procedure(s) (LRB): GREATER OMENTAL FLAP CLOSURE (Left) CLOSURE OF THORACOTOMY (Left) APPLICATION OF WOUND VAC USING PREVENA DRESSING (Left) Chest Tube Insertion (Right) Vascular Assessment With Spy Elite (Left) Subjective: Ms. Christians says she is feeling stronger, appetite is improving. RN reports a stage 2 skin lesion on left buttock that is being treated per protocol.  Objective: Vital signs in last 24 hours: Temp:  [98 F (36.7 C)-98.4 F (36.9 C)] 98.4 F (36.9 C) (02/25 1107) Pulse Rate:  [75-82] 78 (02/25 1107) Cardiac Rhythm: Normal sinus rhythm (02/25 0900) Resp:  [13-25] 25 (02/25 1107) BP: (93-151)/(43-81) 151/76 (02/25 1107) SpO2:  [99 %-100 %] 100 % (02/25 0822) Weight:  [60.6 kg] 60.6 kg (02/25 0345)     Intake/Output from previous day: 02/24 0701 - 02/25 0700 In: -  Out: [id number]; Drains:350] Intake/Output this shift: Total I/O In: -  Out: 92 [Drains:46; Chest Tube:46]  Physical Exam: General appearance:alert, cooperative andnodistress Neurologic:no deficits Heart:NSR  Lungs:Breath sounds are clear,370ml serous fluid from the leftdrainagetubes over the past 24 hours. Abdomen:soft,normalbowel soundstoday,minimalperi-incisional tenderness Extremities:All warm and well perfused.  Wound:The left chest wound is dry, without erythema, and well approximated with staples and sutures. Themidline abdominal incision is dry .The incision is well approximated with staples.  Lab Results: Recent Labs    07/20/19 0500 07/22/19 0847  WBC 8.3 7.2  HGB 9.7* 9.4*  HCT 30.8* 30.5*  PLT 270 298   BMET:  Recent Labs    07/21/19 1330 07/22/19 0530  NA 138 139  K 4.1 4.0  CL 93* 96*  CO2 37* 37*  GLUCOSE 124* 98  BUN 15 12  CREATININE 0.66 0.57  CALCIUM 8.0* 8.0*    PT/INR: No results for input(s): LABPROT, INR in the last 72 hours. ABG    Component Value Date/Time   PHART 7.380 07/14/2019 0436   HCO3 27.9  07/14/2019 0436   TCO2 30 07/09/2019 0427   O2SAT 98.2 07/14/2019 0436   CBG (last 3)  No results for input(s): GLUCAP in the last 72 hours.  Assessment/Plan: S/P Procedure(s) (LRB): GREATER OMENTAL FLAP CLOSURE (Left) CLOSURE OF THORACOTOMY (Left) APPLICATION OF WOUND VAC USING PREVENA DRESSING (Left) Chest Tube Insertion (Right) Vascular Assessment With Spy Elite (Left)  78yof with left empyema and subsequent erosion through the left lateral chest wall.  -POD-14left thoracotomy for drainage of empyema, debridement of the left chest wall wound, and placement of a wound vac. The vac was changed in the OR 3 days later. Initial wound Cx + for Strept intermedius(PCN-sensitive).Completed course of ABX.  -POD-9 closure of the open left chest wound with greater omental flap and drainage of a right pleural effusion and chest tube placement.The  left drains are connected to bulb suction and serous drainage has increased over the past 24 hours. There is no change in character of the drainage. Expect these drains will remain in place for several more days.  -Paroxysmal atrial fibrillation-HoldingSR onoralamiodarone and Eliquis  -Volume overload-mild LE edema. Oral Lasix restarted by HF service.  -Expected acute blood loss anemia-Mild, monitor.  -Nausea with vomiting- resolved.  -Disposition-Ready for transition to CIR when bed available. Will plan to discharge with drains in place.    LOS: 15 days    Antony Odea, [place] [phone] 07/22/2019

## 2019-07-22 NOTE — Progress Notes (Signed)
Physical Therapy Treatment Patient Details Name: Connie Jarvis MRN: [id number] DOB: 11-12-1940 Today's Date: 07/22/2019    History of Present Illness 79 y.o. female with medical history significant of O2 dependent COPD. Recently seen by GS for left upper chest wall lesion. S/p I&D and placed on Abx. Patient was to have further debridement of lesion but c/o SOB.  Seen in f/u by pulmonology. Patient found to have a large fungating mass, 8x20cm draining yellow fluid. CT scan of the chest revealed a large empyema measuring 13.4x12.5 cm with compressive atelectasis. Pt underwent thoracotomy with empyema drainage on 2/11.    PT Comments    Patient continues to make progress toward PT goals. Current plan remains appropriate.    Follow Up Recommendations  CIR;Supervision/Assistance - 24 hour     Equipment Recommendations  (defer to post-acute setting)    Recommendations for Other Services       Precautions / Restrictions Precautions Precautions: Fall Precaution Comments: LUQ bulb drains  Restrictions Weight Bearing Restrictions: No    Mobility  Bed Mobility               General bed mobility comments: pt OOB in chair upon arrival  Transfers Overall transfer level: Needs assistance Equipment used: Rolling walker (2 wheeled) Transfers: Sit to/from Stand Sit to Stand: Min assist;Min guard         General transfer comment: assist to steady upon standing; no assist to power up into standing  Ambulation/Gait Ambulation/Gait assistance: Min guard;Min assist Gait Distance (Feet): 140 Feet Assistive device: Rolling walker (2 wheeled) Gait Pattern/deviations: Step-through pattern;Decreased stride length;Narrow base of support;Trunk flexed Gait velocity: reduced   General Gait Details: cues for upright posture/forward gaze, safe proximity to RW, and PLB; standing breaks due to DOE   [employer]    Modified Rankin (Stroke Patients Only)        Balance Overall balance assessment: Needs assistance Sitting-balance support: Single extremity supported;Feet supported Sitting balance-Leahy Scale: Fair     Standing balance support: Bilateral upper extremity supported Standing balance-Leahy Scale: Poor                              Cognition Arousal/Alertness: Awake/alert Behavior During Therapy: WFL for tasks assessed/performed Overall Cognitive Status: Within Functional Limits for tasks assessed                                        Exercises      General Comments General comments (skin integrity, edema, etc.): pt on 3L O2 via [place] throughout      Pertinent Vitals/Pain Pain Assessment: Faces Faces Pain Scale: No hurt    Home Living                      Prior Function            PT Goals (current goals can now be found in the care plan section) Progress towards PT goals: Progressing toward goals    Frequency    Min 3X/week      PT Plan Current plan remains appropriate    Co-evaluation              AM-PAC PT "6 Clicks" Mobility   Outcome Measure  Help needed turning from your  back to your side while in a flat bed without using bedrails?: A Little Help needed moving from lying on your back to sitting on the side of a flat bed without using bedrails?: A Little Help needed moving to and from a bed to a chair (including a wheelchair)?: A Little Help needed standing up from a chair using your arms (e.g., wheelchair or bedside chair)?: A Little Help needed to walk in hospital room?: A Little Help needed climbing 3-5 steps with a railing? : Total 6 Click Score: 16    End of Session Equipment Utilized During Treatment: Oxygen Activity Tolerance: Patient tolerated treatment well Patient left: in chair;with call bell/phone within reach Nurse Communication: Mobility status PT Visit Diagnosis: Unsteadiness on feet (R26.81);Muscle weakness (generalized) (M62.81)      Time: [id number] PT Time Calculation (min) (ACUTE ONLY): 27 min  Charges:  $Gait Training: 23-37 mins                     Earney Navy, PTA Acute Rehabilitation Services Pager: [phone] Office: [phone]     Darliss Cheney 07/22/2019, 10:31 AM

## 2019-07-23 LAB — CBC
HCT: 28.4 % — ABNORMAL LOW (ref 36.0–46.0)
Hemoglobin: 8.9 g/dL — ABNORMAL LOW (ref 12.0–15.0)
MCH: 30.3 pg (ref 26.0–34.0)
MCHC: 31.3 g/dL (ref 30.0–36.0)
MCV: 96.6 fL (ref 80.0–100.0)
Platelets: 289 10*3/uL (ref 150–400)
RBC: 2.94 MIL/uL — ABNORMAL LOW (ref 3.87–5.11)
RDW: 21.5 % — ABNORMAL HIGH (ref 11.5–15.5)
WBC: 7.8 10*3/uL (ref 4.0–10.5)
nRBC: 0 % (ref 0.0–0.2)

## 2019-07-23 MED ORDER — COLCHICINE 0.3 MG HALF TABLET
0.3000 mg | ORAL_TABLET | Freq: Every day | ORAL | Status: DC
  Administered 2019-07-23 – 2019-07-24 (×2): 0.3 mg via ORAL
  Filled 2019-07-23 (×2): qty 1

## 2019-07-23 NOTE — PMR Pre-admission (Signed)
PMR Admission Coordinator Pre-Admission Assessment  Patient: Connie Jarvis is an 79 y.o., female MRN: [id number] DOB: 07/29/1940 Height: 5' (152.4 cm) Weight: 60.1 kg  Insurance Information HMO:    PPO:      PCP:      IPA:      80/20:      OTHER:  PRIMARY: Medicare a and b      Policy#: [id number]      Subscriber: pt Benefits:  Phone #: passport one online     Name: 2/26 Eff. Date: a 08/26/2007 and b 11/25/2007     Deduct: $1484      Out of Pocket Max: none      Life Max: none CIR: 100%      SNF: 20 full days Outpatient: 80%     Co-Pay: 20% Home Health: 100%      Co-Pay: none DME: 80%     Co-Pay: 20% Providers: pt choice  SECONDARY: [employer]      Policy#: [id number]      Subscriber: pt  Medicaid Application Date:       Case Manager:  Disability Application Date:       Case Worker:   The "Data Collection Information Summary" for patients in Inpatient Rehabilitation Facilities with attached "Privacy Act [place] Records" was provided and verbally reviewed with: Patient and Family  Emergency Contact Information Contact Information    Name Relation Home Work Mobile   [place] D  [phone]        Current Medical History  Patient Admitting Diagnosis: Debility  History of Present Illness:: Connie Jarvis is a 79 year old female with history of COPD-oxygen dependent, multiple episodes of bronchitis, depression, HTN, left upper chest lesion s/p I &D who was admitted on 07/07/19 with large empyema with compressive atelectasis. She underwent left and right cath revealing mild PAH prior to being taken to OR the same day for major thoracotomy with drainage of empyema and application of wound VAC by Dr. Julien Girt.  Post op had issues with volume overload as well as A fib with RVR and started on IV heparin. IV amiodarone added for rate control and required norepinephrine for BP support. She was started on Vancomycin and Zosyn added on 2/16 as wound culture positive for streptococcus  intermedius.  She was taken back to OR on 2/14 for wound irrigation and debridement and ultimately underwent greater omental flap closure on 2/16 with placement of Prevena and right chest tube. She has had issues with N/V and KUB negative for ileus but calcified 10 mm gallstone noted in RUQ. Dr. Haroldine Laws following for management of cardiac issues--she has been weaned off IV amiodarone and transitioned to po diuretics. Left chest wall wound with drains in place with moderate serous drainage. She has completed course of antibiotics 2/20 and Eliquis resumed 2/24. Nausea has resolved and intake improving.   Patient[facility] medical [facility] has been reviewed by the rehabilitation admission coordinator and physician.  Past Medical History  Past Medical History:  Diagnosis Date  . Allergy   . Anemia   . Anxiety   . Arthritis   . Cataract    pt. not sure which eye early stage  . Depression   . GERD (gastroesophageal reflux disease)   . Hypertension     Family History   family history is not on file.  Prior Rehab/Hospitalizations Has the patient had prior rehab or hospitalizations prior to admission? Yes  Has the patient had major  surgery during 100 days prior to admission? Yes   Current Medications  Current Facility-Administered Medications:  .  0.45 % sodium chloride infusion, , Intravenous, Continuous, Atkins, Glenice Bow, MD, Last Rate: 20.8 mL/hr at 07/17/19 0641, Rate Verify at 07/17/19 0641 .  0.9 %  sodium chloride infusion, 10 mL/hr, Intravenous, Once, Atkins, Glenice Bow, MD .  acetaminophen (TYLENOL) tablet 500 mg, 500 mg, Per Tube, Q6H PRN, Wonda Olds, MD, 500 mg at 07/18/19 2350 .  amiodarone (PACERONE) tablet 200 mg, 200 mg, Oral, BID, Lyda Jester M, PA-C, 200 mg at 07/23/19 5945 .  apixaban (ELIQUIS) tablet 5 mg, 5 mg, Oral, BID, Orvan Seen, Glenice Bow, MD, 5 mg at 07/23/19 0924 .  bisacodyl (DULCOLAX) EC tablet 10 mg, 10 mg, Oral, Daily PRN, Atkins,  Broadus Z, MD .  celecoxib (CELEBREX) capsule 200 mg, 200 mg, Per Tube, BID, Orvan Seen, Glenice Bow, MD, 200 mg at 07/23/19 0924 .  chlorhexidine gluconate (MEDLINE KIT) (PERIDEX) 0.12 % solution 15 mL, 15 mL, Mouth Rinse, BID, Atkins, Glenice Bow, MD, 15 mL at 07/22/19 2107 .  Chlorhexidine Gluconate Cloth 2 % PADS 6 each, 6 each, Topical, Daily, Wonda Olds, MD, 6 each at 07/23/19 [phone] .  colchicine tablet 0.3 mg, 0.3 mg, Oral, Daily, Orvan Seen, Glenice Bow, MD, 0.3 mg at 07/23/19 1403 .  feeding supplement (ENSURE ENLIVE) (ENSURE ENLIVE) liquid 237 mL, 237 mL, Oral, TID BM, Atkins, Glenice Bow, MD, 237 mL at 07/23/19 1403 .  furosemide (LASIX) tablet 20 mg, 20 mg, Oral, Daily, Lyda Jester M, PA-C, 20 mg at 07/23/19 9244 .  Gerhardt's butt cream, , Topical, BID, Orvan Seen, Glenice Bow, MD, Given at 07/23/19 [phone] .  levalbuterol (XOPENEX) nebulizer solution 0.63 mg, 0.63 mg, Nebulization, Q6H PRN, Atkins, Broadus Z, MD .  loperamide HCl (IMODIUM) 1 MG/7.5ML suspension 1 mg, 1 mg, Oral, PRN, Atkins, Broadus Z, MD .  metoprolol tartrate (LOPRESSOR) injection 2.5 mg, 2.5 mg, Intravenous, Q8H PRN, Atkins, Broadus Z, MD .  mometasone-formoterol (DULERA) 100-5 MCG/ACT inhaler 2 puff, 2 puff, Inhalation, BID, Wonda Olds, MD, 2 puff at 07/23/19 0834 .  montelukast (SINGULAIR) tablet 10 mg, 10 mg, Per Tube, Daily, Orvan Seen, Glenice Bow, MD, 10 mg at 07/23/19 0924 .  multivitamin with minerals tablet 1 tablet, 1 tablet, Oral, Daily, Orvan Seen, Glenice Bow, MD, 1 tablet at 07/23/19 0924 .  ondansetron (ZOFRAN) injection 4 mg, 4 mg, Intravenous, Q6H PRN, Wonda Olds, MD, 4 mg at 07/18/19 0534 .  pantoprazole (PROTONIX) EC tablet 40 mg, 40 mg, Oral, Daily, Orvan Seen, Glenice Bow, MD, 40 mg at 07/23/19 0924 .  promethazine (PHENERGAN) injection 6.25 mg, 6.25 mg, Intravenous, Q8H PRN, Atkins, Broadus Z, MD .  sodium chloride flush (NS) 0.9 % injection 10-40 mL, 10-40 mL, Intracatheter, PRN, Wonda Olds, MD, 10 mL  at 07/20/19 2129 .  traZODone (DESYREL) tablet 25 mg, 25 mg, Per Tube, QHS PRN, Wonda Olds, MD, 25 mg at 07/11/19 2131  Patients Current Diet:  Diet Order            Diet gluten free Room service appropriate? Yes; Fluid consistency: Thin; Fluid restriction: 1800 mL Fluid  Diet effective now              Precautions / Restrictions Precautions Precautions: Fall Precaution Comments: LUQ bulb drains  Restrictions Weight Bearing Restrictions: No RUE Weight Bearing: Non weight bearing   Has the patient had 2 or more falls or a fall with injury in the  past year? No  Prior Activity Level Community (5-7x/wk): Mod I with rollator  Prior Functional Level Self Care: Did the patient need help bathing, dressing, using the toilet or eating? Independent  Indoor Mobility: Did the patient need assistance with walking from room to room (with or without device)? Independent  Stairs: Did the patient need assistance with internal or external stairs (with or without device)? Needed some help  Functional Cognition: Did the patient need help planning regular tasks such as shopping or remembering to take medications? Needed some help  Home Assistive Devices / [place] Devices/Equipment: Gilford Rile (specify type) Home Equipment: Walker - 4 wheels, Grab bars - tub/shower, Grab bars - toilet  Prior Device Use: Indicate devices/aids used by the patient prior to current illness, exacerbation or injury? Walker  Current Functional Level Cognition  Overall Cognitive Status: Within Functional Limits for tasks assessed Orientation Level: Oriented X4    Extremity Assessment (includes Sensation/Coordination)  Upper Extremity Assessment: Generalized weakness  Lower Extremity Assessment: Generalized weakness    ADLs       Mobility  Overal bed mobility: Needs Assistance Bed Mobility: Rolling, Sidelying to Sit Rolling: Supervision Sidelying to sit: Min guard Supine to sit: Min  assist Sit to supine: Mod assist General bed mobility comments: pt OOB in chair upon arrival    Transfers  Overall transfer level: Needs assistance Equipment used: Rolling walker (2 wheeled) Transfers: Sit to/from Stand Sit to Stand: Min assist, Min guard Stand pivot transfers: Mod assist General transfer comment: assist to steady upon standing; no assist to power up into standing    Ambulation / Gait / Stairs / Wheelchair Mobility  Ambulation/Gait Ambulation/Gait assistance: Min guard, Min assist Gait Distance (Feet): 140 Feet Assistive device: Rolling walker (2 wheeled) Gait Pattern/deviations: Step-through pattern, Decreased stride length, Narrow base of support, Trunk flexed General Gait Details: cues for upright posture/forward gaze, safe proximity to RW, and PLB; standing breaks due to DOE Gait velocity: reduced Gait velocity interpretation: <1.8 ft/sec, indicate of risk for recurrent falls    Posture / Balance Dynamic Sitting Balance Sitting balance - Comments: minG-close supervision Balance Overall balance assessment: Needs assistance Sitting-balance support: Single extremity supported, Feet supported Sitting balance-Leahy Scale: Fair Sitting balance - Comments: minG-close supervision Postural control: Posterior lean Standing balance support: Bilateral upper extremity supported Standing balance-Leahy Scale: Poor Standing balance comment: minA with BUE support of RW    Special needs/care consideration BiPAP/CPAP  CPM  Continuous Drip IV  Dialysis          Life Vest  Oxygen on home ) 2 at baseline; room air on 2/26 Special Bed  Trach Size  Wound Vac left lateal upper chest WOUND VAC placed 2/16 Skin left chest incision with 2 JP drains; abdominal incision,; right chest incision; sacral stage 1; buttocks medical stage 2 Bowel mgmt: continent Bladder mgmt: external catheter Diabetic mgmt:  Behavioral consideration  Chemo/radiation  designated visitor is spouse,  Eddie Dibbles   Previous Home Environment  Living Arrangements: Spouse/significant other  Lives With: Spouse Available Help at Discharge: Family, Available 24 hours/day Type of Home: House Home Layout: One level, Laundry or work area in [place] Access: Ramped entrance, Stairs to enter Technical brewer of Steps: 6 Bathroom Shower/Tub: Chiropodist: Handicapped height [place] Accessibility: Yes How Accessible: Accessible via [place]: Yes Type of [place]: [place] (if known): [facility]  Discharge Living Setting Plans for Discharge Living Setting: Patient's home, Lives with (  comment)(spouse) Type of Home at Discharge: House Discharge Home Layout: One level, Laundry or work area in basement Discharge Home Access: Stairs to enter Technical brewer of Steps: 6 Discharge Bathroom Shower/Tub: Tub/shower unit Discharge Bathroom Toilet: Handicapped height Discharge Bathroom Accessibility: Yes How Accessible: Accessible via walker Does the patient have any problems obtaining your medications?: No  Social/Family/Support Systems Patient Roles: Spouse Contact Information: spouse cell [phone] Anticipated Caregiver: spouse Anticipated Caregiver's Contact Information: home [phone]; cell [phone] Ability/Limitations of Caregiver: no limitations Caregiver Availability: 24/7 Discharge Plan Discussed with Primary Caregiver: Yes Is Caregiver In Agreement with Plan?: Yes Does Caregiver/Family have Issues with Lodging/Transportation while Pt is in Rehab?: No  Goals/Additional Needs Patient/Family Goal for Rehab: Mod I to supervision with PT, OT, and SLP Expected length of stay: ELOS 10 to 14 days Equipment Needs: wound vac and JP drains Additional Information: HOH Pt/Family Agrees to Admission and willing to participate: Yes Program Orientation Provided & Reviewed with Pt/Caregiver Including Roles  &  Responsibilities: Yes  Decrease burden of Care through IP rehab admission:   Possible need for SNF placement upon discharge:   Patient Condition: I have reviewed medical records from [facility] , spoken with CM, and patient and spouse. I met with patient at the bedside for inpatient rehabilitation assessment.  Patient will benefit from ongoing PT, OT and SLP, can actively participate in 3 hours of therapy a day 5 days of the week, and can make measurable gains during the admission.  Patient will also benefit from the coordinated team approach during an Inpatient Acute Rehabilitation admission.  The patient will receive intensive therapy as well as Rehabilitation physician, nursing, social worker, and care management interventions.  Due to bladder management, bowel management, safety, skin/wound care, disease management, medication administration, pain management and patient education the patient requires 24 hour a day rehabilitation nursing.  The patient is currently min to mod assist with mobility and basic ADLs.  Discharge setting and therapy post discharge at home with home health is anticipated.  Patient has agreed to participate in the Acute Inpatient Rehabilitation Program and will admit Saturday when bed is available 07/24/2019.  Preadmission Screen Completed By:  Cleatrice Burke, 07/23/2019 4:11 PM ______________________________________________________________________   Discussed status with Dr. Naaman Plummer  on  07/23/2019 at 1600 and received approval for admission Saturday 07/24/2019 when bed is available.  Admission Coordinator:  Cleatrice Burke, RN, time  7371 Date  07/23/2019   Assessment/Plan: Diagnosis: debility after empyema/thoracotomy and associated complications 1. Does the need for close, 24 hr/day Medical supervision in concert with the patient's rehab needs make it unreasonable for this patient to be served in a less intensive setting? Yes 2. Co-Morbidities  requiring supervision/potential complications: COPD, HTN, afib post-op wounds/care 3. Due to bladder management, bowel management, safety, skin/wound care, disease management, medication administration, pain management and patient education, does the patient require 24 hr/day rehab nursing? Yes 4. Does the patient require coordinated care of a physician, rehab nurse, PT, OT, and SLP to address physical and functional deficits in the context of the above medical diagnosis(es)? Yes Addressing deficits in the following areas: balance, endurance, locomotion, strength, transferring, bowel/bladder control, bathing, dressing, feeding, grooming and psychosocial support 5. Can the patient actively participate in an intensive therapy program of at least 3 hrs of therapy 5 days a week? Yes 6. The potential for patient to make measurable gains while on inpatient rehab is excellent 7. Anticipated functional outcomes upon discharge from inpatient rehab: modified independent and  supervision PT, modified independent and supervision OT, modified independent and supervision SLP 8. Estimated rehab length of stay to reach the above functional goals is: 10-14 days 9. Anticipated discharge destination: Home 10. Overall Rehab/Functional Prognosis: excellent   MD Signature: Meredith Staggers, MD, [facility] 07/24/2019

## 2019-07-23 NOTE — Progress Notes (Signed)
Inpatient Rehabilitation Admissions Coordinator  An inpt rehab bed is available for this patient in Saturday. Dr. Naaman Plummer will see patient in the morning for final approval and then the [place] nurse can call rehab charge RN at [phone] to give report and make the arrangements to admit Saturday. I will be making the arrangements.  Danne Baxter, RN, MSN Rehab Admissions Coordinator [phone] 07/23/2019 3:20 PM

## 2019-07-23 NOTE — H&P (Signed)
Physical Medicine and Rehabilitation Admission H&P    CC: Debility   HPI: Connie Jarvis is a 79 year old female with history of COPD-oxygen dependent, multiple episodes of bronchitis, depression, HTN, left upper chest lesion s/p I &D who was admitted on 07/07/19 with large empyema with compressive atelectasis. She underwent left and right cath revealing mild PAH prior to being taken to OR the same day for major thoracotomy with drainage of empyema and application of wound VAC by Dr. Julien Girt.  Post op had issues with volume overload as well as A fib with RVR and started on IV heparin. IV amiodarone added for rate control and required norepinephrine for BP support. She was started on Vancomycin and Zosyn added on 2/16 as wound culture positive for streptococcus intermedius.  She was taken back to OR on 2/14 for wound irrigation and debridement and ultimately underwent greater omental flap closure on 2/16 with placement of Prevena and right chest tube. She has had issues with N/V and KUB negative for ileus but calcified 10 mm gallstone noted in RUQ. Dr. Haroldine Laws following for management of cardiac issues--she has been weaned off IV amiodarone and transitioned to po diuretics. Left chest wall wound with drains in place with moderate serous drainage. She has completed course of antibiotics 2/20 and Eliquis resumed 2/24. Nausea has resolved and intake improving. Patient noted to be debilitated and CIR recommended due to functional deficits.    Review of Systems  Constitutional: Negative for chills and fever.  HENT: Negative for hearing loss and tinnitus.   Eyes: Negative for blurred vision and double vision.  Respiratory: Positive for shortness of breath (with simple activity at home). Negative for cough.   Cardiovascular: Positive for leg swelling. Negative for chest pain and palpitations.  Gastrointestinal: Positive for diarrhea (chronic on and off). Negative for abdominal pain, heartburn and nausea.   Genitourinary: Negative for frequency, hematuria and urgency.  Musculoskeletal: Positive for back pain, joint pain, myalgias and neck pain.       Pain and stiffness  Skin: Positive for itching (near incision ). Negative for rash.  Neurological: Positive for weakness. Negative for dizziness and headaches.  Psychiatric/Behavioral: The patient is not nervous/anxious and does not have insomnia.      Past Medical History:  Diagnosis Date  . Allergy   . Anemia   . Anxiety   . Arthritis   . Cataract    pt. not sure which eye early stage  . Depression   . GERD (gastroesophageal reflux disease)   . Hypertension     Past Surgical History:  Procedure Laterality Date  . APPLICATION OF WOUND VAC Left 07/08/2019   Procedure: APPLICATION OF WOUND VAC;  Surgeon: Wonda Olds, MD;  Location: [facility];  Service: Thoracic;  Laterality: Left;  . APPLICATION OF WOUND VAC Left 07/11/2019   Procedure: WOUND VAC CHANGE AND WOUND IRRIGATION AND SHARP DEBRIDEMENT;  Surgeon: Wonda Olds, MD;  Location: [place];  Service: Thoracic;  Laterality: Left;  . APPLICATION OF WOUND VAC Left 07/13/2019   Procedure: APPLICATION OF WOUND VAC USING PREVENA DRESSING;  Surgeon: Wonda Olds, MD;  Location: [facility];  Service: Thoracic;  Laterality: Left;  . CENTRAL LINE INSERTION Left 07/08/2019   Procedure: CENTRAL LINE INSERTION;  Surgeon: Jolaine Artist, MD;  Location: [facility];  Service: Cardiovascular;  Laterality: Left;  . CHEST TUBE INSERTION Right 07/13/2019   Procedure: Chest Tube Insertion;  Surgeon: Wonda Olds, MD;  Location: [facility];  Service: Thoracic;  Laterality: Right;  . COLONOSCOPY    . EMPYEMA DRAINAGE Left 07/08/2019   Procedure: EMPYEMA DRAINAGE;  Surgeon: Wonda Olds, MD;  Location: [facility];  Service: Thoracic;  Laterality: Left;  . GREATER OMENTAL FLAP CLOSURE Left 07/13/2019   Procedure: GREATER OMENTAL FLAP CLOSURE;  Surgeon: Wonda Olds, MD;  Location: [facility];   Service: Thoracic;  Laterality: Left;  . MOUTH SURGERY    . RIGHT/LEFT HEART CATH AND CORONARY ANGIOGRAPHY N/A 07/08/2019   Procedure: RIGHT/LEFT HEART CATH AND CORONARY ANGIOGRAPHY;  Surgeon: Jolaine Artist, MD;  Location: [place] [facility];  Service: Cardiovascular;  Laterality: N/A;  . THORACOTOMY Left 07/08/2019   Procedure: THORACOTOMY MAJOR;  Surgeon: Wonda Olds, MD;  [facility];  Service: Thoracic;  Laterality: Left;  . THORACOTOMY Left 07/13/2019   Procedure: CLOSURE OF THORACOTOMY;  Surgeon: Wonda Olds, MD;  Location: [facility];  Service: Thoracic;  Laterality: Left;  . TONSILLECTOMY    . TUBAL LIGATION    . VASCULAR ASSESSMENT WITH SPY ELITE Left 07/13/2019   Procedure: Vascular Assessment With Spy Elite;  Surgeon: Wonda Olds, MD;  Location: [facility] OR;  Service: Thoracic;  Laterality: Left;    Family History  Problem Relation Age of Onset  . Colon cancer Neg Hx   . Colon polyps Neg Hx   . Esophageal cancer Neg Hx   . Pancreatic cancer Neg Hx   . Rectal cancer Neg Hx   . Stomach cancer Neg Hx     Social History:  Married. 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  Limited functional status --used rollator. She had started using scooter for the past year when out of home. She reports that she has never smoked. She has never used smokeless tobacco. She reports previous alcohol use. She reports that she does not use drugs.   Allergies  Allergen Reactions  . Codeine Nausea And Vomiting  . Gluten Meal Diarrhea    Abdominal pain and bloating  . Lactose Diarrhea    Bloating and abdominal pain  . Neosporin [Neomycin-Bacitracin Zn-Polymyx]   . Other     Pain medication pt. Cannot remember the name.    Facility-Administered Medications Prior to Admission  Medication Dose Route Frequency Provider Last Rate Last Admin  . 0.9 %  sodium chloride infusion  500 mL Intravenous Once Ladene Artist, MD       Medications Prior to Admission    Medication Sig Dispense Refill  . acetaminophen (TYLENOL) 500 MG tablet Take 500 mg by mouth every 6 (six) hours as needed for mild pain, fever or headache.     . albuterol (VENTOLIN HFA) 108 (90 Base) MCG/ACT inhaler Inhale 2 puffs into the lungs every 4 (four) hours as needed for wheezing or shortness of breath.    Marland Kitchen aspirin EC 81 MG tablet Take 81 mg by mouth daily.    . benzonatate (TESSALON) 200 MG capsule Take 200 mg by mouth 3 (three) times daily as needed for cough.    . budesonide-formoterol (SYMBICORT) 80-4.5 MCG/ACT inhaler Inhale 2 puffs into the lungs 2 (two) times daily.    . Calcium Carbonate-Vitamin D (CALTRATE 600+D) 600-400 MG-UNIT tablet Take 1 tablet by mouth daily.    . celecoxib (CELEBREX) 200 MG capsule Take 200 mg by mouth 2 (two) times daily.  3  . Cholecalciferol (VITAMIN D3) 10000 units TABS Take 1 tablet by mouth daily.     Marland Kitchen  Glucosamine-Chondroit-Vit C-Mn (GLUCOSAMINE-CHONDROITIN MAX ST) CAPS Take 1 capsule by mouth daily.     Marland Kitchen losartan (COZAAR) 25 MG tablet Take 25 mg by mouth daily.  3  . metoprolol succinate (TOPROL-XL) 25 MG 24 hr tablet Take 25 mg by mouth daily.     . montelukast (SINGULAIR) 10 MG tablet Take 10 mg by mouth daily.    . Multiple Vitamin (MULTIVITAMIN) tablet Take 1 tablet by mouth daily.    . Omega-3 Fatty Acids (FISH OIL) 1000 MG CAPS Take 1 capsule by mouth daily.     Marland Kitchen omeprazole (PRILOSEC) 20 MG capsule Take 20 mg by mouth daily.    . sildenafil (REVATIO) 20 MG tablet Take 20 mg by mouth 3 (three) times daily.    . valACYclovir (VALTREX) 1000 MG tablet Take 1,000 mg by mouth as needed.    Marland Kitchen b complex vitamins capsule Take 1 capsule by mouth daily.    . clindamycin (CLEOCIN) 300 MG capsule Take 300 mg by mouth 2 (two) times daily.    . macitentan (OPSUMIT) 10 MG tablet Take 10 mg by mouth daily.      Drug Regimen Review  Drug regimen was reviewed and remains appropriate with no significant issues identified  Home: Home  Living Family/patient expects to be discharged to:: Private residence Living Arrangements: Spouse/significant other Available Help at Discharge: Family, Available 24 hours/day(limited physical assistance) Type of Home: House Home Access: Ramped entrance, Stairs to enter(ramp is very long) Technical brewer of Steps: 6 Home Layout: One level Bathroom Shower/Tub: Chiropodist: Handicapped height Home Equipment: Environmental consultant - 4 wheels, Grab bars - tub/shower, Grab bars - toilet   Functional History: Prior Function Level of Independence: Independent with assistive device(s) Comments: short household distances with rollator, limited by supplemental oxygen tank  Functional Status:  Mobility: Bed Mobility Overal bed mobility: Needs Assistance Bed Mobility: Rolling, Sidelying to Sit Rolling: Supervision Sidelying to sit: Min guard Supine to sit: Min assist Sit to supine: Mod assist General bed mobility comments: pt OOB in chair upon arrival Transfers Overall transfer level: Needs assistance Equipment used: Rolling walker (2 wheeled) Transfers: Sit to/from Stand Sit to Stand: Min assist, Min guard Stand pivot transfers: Mod assist General transfer comment: assist to steady upon standing; no assist to power up into standing Ambulation/Gait Ambulation/Gait assistance: Min guard, Min assist Gait Distance (Feet): 140 Feet Assistive device: Rolling walker (2 wheeled) Gait Pattern/deviations: Step-through pattern, Decreased stride length, Narrow base of support, Trunk flexed General Gait Details: cues for upright posture/forward gaze, safe proximity to RW, and PLB; standing breaks due to DOE Gait velocity: reduced Gait velocity interpretation: <1.8 ft/sec, indicate of risk for recurrent falls    ADL:    Cognition: Cognition Overall Cognitive Status: Within Functional Limits for tasks assessed Orientation Level: Oriented X4 Cognition Arousal/Alertness:  Awake/alert Behavior During Therapy: WFL for tasks assessed/performed Overall Cognitive Status: Within Functional Limits for tasks assessed   Blood pressure (!) 104/44, pulse 82, temperature 97.9 F (36.6 C), temperature source Oral, resp. rate (!) 24, height 5' (1.524 m), weight 60.1 kg, SpO2 96 %. Physical Exam  Nursing note and vitals reviewed. Constitutional: She is oriented to person, place, and time. She appears well-developed. No distress.  Frail appearing  HENT:  Head: Normocephalic and atraumatic.  Eyes: Pupils are equal, round, and reactive to light. EOM are normal.  Neck:  Central line area dressed  Cardiovascular: Normal rate and regular rhythm. Exam reveals no friction rub.  No murmur heard.  Respiratory: Effort normal. No respiratory distress. She has decreased breath sounds in the left middle field and the left lower field. She has no wheezes.  Left lateral chest wall incision intact with staples and 2 JP drains in place with serous drainage produced from both. JP sites clean. Decreased sounds at bases.  GI: Soft. She exhibits no distension. There is no abdominal tenderness.  Midline incision with dry dressing with old,bloody drainage, staples well approximated  Musculoskeletal:        General: Edema (2+ pitting edema bilateral shins) present.     Cervical back: Normal range of motion.     Comments: Bilateral lower extremity edema 1 to 2+. Pes planus deformities bilaterally R>L  Neurological: She is alert and oriented to person, place, and time.  Dysphonia noted--mild. More prominent when she fatigues. Otherwise CN intact. UE motor 4/5 proximal to distal. LE: 3/5 HF, KE and 2-3/5 ADF/PF. Sensed pain and light touch in both feet.  Skin: She is not diaphoretic. No erythema.  2 stage II areas on sacrum measuring about 3-4 cm in area each. ?minor skin tearing, more peeling of skin.    Results for orders placed or performed during the hospital encounter of 07/07/19 (from the  past 48 hour(s))  Basic metabolic panel     Status: Abnormal   Collection Time: 07/21/19  1:30 PM  Result Value Ref Range   Sodium 138 135 - 145 mmol/L   Potassium 4.1 3.5 - 5.1 mmol/L   Chloride 93 (L) 98 - 111 mmol/L   CO2 37 (H) 22 - 32 mmol/L   Glucose, Bld 124 (H) 70 - 99 mg/dL    Comment: Glucose reference range applies only to samples taken after fasting for at least 8 hours.   BUN 15 8 - 23 mg/dL   Creatinine, Ser 0.66 0.44 - 1.00 mg/dL   Calcium 8.0 (L) 8.9 - 10.3 mg/dL   GFR calc non Af Amer >60 >60 mL/min   GFR calc Af Amer >60 >60 mL/min   Anion gap 8 5 - 15    Comment: Performed at [facility] [address], [place], [place] [address]  Brain natriuretic peptide     Status: Abnormal   Collection Time: 07/21/19  2:00 PM  Result Value Ref Range   B Natriuretic Peptide 721.3 (H) 0.0 - 100.0 pg/mL    Comment: Performed at [facility] [address] [place], [place] [id number]  Basic metabolic panel     Status: Abnormal   Collection Time: 07/22/19  5:30 AM  Result Value Ref Range   Sodium 139 135 - 145 mmol/L   Potassium 4.0 3.5 - 5.1 mmol/L   Chloride 96 (L) 98 - 111 mmol/L   CO2 37 (H) 22 - 32 mmol/L   Glucose, Bld 98 70 - 99 mg/dL    Comment: Glucose reference range applies only to samples taken after fasting for at least 8 hours.   BUN 12 8 - 23 mg/dL   Creatinine, Ser 0.57 0.44 - 1.00 mg/dL   Calcium 8.0 (L) 8.9 - 10.3 mg/dL   GFR calc non Af Amer >60 >60 mL/min   GFR calc Af Amer >60 >60 mL/min   Anion gap 6 5 - 15    Comment: Performed at [facility] [address] [place],  [address]  CBC     Status: Abnormal   Collection Time: 07/22/19  8:47 AM  Result Value Ref Range  WBC 7.2 4.0 - 10.5 K/uL   RBC 3.13 (L) 3.87 - 5.11 MIL/uL   Hemoglobin 9.4 (L) 12.0 - 15.0 g/dL   HCT 30.5 (L) 36.0 - 46.0 %   MCV 97.4 80.0 - 100.0 fL   MCH 30.0 26.0 - 34.0 pg   MCHC 30.8 30.0 - 36.0 g/dL   RDW 21.4 (H) 11.5 - 15.5 %   Platelets 298  150 - 400 K/uL   nRBC 0.0 0.0 - 0.2 %    Comment: Performed at [facility], [place] [address] [place], [place] [address]  CBC     Status: Abnormal   Collection Time: 07/23/19  1:06 AM  Result Value Ref Range   WBC 7.8 4.0 - 10.5 K/uL   RBC 2.94 (L) 3.87 - 5.11 MIL/uL   Hemoglobin 8.9 (L) 12.0 - 15.0 g/dL   HCT 28.4 (L) 36.0 - 46.0 %   MCV 96.6 80.0 - 100.0 fL   MCH 30.3 26.0 - 34.0 pg   MCHC 31.3 30.0 - 36.0 g/dL   RDW 21.5 (H) 11.5 - 15.5 %   Platelets 289 150 - 400 K/uL   nRBC 0.0 0.0 - 0.2 %    Comment: Performed at [facility], [place] [address] [place], [place] [address]   No results found.      Medical Problem List and Plan: 1.  Functional and mobility deficits secondary to debility after empyema, thoracotomy and associated deficts  -patient may not yet shower  -ELOS/Goals: mod I with PT, OT, SLP---10-14 days 2.  Antithrombotics: -DVT/anticoagulation:  Pharmaceutical: Other (comment)--Eliquis  -antiplatelet therapy: N/A 3. OA/Pain Management: has been using Celebrex bid for years. Tylenol prn 4. Mood: LCSW to follow for evaluation and support.   -antipsychotic agents: N/A 5. Neuropsych: This patient is capable of making decisions on her own behalf. 6. Skin/Wound Care: Routine pressure relief measures  -barrier cream to sacral wounds  -JP drains remain. Continue until drainage decreases sufficiently 7. Fluids/Electrolytes/Nutrition: Monitor I/O. Offer supplements between meals.  8. Empyema s/p drainage: Continue to monitor output from drains. Colchicine added on 2/26  "to try to get tubes drier" 9. PAF: In NSR on amiodarone. Eliquis on board since 2/24 10 Fluid overload: Monitor weights daily. Continue lasix.  -heart failure team following 11. ABLA:  hgb 8.6 today and trending down 10.4--> 9.7-->9.4-->8.9 12. Severe protein calorie malnutrition: encourage PO, protein supps 13. COPD- oxygen dependent since for the past year but did not start using it till  Sept. Respiratiory status stable on Dulera and Singulair.   14. Nausea/Vomiting: Has had stomach issues for year. N/V has resolved--continue Protonix--decreased to daily. Celebrex likely contributing to GI distress.      Bary Leriche, PA-C 07/23/2019

## 2019-07-23 NOTE — TOC Progression Note (Addendum)
Transition of Care [facility]) - Progression Note    Patient Details  Name: Connie Jarvis MRN: [id number] Date of Birth: 1941/01/16  Transition of [facility]) CM/SW Contact  Zenon Mayo, RN Phone Number: 07/23/2019, 4:04 PM  Clinical Narrative:    NCM informed patient of the copay amt for entresto, will make sure she has 30 day coupon. PLan to admit to CIR on Saturday per Pamala Hurry note.         Expected Discharge Plan and Services                                                 Social Determinants of Health (SDOH) Interventions    Readmission Risk Interventions No flowsheet data found.

## 2019-07-23 NOTE — Progress Notes (Addendum)
10 Days Post-Op Procedure(s) (LRB): GREATER OMENTAL FLAP CLOSURE (Left) CLOSURE OF THORACOTOMY (Left) APPLICATION OF WOUND VAC USING PREVENA DRESSING (Left) Chest Tube Insertion (Right) Vascular Assessment With Spy Elite (Left) Subjective: Awake and alert. No new concerns. Says she is continuing to progress.   Objective: Vital signs in last 24 hours: Temp:  [97.9 F (36.6 C)-98.4 F (36.9 C)] 97.9 F (36.6 C) (02/26 0812) Pulse Rate:  [76-85] 82 (02/25 1938) Cardiac Rhythm: Normal sinus rhythm (02/26 0730) Resp:  [20-25] 24 (02/26 0418) BP: (104-151)/(44-76) 104/44 (02/26 0418) SpO2:  [96 %-100 %] 96 % (02/26 0837) Weight:  [60.1 kg] 60.1 kg (02/26 0620)     Intake/Output from previous day: 02/25 0701 - 02/26 0700 In: 930 [P.O.:830] Out: 601 [Urine:320; Drains:235; Chest Tube:46] Intake/Output this shift: No intake/output data recorded.  Physical Exam: General appearance:alert, cooperative andnodistress Neurologic:no deficits Heart:NSR  Lungs:Breath sounds are clear,245ml serous fluid from the leftdrainagetubesover the past 24 hours. Abdomen:soft,normalbowel soundstoday,minimalperi-incisional tenderness Extremities:All warm and well perfused.  Wound:The left chest wound is dry, without erythema, and well approximated with staples and sutures. Themidline abdominal incision is dry .The incision is well approximated with staples.  Lab Results: Recent Labs    07/22/19 0847 07/23/19 0106  WBC 7.2 7.8  HGB 9.4* 8.9*  HCT 30.5* 28.4*  PLT 298 289   BMET:  Recent Labs    07/21/19 1330 07/22/19 0530  NA 138 139  K 4.1 4.0  CL 93* 96*  CO2 37* 37*  GLUCOSE 124* 98  BUN 15 12  CREATININE 0.66 0.57  CALCIUM 8.0* 8.0*    PT/INR: No results for input(s): LABPROT, INR in the last 72 hours. ABG    Component Value Date/Time   PHART 7.380 07/14/2019 0436   HCO3 27.9 07/14/2019 0436   TCO2 30 07/09/2019 0427   O2SAT 98.2 07/14/2019 0436    CBG (last 3)  No results for input(s): GLUCAP in the last 72 hours.  Assessment/Plan: S/P Procedure(s) (LRB): GREATER OMENTAL FLAP CLOSURE (Left) CLOSURE OF THORACOTOMY (Left) APPLICATION OF WOUND VAC USING PREVENA DRESSING (Left) Chest Tube Insertion (Right) Vascular Assessment With Spy Elite (Left)  -POD-15left thoracotomy for drainage of empyema, debridement of the left chest wall wound, and placement of a wound vac. The vac was changed in the OR 3 days later. Initial wound Cx + for Strept intermedius(PCN-sensitive)  -POD-10 closure of the open left chest wound with greater omental flap and drainage of a right pleural effusion and chest tube placement.The left drains areconnected to bulb suction and serous drainage is clear but remains moderate. Plan to keep tubes in place for several more days.  -Paroxysmal atrial fibrillation-HoldingSRonoralamiodarone.  Eliquis started.  -Volume overload-mild LE edema. Continue oral Lasix per HF service.  -Expected acute blood loss anemia-Mild, monitor.  -Nausea with vomiting- resolved.  -Disposition-Ready for transition to CIR when bed available. Will plan to discharge with drains in place.    LOS: 16 days    Antony Odea, [place] [phone] 07/23/2019 Pt seen and examined; agree with note. Adding colchicine again to try to get tubes drier.  Oriel Rumbold Z. Orvan Seen, [place]

## 2019-07-23 NOTE — Progress Notes (Addendum)
Advanced Heart Failure Rounding Note   Subjective:    S/p thoracotomy on 2/11.  S/P Greater Omental Flap Closure. Drains in place  Denies SOB. Feeling stronger.    Objective:   Weight Range:  Vital Signs:   Temp:  [97.9 F (36.6 C)-98 F (36.7 C)] 97.9 F (36.6 C) (02/26 0812) Pulse Rate:  [76-85] 82 (02/25 1938) Resp:  [20-25] 24 (02/26 0418) BP: (104-136)/(44-66) 104/44 (02/26 0418) SpO2:  [96 %-100 %] 96 % (02/26 0837) Weight:  [60.1 kg] 60.1 kg (02/26 0620) Last BM Date: 07/21/19  Weight change: Filed Weights   07/21/19 0259 07/22/19 0345 07/23/19 0620  Weight: 61.2 kg 60.6 kg 60.1 kg    Intake/Output:   Intake/Output Summary (Last 24 hours) at 07/23/2019 1115 Last data filed at 07/23/2019 0700 Gross per 24 hour  Intake 630 ml  Output 509 ml  Net 121 ml     Physical Exam: General:  Well appearing. No resp difficulty HEENT: normal Neck: supple. no JVD. Carotids 2+ bilat; no bruits. No lymphadenopathy or thryomegaly appreciated. Cor: PMI nondisplaced. Regular rate & rhythm. No rubs, gallops or murmurs. Lungs: clear Abdomen: soft, nontender, nondistended. No hepatosplenomegaly. No bruits or masses. Good bowel sounds. L JP drain x2.  Extremities: no cyanosis, clubbing, rash, edema. RUE PICC  Neuro: alert & orientedx3, cranial nerves grossly intact. moves all 4 extremities w/o difficulty. Affect pleasant  Telemetry: NSR 70s personally reviewed.   Labs: Basic Metabolic Panel: Recent Labs  Lab 07/18/19 0535 07/18/19 0535 07/18/19 1042 07/19/19 0452 07/19/19 0452 07/20/19 0500 07/21/19 1330 07/22/19 0530  NA 134*  --   --  138  --  137 138 139  K 7.4*   < > 5.1 4.4  --  4.1 4.1 4.0  CL 94*  --   --  94*  --  93* 93* 96*  CO2 34*  --   --  36*  --  37* 37* 37*  GLUCOSE 186*  --   --  79  --  83 124* 98  BUN 10  --   --  13  --  15 15 12   CREATININE 0.92  --   --  0.75  --  0.85 0.66 0.57  CALCIUM 7.6*   < >  --  8.0*   < > 7.9* 8.0* 8.0*  MG   --   --   --  1.9  --  1.8  --   --    < > = values in this interval not displayed.    Liver Function Tests: No results for input(s): AST, ALT, ALKPHOS, BILITOT, PROT, ALBUMIN in the last 168 hours. No results for input(s): LIPASE, AMYLASE in the last 168 hours. No results for input(s): AMMONIA in the last 168 hours.  CBC: Recent Labs  Lab 07/18/19 0535 07/19/19 0452 07/20/19 0500 07/22/19 0847 07/23/19 0106  WBC 7.6 6.9 8.3 7.2 7.8  HGB 10.4* 9.5* 9.7* 9.4* 8.9*  HCT 32.1* 29.9* 30.8* 30.5* 28.4*  MCV 93.0 94.3 96.0 97.4 96.6  PLT 268 252 270 298 289    Cardiac Enzymes: No results for input(s): CKTOTAL, CKMB, CKMBINDEX, TROPONINI in the last 168 hours.  BNP: BNP (last 3 results) Recent Labs    07/21/19 1400  BNP 721.3*    ProBNP (last 3 results) No results for input(s): PROBNP in the last 8760 hours.    Other results:  Imaging: No results found.   Medications:     Scheduled Medications: .  amiodarone  200 mg Oral BID  . apixaban  5 mg Oral BID  . celecoxib  200 mg Per Tube BID  . chlorhexidine gluconate (MEDLINE KIT)  15 mL Mouth Rinse BID  . Chlorhexidine Gluconate Cloth  6 each Topical Daily  . feeding supplement (ENSURE ENLIVE)  237 mL Oral TID BM  . furosemide  20 mg Oral Daily  . Gerhardt's butt cream   Topical BID  . mometasone-formoterol  2 puff Inhalation BID  . montelukast  10 mg Per Tube Daily  . multivitamin with minerals  1 tablet Oral Daily  . pantoprazole  40 mg Oral Daily    Infusions: . sodium chloride 20.8 mL/hr at 07/17/19 0641  . 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 Wound vac in place -S/P 2/16 Greater Omental Flap Closure CT x2 + wound vac - GS with +++GPC. Culture strep intermedius--> treated w/   Penicillin. Completed 12 days of abx, now off due to nausea. Continue to monitor closely per conversation w/ CT surgery.  AF. WCB nl.  - TCTS managing.  2. PAH - very mild by cath PVR < 1.0 - Sildenafil off with low BP.  - Restart once improved.   3. PAF - Maintaining NSR.  - continue amiodarone 200 bid - Continue Eliquis 5 mg bid. Monitor H/H  4. Volume overload -Appears euvolemic.  - Continue po lasix.  20 mg qd.  5. Hypokalemia - No BMET today.    6. Severe protein calorie malnutrition -Albumin 1.7 - Passed speech eval.  - appetite improving   7. Anemia  - Hgb 8.9  - Monitor w/ Eliquis   8. Severe Deconditioning - PT following. Plan CIR when bed available.   Length of Stay: [place] NP 07/23/2019, 11:15 AM  Advanced Heart Failure Team Pager [phone] (M-F; [place])  Please contact [facility] for night-coverage after hours (4p -7a ) and weekends on [email or web address]   Maintaining NSR on po amio. No bleeding on Eliquis. Volume status looks good on low dose lasix. Would drop amio to 200 daily on 3/8.  TCTS following wound. Awaiting CIR bed.  We will sign off. Please call with questions,   Glori Bickers, MD  12:47 PM

## 2019-07-24 ENCOUNTER — Encounter (HOSPITAL_COMMUNITY): Payer: Self-pay | Admitting: Physical Medicine and Rehabilitation

## 2019-07-24 ENCOUNTER — Inpatient Hospital Stay (HOSPITAL_COMMUNITY)
Admission: RE | Admit: 2019-07-24 | Discharge: 2019-08-03 | DRG: 945 | Disposition: A | Discharge status: Home Health | Payer: Medicare Other | Source: Intra-hospital | Attending: Physical Medicine and Rehabilitation | Admitting: Physical Medicine and Rehabilitation

## 2019-07-24 ENCOUNTER — Other Ambulatory Visit: Payer: Self-pay

## 2019-07-24 DIAGNOSIS — Z7951 Long term (current) use of inhaled steroids: Secondary | ICD-10-CM | POA: Diagnosis not present

## 2019-07-24 DIAGNOSIS — Z91011 Allergy to milk products: Secondary | ICD-10-CM

## 2019-07-24 DIAGNOSIS — Z888 Allergy status to other drugs, medicaments and biological substances status: Secondary | ICD-10-CM | POA: Diagnosis not present

## 2019-07-24 DIAGNOSIS — R5381 Other malaise: Principal | ICD-10-CM | POA: Diagnosis present

## 2019-07-24 DIAGNOSIS — Z6826 Body mass index (BMI) 26.0-26.9, adult: Secondary | ICD-10-CM

## 2019-07-24 DIAGNOSIS — I10 Essential (primary) hypertension: Secondary | ICD-10-CM | POA: Diagnosis present

## 2019-07-24 DIAGNOSIS — J869 Pyothorax without fistula: Secondary | ICD-10-CM | POA: Diagnosis not present

## 2019-07-24 DIAGNOSIS — J9621 Acute and chronic respiratory failure with hypoxia: Secondary | ICD-10-CM | POA: Diagnosis present

## 2019-07-24 DIAGNOSIS — E8770 Fluid overload, unspecified: Secondary | ICD-10-CM | POA: Diagnosis present

## 2019-07-24 DIAGNOSIS — J449 Chronic obstructive pulmonary disease, unspecified: Secondary | ICD-10-CM | POA: Diagnosis present

## 2019-07-24 DIAGNOSIS — Z7982 Long term (current) use of aspirin: Secondary | ICD-10-CM | POA: Diagnosis not present

## 2019-07-24 DIAGNOSIS — D62 Acute posthemorrhagic anemia: Secondary | ICD-10-CM | POA: Diagnosis present

## 2019-07-24 DIAGNOSIS — I482 Chronic atrial fibrillation, unspecified: Secondary | ICD-10-CM | POA: Diagnosis present

## 2019-07-24 DIAGNOSIS — Z91018 Allergy to other foods: Secondary | ICD-10-CM

## 2019-07-24 DIAGNOSIS — F329 Major depressive disorder, single episode, unspecified: Secondary | ICD-10-CM | POA: Diagnosis present

## 2019-07-24 DIAGNOSIS — I480 Paroxysmal atrial fibrillation: Secondary | ICD-10-CM | POA: Diagnosis not present

## 2019-07-24 DIAGNOSIS — F419 Anxiety disorder, unspecified: Secondary | ICD-10-CM | POA: Diagnosis present

## 2019-07-24 DIAGNOSIS — Z79899 Other long term (current) drug therapy: Secondary | ICD-10-CM

## 2019-07-24 DIAGNOSIS — K219 Gastro-esophageal reflux disease without esophagitis: Secondary | ICD-10-CM | POA: Diagnosis present

## 2019-07-24 DIAGNOSIS — E440 Moderate protein-calorie malnutrition: Secondary | ICD-10-CM | POA: Diagnosis present

## 2019-07-24 DIAGNOSIS — Z452 Encounter for adjustment and management of vascular access device: Secondary | ICD-10-CM | POA: Diagnosis not present

## 2019-07-24 DIAGNOSIS — Z885 Allergy status to narcotic agent status: Secondary | ICD-10-CM | POA: Diagnosis not present

## 2019-07-24 DIAGNOSIS — R490 Dysphonia: Secondary | ICD-10-CM | POA: Diagnosis present

## 2019-07-24 DIAGNOSIS — I272 Pulmonary hypertension, unspecified: Secondary | ICD-10-CM | POA: Diagnosis present

## 2019-07-24 DIAGNOSIS — J410 Simple chronic bronchitis: Secondary | ICD-10-CM

## 2019-07-24 DIAGNOSIS — Z9981 Dependence on supplemental oxygen: Secondary | ICD-10-CM | POA: Diagnosis not present

## 2019-07-24 DIAGNOSIS — Z09 Encounter for follow-up examination after completed treatment for conditions other than malignant neoplasm: Secondary | ICD-10-CM

## 2019-07-24 DIAGNOSIS — J90 Pleural effusion, not elsewhere classified: Secondary | ICD-10-CM | POA: Diagnosis not present

## 2019-07-24 DIAGNOSIS — Z883 Allergy status to other anti-infective agents status: Secondary | ICD-10-CM

## 2019-07-24 LAB — CBC
HCT: 27.4 % — ABNORMAL LOW (ref 36.0–46.0)
Hemoglobin: 8.6 g/dL — ABNORMAL LOW (ref 12.0–15.0)
MCH: 30.4 pg (ref 26.0–34.0)
MCHC: 31.4 g/dL (ref 30.0–36.0)
MCV: 96.8 fL (ref 80.0–100.0)
Platelets: 310 10*3/uL (ref 150–400)
RBC: 2.83 MIL/uL — ABNORMAL LOW (ref 3.87–5.11)
RDW: 21.4 % — ABNORMAL HIGH (ref 11.5–15.5)
WBC: 8.2 10*3/uL (ref 4.0–10.5)
nRBC: 0 % (ref 0.0–0.2)

## 2019-07-24 MED ORDER — GERHARDT'S BUTT CREAM: 1.0000 "application " | TOPICAL_CREAM | Freq: Two times a day (BID) | CUTANEOUS | Status: AC

## 2019-07-24 MED ORDER — ENSURE ENLIVE PO LIQD
237.0000 mL | Freq: Three times a day (TID) | ORAL | Status: DC
  Administered 2019-07-24 – 2019-08-03 (×24): 237 mL via ORAL
  Filled 2019-07-24 (×2): qty 237

## 2019-07-24 MED ORDER — ACETAMINOPHEN 325 MG PO TABS
325.0000 mg | ORAL_TABLET | ORAL | Status: DC | PRN
  Administered 2019-07-29: 650 mg via ORAL
  Filled 2019-07-24 (×3): qty 2

## 2019-07-24 MED ORDER — COLCHICINE 0.3 MG HALF TABLET
0.3000 mg | ORAL_TABLET | Freq: Every day | ORAL | Status: DC
  Administered 2019-07-25 – 2019-08-03 (×10): 0.3 mg via ORAL
  Filled 2019-07-24 (×10): qty 1

## 2019-07-24 MED ORDER — CHLORHEXIDINE GLUCONATE CLOTH 2 % EX PADS
6.0000 | MEDICATED_PAD | Freq: Every day | CUTANEOUS | Status: DC
  Administered 2019-07-25 – 2019-07-29 (×5): 6 via TOPICAL

## 2019-07-24 MED ORDER — DIPHENHYDRAMINE HCL 12.5 MG/5ML PO ELIX: 12.5000 mg | ORAL_SOLUTION | Freq: Four times a day (QID) | ORAL | Status: DC | PRN

## 2019-07-24 MED ORDER — BISACODYL 5 MG PO TBEC: 10.0000 mg | DELAYED_RELEASE_TABLET | Freq: Every day | ORAL | 0 refills | Status: DC | PRN

## 2019-07-24 MED ORDER — AMIODARONE HCL 200 MG PO TABS: 200.0000 mg | ORAL_TABLET | Freq: Two times a day (BID) | ORAL | Status: DC

## 2019-07-24 MED ORDER — LOPERAMIDE HCL 1 MG/7.5ML PO SUSP: 1.0000 mg | ORAL | 0 refills | Status: DC | PRN

## 2019-07-24 MED ORDER — ACETAMINOPHEN 500 MG PO TABS: 500.0000 mg | ORAL_TABLET | Freq: Four times a day (QID) | ORAL | Status: DC | PRN

## 2019-07-24 MED ORDER — GUAIFENESIN-DM 100-10 MG/5ML PO SYRP: 5.0000 mL | ORAL_SOLUTION | Freq: Four times a day (QID) | ORAL | Status: DC | PRN

## 2019-07-24 MED ORDER — ADULT MULTIVITAMIN W/MINERALS CH
1.0000 | ORAL_TABLET | Freq: Every day | ORAL | Status: DC
  Administered 2019-07-25 – 2019-08-03 (×10): 1 via ORAL
  Filled 2019-07-24 (×10): qty 1

## 2019-07-24 MED ORDER — BISACODYL 5 MG PO TBEC: 10.0000 mg | DELAYED_RELEASE_TABLET | Freq: Every day | ORAL | Status: DC | PRN

## 2019-07-24 MED ORDER — PANTOPRAZOLE SODIUM 40 MG PO TBEC
40.0000 mg | DELAYED_RELEASE_TABLET | Freq: Every day | ORAL | Status: DC
  Administered 2019-07-25 – 2019-08-03 (×10): 40 mg via ORAL
  Filled 2019-07-24 (×10): qty 1

## 2019-07-24 MED ORDER — ENSURE ENLIVE PO LIQD: 237.0000 mL | Freq: Three times a day (TID) | ORAL | 12 refills | Status: DC

## 2019-07-24 MED ORDER — PANTOPRAZOLE SODIUM 40 MG PO TBEC: 40.0000 mg | DELAYED_RELEASE_TABLET | Freq: Every day | ORAL | Status: DC

## 2019-07-24 MED ORDER — SODIUM CHLORIDE 0.9% FLUSH
10.0000 mL | Freq: Two times a day (BID) | INTRAVENOUS | Status: DC
  Administered 2019-07-24 – 2019-07-29 (×8): 10 mL

## 2019-07-24 MED ORDER — MOMETASONE FURO-FORMOTEROL FUM 100-5 MCG/ACT IN AERO
2.0000 | INHALATION_SPRAY | Freq: Two times a day (BID) | RESPIRATORY_TRACT | Status: DC
  Administered 2019-07-24 – 2019-08-03 (×16): 2 via RESPIRATORY_TRACT
  Filled 2019-07-24: qty 8.8

## 2019-07-24 MED ORDER — MONTELUKAST SODIUM 10 MG PO TABS
10.0000 mg | ORAL_TABLET | Freq: Every day | ORAL | Status: DC
  Administered 2019-07-25 – 2019-08-01 (×8): 10 mg
  Filled 2019-07-24 (×8): qty 1

## 2019-07-24 MED ORDER — PROCHLORPERAZINE 25 MG RE SUPP: 12.5000 mg | Freq: Four times a day (QID) | RECTAL | Status: DC | PRN

## 2019-07-24 MED ORDER — ALUM & MAG HYDROXIDE-SIMETH 200-200-20 MG/5ML PO SUSP: 30.0000 mL | ORAL | Status: DC | PRN

## 2019-07-24 MED ORDER — METOPROLOL TARTRATE 5 MG/5ML IV SOLN
2.5000 mg | Freq: Three times a day (TID) | INTRAVENOUS | Status: DC | PRN
  Filled 2019-07-24: qty 5

## 2019-07-24 MED ORDER — MOMETASONE FURO-FORMOTEROL FUM 100-5 MCG/ACT IN AERO: 2.0000 | INHALATION_SPRAY | Freq: Two times a day (BID) | RESPIRATORY_TRACT | Status: DC

## 2019-07-24 MED ORDER — APIXABAN 5 MG PO TABS
5.0000 mg | ORAL_TABLET | Freq: Two times a day (BID) | ORAL | Status: DC
  Administered 2019-07-24 – 2019-08-03 (×20): 5 mg via ORAL
  Filled 2019-07-24 (×20): qty 1

## 2019-07-24 MED ORDER — ONDANSETRON HCL 4 MG/2ML IJ SOLN: 4.0000 mg | Freq: Four times a day (QID) | INTRAMUSCULAR | 0 refills | Status: DC | PRN

## 2019-07-24 MED ORDER — MONTELUKAST SODIUM 10 MG PO TABS: 10.0000 mg | ORAL_TABLET | Freq: Every day | ORAL | Status: DC

## 2019-07-24 MED ORDER — CELECOXIB 200 MG PO CAPS
200.0000 mg | ORAL_CAPSULE | Freq: Every day | ORAL | Status: DC
  Administered 2019-07-25 – 2019-08-02 (×9): 200 mg
  Filled 2019-07-24 (×9): qty 1

## 2019-07-24 MED ORDER — TRAZODONE HCL 50 MG PO TABS
25.0000 mg | ORAL_TABLET | Freq: Every day | ORAL | Status: DC
  Administered 2019-07-24: 22:00:00 25 mg via ORAL
  Filled 2019-07-24 (×5): qty 1

## 2019-07-24 MED ORDER — PROCHLORPERAZINE EDISYLATE 10 MG/2ML IJ SOLN: 5.0000 mg | Freq: Four times a day (QID) | INTRAMUSCULAR | Status: DC | PRN

## 2019-07-24 MED ORDER — FUROSEMIDE 20 MG PO TABS
20.0000 mg | ORAL_TABLET | Freq: Every day | ORAL | Status: DC
  Administered 2019-07-25 – 2019-08-03 (×10): 20 mg via ORAL
  Filled 2019-07-24 (×10): qty 1

## 2019-07-24 MED ORDER — GERHARDT'S BUTT CREAM
TOPICAL_CREAM | Freq: Two times a day (BID) | CUTANEOUS | Status: DC
  Filled 2019-07-24: qty 1

## 2019-07-24 MED ORDER — AMIODARONE HCL 200 MG PO TABS
200.0000 mg | ORAL_TABLET | Freq: Two times a day (BID) | ORAL | Status: DC
  Administered 2019-07-24 – 2019-08-03 (×20): 200 mg via ORAL
  Filled 2019-07-24 (×20): qty 1

## 2019-07-24 MED ORDER — LEVALBUTEROL HCL 0.63 MG/3ML IN NEBU: 0.6300 mg | INHALATION_SOLUTION | Freq: Four times a day (QID) | RESPIRATORY_TRACT | Status: DC | PRN

## 2019-07-24 MED ORDER — SODIUM CHLORIDE 0.9% FLUSH: 10.0000 mL | INTRAVENOUS | Status: DC | PRN

## 2019-07-24 MED ORDER — LEVALBUTEROL HCL 0.63 MG/3ML IN NEBU: 0.6300 mg | INHALATION_SOLUTION | Freq: Four times a day (QID) | RESPIRATORY_TRACT | 12 refills | Status: DC | PRN

## 2019-07-24 MED ORDER — APIXABAN 5 MG PO TABS: 5.0000 mg | ORAL_TABLET | Freq: Two times a day (BID) | ORAL | Status: DC

## 2019-07-24 MED ORDER — CELECOXIB 200 MG PO CAPS: 200.0000 mg | ORAL_CAPSULE | Freq: Two times a day (BID) | ORAL | Status: DC

## 2019-07-24 MED ORDER — COLCHICINE 0.6 MG PO TABS: 0.3000 mg | ORAL_TABLET | Freq: Every day | ORAL | Status: DC

## 2019-07-24 MED ORDER — FLEET ENEMA 7-19 GM/118ML RE ENEM: 1.0000 | ENEMA | Freq: Once | RECTAL | Status: DC | PRN

## 2019-07-24 MED ORDER — PROCHLORPERAZINE MALEATE 5 MG PO TABS: 5.0000 mg | ORAL_TABLET | Freq: Four times a day (QID) | ORAL | Status: DC | PRN

## 2019-07-24 MED ORDER — CHLORHEXIDINE GLUCONATE 0.12% ORAL RINSE (MEDLINE KIT)
15.0000 mL | Freq: Two times a day (BID) | OROMUCOSAL | Status: DC
  Administered 2019-07-24 – 2019-08-03 (×15): 15 mL via OROMUCOSAL

## 2019-07-24 MED ORDER — TRAZODONE HCL 50 MG PO TABS: 25.0000 mg | ORAL_TABLET | Freq: Every evening | ORAL | Status: DC | PRN

## 2019-07-24 MED ORDER — FUROSEMIDE 20 MG PO TABS: 20.0000 mg | ORAL_TABLET | Freq: Every day | ORAL | Status: DC

## 2019-07-24 MED ORDER — BISACODYL 10 MG RE SUPP: 10.0000 mg | Freq: Every day | RECTAL | Status: DC | PRN

## 2019-07-24 MED ORDER — POLYETHYLENE GLYCOL 3350 17 G PO PACK: 17.0000 g | PACK | Freq: Every day | ORAL | Status: DC | PRN

## 2019-07-24 MED ORDER — ADULT MULTIVITAMIN W/MINERALS CH: 1.0000 | ORAL_TABLET | Freq: Every day | ORAL | Status: AC

## 2019-07-24 MED ORDER — PROMETHAZINE HCL 25 MG/ML IJ SOLN: 6.2500 mg | Freq: Three times a day (TID) | INTRAMUSCULAR | 0 refills | Status: DC | PRN

## 2019-07-24 MED ORDER — BARRIER CREAM NON-SPECIFIED: 1.0000 "application " | TOPICAL_CREAM | Freq: Two times a day (BID) | TOPICAL | Status: DC

## 2019-07-24 NOTE — Progress Notes (Signed)
Patient arrived on unit, oriented to unit. Reviewed medicines, therapy schedule, rehab routine and plan of care. States an understanding of information reviewed. Connie Jarvis  

## 2019-07-24 NOTE — Plan of Care (Signed)
  Problem: Education: Goal: Knowledge of General Education information will improve Description: Including pain rating scale, medication(s)/side effects and non-pharmacologic comfort measures Outcome: Completed/Met   Problem: Health Behavior/Discharge Planning: Goal: Ability to manage health-related needs will improve Outcome: Completed/Met   Problem: Clinical Measurements: Goal: Ability to maintain clinical measurements within normal limits will improve Outcome: Completed/Met Goal: Will remain free from infection Outcome: Completed/Met Goal: Diagnostic test results will improve Outcome: Completed/Met Goal: Respiratory complications will improve Outcome: Completed/Met Goal: Cardiovascular complication will be avoided Outcome: Completed/Met   Problem: Activity: Goal: Risk for activity intolerance will decrease Outcome: Completed/Met   Problem: Nutrition: Goal: Adequate nutrition will be maintained Outcome: Completed/Met   Problem: Coping: Goal: Level of anxiety will decrease Outcome: Completed/Met   Problem: Elimination: Goal: Will not experience complications related to bowel motility Outcome: Completed/Met Goal: Will not experience complications related to urinary retention Outcome: Completed/Met   Problem: Pain Managment: Goal: General experience of comfort will improve Outcome: Completed/Met   Problem: Safety: Goal: Ability to remain free from injury will improve Outcome: Completed/Met   Problem: Skin Integrity: Goal: Risk for impaired skin integrity will decrease Outcome: Completed/Met   Problem: Acute Rehab PT Goals(only PT should resolve) Goal: Pt Will Go Supine/Side To Sit Outcome: Completed/Met Goal: Patient Will Transfer Sit To/From Stand Outcome: Completed/Met Goal: Pt Will Transfer Bed To Chair/Chair To Bed Outcome: Completed/Met Goal: Pt Will Ambulate Outcome: Completed/Met Goal: Pt Will Go Up/Down Stairs Outcome: Completed/Met

## 2019-07-24 NOTE — Progress Notes (Addendum)
[address]       [place] [address]             [phone]      11 Days Post-Op Procedure(s) (LRB): GREATER OMENTAL FLAP CLOSURE (Left) CLOSURE OF THORACOTOMY (Left) APPLICATION OF WOUND VAC USING PREVENA DRESSING (Left) Chest Tube Insertion (Right) Vascular Assessment With Spy Elite (Left) Subjective: Feels pretty well overall  Objective: Vital signs in last 24 hours: Temp:  [97.6 F (36.4 C)-98.5 F (36.9 C)] 97.6 F (36.4 C) (02/27 0751) Pulse Rate:  [66-82] 74 (02/27 0751) Cardiac Rhythm: Normal sinus rhythm (02/27 0700) Resp:  [14-21] 18 (02/27 0751) BP: (93-137)/(58-91) 111/59 (02/27 0751) SpO2:  [96 %-100 %] 100 % (02/27 0751) FiO2 (%):  [28 %] 28 % (02/27 0750) Weight:  [61 kg] 61 kg (02/27 0303)  Hemodynamic parameters for last 24 hours:    Intake/Output from previous day: 02/26 0701 - 02/27 0700 In: 969 [P.O.:894] Out: 291 [Urine:50; Drains:240; Stool:1] Intake/Output this shift: No intake/output data recorded.  General appearance: alert, cooperative and no distress Heart: regular rate and rhythm and + systolic murmur Lungs: clear Abdomen: doft Extremities: + edema, no calf tenderness Wound: minor erethema at staples *soft Lab Results: Recent Labs    07/23/19 0106 07/24/19 0224  WBC 7.8 8.2  HGB 8.9* 8.6*  HCT 28.4* 27.4*  PLT 289 310   BMET:  Recent Labs    07/21/19 1330 07/22/19 0530  NA 138 139  K 4.1 4.0  CL 93* 96*  CO2 37* 37*  GLUCOSE 124* 98  BUN 15 12  CREATININE 0.66 0.57  CALCIUM 8.0* 8.0*    PT/INR: No results for input(s): LABPROT, INR in the last 72 hours. ABG    Component Value Date/Time   PHART 7.380 07/14/2019 0436   HCO3 27.9 07/14/2019 0436   TCO2 30 07/09/2019 0427   O2SAT 98.2 07/14/2019 0436   CBG (last 3)  No results for input(s): GLUCAP in the last 72 hours.  Meds Scheduled Meds: . amiodarone  200 mg Oral BID  . apixaban  5 mg Oral BID  . celecoxib  200 mg Per Tube BID    . chlorhexidine gluconate (MEDLINE KIT)  15 mL Mouth Rinse BID  . Chlorhexidine Gluconate Cloth  6 each Topical Daily  . colchicine  0.3 mg Oral Daily  . feeding supplement (ENSURE ENLIVE)  237 mL Oral TID BM  . furosemide  20 mg Oral Daily  . Gerhardt's butt cream   Topical BID  . mometasone-formoterol  2 puff Inhalation BID  . montelukast  10 mg Per Tube Daily  . multivitamin with minerals  1 tablet Oral Daily  . pantoprazole  40 mg Oral Daily   Continuous Infusions: . sodium chloride 20.8 mL/hr at 07/17/19 0641  . sodium chloride     PRN Meds:.acetaminophen, bisacodyl, levalbuterol, loperamide HCl, metoprolol tartrate, ondansetron (ZOFRAN) IV, promethazine, sodium chloride flush, traZODone  Xrays No results found.  Assessment/Plan: S/P Procedure(s) (LRB): GREATER OMENTAL FLAP CLOSURE (Left) CLOSURE OF THORACOTOMY (Left) APPLICATION OF WOUND VAC USING PREVENA DRESSING (Left) Chest Tube Insertion (Right) Vascular Assessment With Spy Elite (Left)  1 afeb ,VSS, BP a little low at times 2 sats good on 2 liters 3 H/H trending down HCT 30.5>28.4>27.4- monitor clinically, she is on eliquis 4 maintaining sinus rhythm 5 cont meds per AHF team 6 keep drains in place and cont to monitor wound 7 tx to CIR  LOS: 17 days  John Giovanni [facility] 07/24/2019 Pager [phone]   Chart reviewed, patient examined, agree with above. She feels well. Drains put out 240 cc serous fluid for 24 hrs. Keep in for now.

## 2019-07-24 NOTE — H&P (Signed)
Physical Medicine and Rehabilitation Admission H&P     CC: Debility     HPI: Connie Jarvis is a 79 year old female with history of COPD-oxygen dependent, multiple episodes of bronchitis, depression, HTN, left upper chest lesion s/p I &D who was admitted on 07/07/19 with large empyema with compressive atelectasis. She underwent left and right cath revealing mild PAH prior to being taken to OR the same day for major thoracotomy with drainage of empyema and application of wound VAC by Dr. Julien Girt.  Post op had issues with volume overload as well as A fib with RVR and started on IV heparin. IV amiodarone added for rate control and required norepinephrine for BP support. She was started on Vancomycin and Zosyn added on 2/16 as wound culture positive for streptococcus intermedius.  She was taken back to OR on 2/14 for wound irrigation and debridement and ultimately underwent greater omental flap closure on 2/16 with placement of Prevena and right chest tube. She has had issues with N/V and KUB negative for ileus but calcified 10 mm gallstone noted in RUQ. Dr. Haroldine Laws following for management of cardiac issues--she has been weaned off IV amiodarone and transitioned to po diuretics. Left chest wall wound with drains in place with moderate serous drainage. She has completed course of antibiotics 2/20 and Eliquis resumed 2/24. Nausea has resolved and intake improving. Patient noted to be debilitated and CIR recommended due to functional deficits.      Review of Systems  Constitutional: Negative for chills and fever.  HENT: Negative for hearing loss and tinnitus.   Eyes: Negative for blurred vision and double vision.  Respiratory: Positive for shortness of breath (with simple activity at home). Negative for cough.   Cardiovascular: Positive for leg swelling. Negative for chest pain and palpitations.  Gastrointestinal: Positive for diarrhea (chronic on and off). Negative for abdominal pain, heartburn and  nausea.  Genitourinary: Negative for frequency, hematuria and urgency.  Musculoskeletal: Positive for back pain, joint pain, myalgias and neck pain.       Pain and stiffness  Skin: Positive for itching (near incision ). Negative for rash.  Neurological: Positive for weakness. Negative for dizziness and headaches.  Psychiatric/Behavioral: The patient is not nervous/anxious and does not have insomnia.           Past Medical History:  Diagnosis Date  . Allergy    . Anemia    . Anxiety    . Arthritis    . Cataract      pt. not sure which eye early stage  . Depression    . GERD (gastroesophageal reflux disease)    . Hypertension             Past Surgical History:  Procedure Laterality Date  . APPLICATION OF WOUND VAC Left 07/08/2019    Procedure: APPLICATION OF WOUND VAC;  Surgeon: Wonda Olds, MD;  Location: [facility] OR;  Service: Thoracic;  Laterality: Left;  . APPLICATION OF WOUND VAC Left 07/11/2019    Procedure: WOUND VAC CHANGE AND WOUND IRRIGATION AND SHARP DEBRIDEMENT;  Surgeon: Wonda Olds, MD;  Location: [facility];  Service: Thoracic;  Laterality: Left;  . APPLICATION OF WOUND VAC Left 07/13/2019    Procedure: APPLICATION OF WOUND VAC USING PREVENA DRESSING;  Surgeon: Wonda Olds, MD;  Location: [facility];  Service: Thoracic;  Laterality: Left;  . CENTRAL LINE INSERTION Left 07/08/2019    Procedure: CENTRAL LINE INSERTION;  Surgeon: Jolaine Artist,  MD;  Location: [facility];  Service: Cardiovascular;  Laterality: Left;  . CHEST TUBE INSERTION Right 07/13/2019    Procedure: Chest Tube Insertion;  Surgeon: Wonda Olds, MD;  Location: [facility] OR;  Service: Thoracic;  Laterality: Right;  . COLONOSCOPY      . EMPYEMA DRAINAGE Left 07/08/2019    Procedure: EMPYEMA DRAINAGE;  Surgeon: Wonda Olds, MD;  Location: [facility];  Service: Thoracic;  Laterality: Left;  . GREATER OMENTAL FLAP CLOSURE Left 07/13/2019    Procedure: GREATER OMENTAL FLAP CLOSURE;  Surgeon:  Wonda Olds, MD;  Location: [facility];  Service: Thoracic;  Laterality: Left;  . MOUTH SURGERY      . RIGHT/LEFT HEART CATH AND CORONARY ANGIOGRAPHY N/A 07/08/2019    Procedure: RIGHT/LEFT HEART CATH AND CORONARY ANGIOGRAPHY;  Surgeon: Jolaine Artist, MD;  Location: [facility];  Service: Cardiovascular;  Laterality: N/A;  . THORACOTOMY Left 07/08/2019    Procedure: THORACOTOMY MAJOR;  Surgeon: Wonda Olds, MD;  Location: [facility];  Service: Thoracic;  Laterality: Left;  . THORACOTOMY Left 07/13/2019    Procedure: CLOSURE OF THORACOTOMY;  Surgeon: Wonda Olds, MD;  Location: [facility];  Service: Thoracic;  Laterality: Left;  . TONSILLECTOMY      . TUBAL LIGATION      . VASCULAR ASSESSMENT WITH SPY ELITE Left 07/13/2019    Procedure: Vascular Assessment With Spy Elite;  Surgeon: Wonda Olds, MD;  Location: [facility] OR;  Service: Thoracic;  Laterality: Left;           Family History  Problem Relation Age of Onset  . Colon cancer Neg Hx    . Colon polyps Neg Hx    . Esophageal cancer Neg Hx    . Pancreatic cancer Neg Hx    . Rectal cancer Neg Hx    . Stomach cancer Neg Hx        Social History:  Married. 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  Limited functional status --used rollator. She had started using scooter for the past year when out of home. She reports that she has never smoked. She has never used smokeless tobacco. She reports previous alcohol use. She reports that she does not use drugs.          Allergies  Allergen Reactions  . Codeine Nausea And Vomiting  . Gluten Meal Diarrhea      Abdominal pain and bloating  . Lactose Diarrhea      Bloating and abdominal pain  . Neosporin [Neomycin-Bacitracin Zn-Polymyx]    . Other        Pain medication pt. Cannot remember the name.               Facility-Administered Medications Prior to Admission  Medication Dose Route Frequency Provider Last Rate Last Admin  . 0.9 %  sodium chloride infusion   500 mL Intravenous Once Ladene Artist, MD        Medications Prior to Admission  Medication Sig Dispense Refill  . acetaminophen (TYLENOL) 500 MG tablet Take 500 mg by mouth every 6 (six) hours as needed for mild pain, fever or headache.       . albuterol (VENTOLIN HFA) 108 (90 Base) MCG/ACT inhaler Inhale 2 puffs into the lungs every 4 (four) hours as needed for wheezing or shortness of breath.      Marland Kitchen aspirin EC 81 MG tablet Take 81 mg by mouth daily.      Marland Kitchen  benzonatate (TESSALON) 200 MG capsule Take 200 mg by mouth 3 (three) times daily as needed for cough.      . budesonide-formoterol (SYMBICORT) 80-4.5 MCG/ACT inhaler Inhale 2 puffs into the lungs 2 (two) times daily.      . Calcium Carbonate-Vitamin D (CALTRATE 600+D) 600-400 MG-UNIT tablet Take 1 tablet by mouth daily.      . celecoxib (CELEBREX) 200 MG capsule Take 200 mg by mouth 2 (two) times daily.   3  . Cholecalciferol (VITAMIN D3) 10000 units TABS Take 1 tablet by mouth daily.       . Glucosamine-Chondroit-Vit C-Mn (GLUCOSAMINE-CHONDROITIN MAX ST) CAPS Take 1 capsule by mouth daily.       Marland Kitchen losartan (COZAAR) 25 MG tablet Take 25 mg by mouth daily.   3  . metoprolol succinate (TOPROL-XL) 25 MG 24 hr tablet Take 25 mg by mouth daily.       . montelukast (SINGULAIR) 10 MG tablet Take 10 mg by mouth daily.      . Multiple Vitamin (MULTIVITAMIN) tablet Take 1 tablet by mouth daily.      . Omega-3 Fatty Acids (FISH OIL) 1000 MG CAPS Take 1 capsule by mouth daily.       Marland Kitchen omeprazole (PRILOSEC) 20 MG capsule Take 20 mg by mouth daily.      . sildenafil (REVATIO) 20 MG tablet Take 20 mg by mouth 3 (three) times daily.      . valACYclovir (VALTREX) 1000 MG tablet Take 1,000 mg by mouth as needed.      Marland Kitchen b complex vitamins capsule Take 1 capsule by mouth daily.      . clindamycin (CLEOCIN) 300 MG capsule Take 300 mg by mouth 2 (two) times daily.      . macitentan (OPSUMIT) 10 MG tablet Take 10 mg by mouth daily.          Drug Regimen  Review  Drug regimen was reviewed and remains appropriate with no significant issues identified   Home: Home Living Family/patient expects to be discharged to:: Private residence Living Arrangements: Spouse/significant other Available Help at Discharge: Family, Available 24 hours/day(limited physical assistance) Type of Home: House Home Access: Ramped entrance, Stairs to enter(ramp is very long) Technical brewer of Steps: 6 Home Layout: One level Bathroom Shower/Tub: Chiropodist: Handicapped height Home Equipment: Environmental consultant - 4 wheels, Grab bars - tub/shower, Grab bars - toilet   Functional History: Prior Function Level of Independence: Independent with assistive device(s) Comments: short household distances with rollator, limited by supplemental oxygen tank   Functional Status:  Mobility: Bed Mobility Overal bed mobility: Needs Assistance Bed Mobility: Rolling, Sidelying to Sit Rolling: Supervision Sidelying to sit: Min guard Supine to sit: Min assist Sit to supine: Mod assist General bed mobility comments: pt OOB in chair upon arrival Transfers Overall transfer level: Needs assistance Equipment used: Rolling walker (2 wheeled) Transfers: Sit to/from Stand Sit to Stand: Min assist, Min guard Stand pivot transfers: Mod assist General transfer comment: assist to steady upon standing; no assist to power up into standing Ambulation/Gait Ambulation/Gait assistance: Min guard, Min assist Gait Distance (Feet): 140 Feet Assistive device: Rolling walker (2 wheeled) Gait Pattern/deviations: Step-through pattern, Decreased stride length, Narrow base of support, Trunk flexed General Gait Details: cues for upright posture/forward gaze, safe proximity to RW, and PLB; standing breaks due to DOE Gait velocity: reduced Gait velocity interpretation: <1.8 ft/sec, indicate of risk for recurrent falls   ADL:   Cognition: Cognition Overall Cognitive Status:  Within  Functional Limits for tasks assessed Orientation Level: Oriented X4 Cognition Arousal/Alertness: Awake/alert Behavior During Therapy: WFL for tasks assessed/performed Overall Cognitive Status: Within Functional Limits for tasks assessed     Blood pressure (!) 104/44, pulse 82, temperature 97.9 F (36.6 C), temperature source Oral, resp. rate (!) 24, height 5' (1.524 m), weight 60.1 kg, SpO2 96 %. Physical Exam  Nursing note and vitals reviewed. Constitutional: She is oriented to person, place, and time. She appears well-developed. No distress.  Frail appearing  HENT:  Head: Normocephalic and atraumatic.  Eyes: Pupils are equal, round, and reactive to light. EOM are normal.  Neck:  Central line area dressed  Cardiovascular: Normal rate and regular rhythm. Exam reveals no friction rub.  No murmur heard. Respiratory: Effort normal. No respiratory distress. She has decreased breath sounds in the left middle field and the left lower field. She has no wheezes.  Left lateral chest wall incision intact with staples and 2 JP drains in place with serous drainage produced from both. JP sites clean. Decreased sounds at bases.  GI: Soft. She exhibits no distension. There is no abdominal tenderness.  Midline incision with dry dressing with old,bloody drainage, staples well approximated  Musculoskeletal:        General: Edema (2+ pitting edema bilateral shins) present.     Cervical back: Normal range of motion.     Comments: Bilateral lower extremity edema 1 to 2+. Pes planus deformities bilaterally R>L  Neurological: She is alert and oriented to person, place, and time.  Dysphonia noted--mild. More prominent when she fatigues. Otherwise CN intact. UE motor 4/5 proximal to distal. LE: 3/5 HF, KE and 2-3/5 ADF/PF. Sensed pain and light touch in both feet.  Skin: She is not diaphoretic. No erythema.  2 stage II areas on sacrum measuring about 3-4 cm in area each. ?minor skin tearing, more peeling of  skin.      Lab Results Last 48 Hours        Results for orders placed or performed during the hospital encounter of 07/07/19 (from the past 48 hour(s))  Basic metabolic panel     Status: Abnormal    Collection Time: 07/21/19  1:30 PM  Result Value Ref Range    Sodium 138 135 - 145 mmol/L    Potassium 4.1 3.5 - 5.1 mmol/L    Chloride 93 (L) 98 - 111 mmol/L    CO2 37 (H) 22 - 32 mmol/L    Glucose, Bld 124 (H) 70 - 99 mg/dL      Comment: Glucose reference range applies only to samples taken after fasting for at least 8 hours.    BUN 15 8 - 23 mg/dL    Creatinine, Ser 0.66 0.44 - 1.00 mg/dL    Calcium 8.0 (L) 8.9 - 10.3 mg/dL    GFR calc non Af Amer >60 >60 mL/min    GFR calc Af Amer >60 >60 mL/min    Anion gap 8 5 - 15      Comment: Performed at [facility] [address] [place], [place] [address]  Brain natriuretic peptide     Status: Abnormal    Collection Time: 07/21/19  2:00 PM  Result Value Ref Range    B Natriuretic Peptide 721.3 (H) 0.0 - 100.0 pg/mL      Comment: Performed at [facility] [address], [place], [place] [id number]  Basic metabolic panel     Status: Abnormal    Collection  Time: 07/22/19  5:30 AM  Result Value Ref Range    Sodium 139 135 - 145 mmol/L    Potassium 4.0 3.5 - 5.1 mmol/L    Chloride 96 (L) 98 - 111 mmol/L    CO2 37 (H) 22 - 32 mmol/L    Glucose, Bld 98 70 - 99 mg/dL      Comment: Glucose reference range applies only to samples taken after fasting for at least 8 hours.    BUN 12 8 - 23 mg/dL    Creatinine, Ser 0.57 0.44 - 1.00 mg/dL    Calcium 8.0 (L) 8.9 - 10.3 mg/dL    GFR calc non Af Amer >60 >60 mL/min    GFR calc Af Amer >60 >60 mL/min    Anion gap 6 5 - 15      Comment: Performed at [facility] [address] [place], [place] [address]  CBC     Status: Abnormal    Collection Time: 07/22/19  8:47 AM  Result Value Ref Range    WBC 7.2 4.0 - 10.5 K/uL    RBC 3.13 (L) 3.87 - 5.11 MIL/uL    Hemoglobin 9.4 (L)  12.0 - 15.0 g/dL    HCT 30.5 (L) 36.0 - 46.0 %    MCV 97.4 80.0 - 100.0 fL    MCH 30.0 26.0 - 34.0 pg    MCHC 30.8 30.0 - 36.0 g/dL    RDW 21.4 (H) 11.5 - 15.5 %    Platelets 298 150 - 400 K/uL    nRBC 0.0 0.0 - 0.2 %      Comment: Performed at [facility], [place] [address] [place], [place] [address]  CBC     Status: Abnormal    Collection Time: 07/23/19  1:06 AM  Result Value Ref Range    WBC 7.8 4.0 - 10.5 K/uL    RBC 2.94 (L) 3.87 - 5.11 MIL/uL    Hemoglobin 8.9 (L) 12.0 - 15.0 g/dL    HCT 28.4 (L) 36.0 - 46.0 %    MCV 96.6 80.0 - 100.0 fL    MCH 30.3 26.0 - 34.0 pg    MCHC 31.3 30.0 - 36.0 g/dL    RDW 21.5 (H) 11.5 - 15.5 %    Platelets 289 150 - 400 K/uL    nRBC 0.0 0.0 - 0.2 %      Comment: Performed at [facility], [place] [address], [place], [place] [address]      Imaging Results (Last 48 hours)  No results found.           Medical Problem List and Plan: 1.  Functional and mobility deficits secondary to debility after empyema, thoracotomy and associated deficts             -patient may not yet shower             -ELOS/Goals: mod I with PT, OT, SLP---10-14 days 2.  Antithrombotics: -DVT/anticoagulation:  Pharmaceutical: Other (comment)--Eliquis             -antiplatelet therapy: N/A 3. OA/Pain Management: has been using Celebrex bid for years. Tylenol prn 4. Mood: LCSW to follow for evaluation and support.              -antipsychotic agents: N/A 5. Neuropsych: This patient is capable of making decisions on her own behalf. 6. Skin/Wound Care: Routine pressure relief measures             -  barrier cream to sacral wounds             -JP drains remain. Continue until drainage decreases sufficiently 7. Fluids/Electrolytes/Nutrition: Monitor I/O. Offer supplements between meals.  8. Empyema s/p drainage: Continue to monitor output from drains. Colchicine added on 2/26  "to try to get tubes drier" 9. PAF: In NSR on amiodarone. Eliquis on board since 2/24 10  Fluid overload: Monitor weights daily. Continue lasix.             -heart failure team following 11. ABLA:  hgb 8.6 today and trending down 10.4--> 9.7-->9.4-->8.9 12. Severe protein calorie malnutrition: encourage PO, protein supps 13. COPD- oxygen dependent since for the past year but did not start using it till Sept. Respiratiory status stable on Dulera and Singulair.   14. Nausea/Vomiting: Has had stomach issues for year. N/V has resolved--continue Protonix--decreased to daily. Celebrex likely contributing to GI distress.        Bary Leriche, PA-C 07/23/2019  I have personally performed a face to face diagnostic evaluation of this patient and formulated the key components of the plan.  Additionally, I have personally reviewed laboratory data, imaging studies, as well as relevant notes and concur with the physician assistant's documentation above.  The patient's status has not changed from the original H&P.  Any changes in documentation from the acute care chart have been noted above.  Meredith Staggers, MD, [facility]rown

## 2019-07-24 NOTE — Progress Notes (Signed)
Pt got discharged to cardiac rehab, report provided to the receiving nurse. Pt's husband aware. Pt left the unit with all of her belongings.JP drains and midline is in position and clean, dry and intact during the time of transfer  Palma Holter, Therapist, sports

## 2019-07-25 ENCOUNTER — Inpatient Hospital Stay (HOSPITAL_COMMUNITY): Payer: Medicare Other | Admitting: Occupational Therapy

## 2019-07-25 ENCOUNTER — Inpatient Hospital Stay (HOSPITAL_COMMUNITY): Payer: Medicare Other

## 2019-07-25 ENCOUNTER — Inpatient Hospital Stay (HOSPITAL_COMMUNITY): Payer: Medicare Other | Admitting: Speech Pathology

## 2019-07-25 DIAGNOSIS — I480 Paroxysmal atrial fibrillation: Secondary | ICD-10-CM

## 2019-07-25 LAB — CBC WITH DIFFERENTIAL/PLATELET
Abs Immature Granulocytes: 0.03 10*3/uL (ref 0.00–0.07)
Basophils Absolute: 0.1 10*3/uL (ref 0.0–0.1)
Basophils Relative: 1 %
Eosinophils Absolute: 0.5 10*3/uL (ref 0.0–0.5)
Eosinophils Relative: 7 %
HCT: 27.9 % — ABNORMAL LOW (ref 36.0–46.0)
Hemoglobin: 8.5 g/dL — ABNORMAL LOW (ref 12.0–15.0)
Immature Granulocytes: 0 %
Lymphocytes Relative: 12 %
Lymphs Abs: 0.9 10*3/uL (ref 0.7–4.0)
MCH: 30.7 pg (ref 26.0–34.0)
MCHC: 30.5 g/dL (ref 30.0–36.0)
MCV: 100.7 fL — ABNORMAL HIGH (ref 80.0–100.0)
Monocytes Absolute: 0.9 10*3/uL (ref 0.1–1.0)
Monocytes Relative: 12 %
Neutro Abs: 4.8 10*3/uL (ref 1.7–7.7)
Neutrophils Relative %: 68 %
Platelets: 315 10*3/uL (ref 150–400)
RBC: 2.77 MIL/uL — ABNORMAL LOW (ref 3.87–5.11)
RDW: 21.9 % — ABNORMAL HIGH (ref 11.5–15.5)
WBC: 7.2 10*3/uL (ref 4.0–10.5)
nRBC: 0 % (ref 0.0–0.2)

## 2019-07-25 LAB — COMPREHENSIVE METABOLIC PANEL
ALT: 17 U/L (ref 0–44)
AST: 20 U/L (ref 15–41)
Albumin: 1.9 g/dL — ABNORMAL LOW (ref 3.5–5.0)
Alkaline Phosphatase: 50 U/L (ref 38–126)
Anion gap: 6 (ref 5–15)
BUN: 14 mg/dL (ref 8–23)
CO2: 33 mmol/L — ABNORMAL HIGH (ref 22–32)
Calcium: 8.3 mg/dL — ABNORMAL LOW (ref 8.9–10.3)
Chloride: 100 mmol/L (ref 98–111)
Creatinine, Ser: 0.6 mg/dL (ref 0.44–1.00)
GFR calc Af Amer: >60 mL/min (ref >60)
GFR calc non Af Amer: >60 mL/min (ref >60)
Glucose, Bld: 97 mg/dL (ref 70–99)
Potassium: 4.1 mmol/L (ref 3.5–5.1)
Sodium: 139 mmol/L (ref 135–145)
Total Bilirubin: 0.6 mg/dL (ref 0.3–1.2)
Total Protein: 5 g/dL — ABNORMAL LOW (ref 6.5–8.1)

## 2019-07-25 MED ORDER — FE FUMARATE-B12-VIT C-FA-IFC PO CAPS
1.0000 | ORAL_CAPSULE | Freq: Two times a day (BID) | ORAL | Status: DC
  Administered 2019-07-25 – 2019-08-03 (×18): 1 via ORAL
  Filled 2019-07-25 (×19): qty 1

## 2019-07-25 NOTE — Evaluation (Signed)
Occupational Therapy Assessment and Plan  Patient Details  Name: Connie Jarvis MRN: [id number] Date of Birth: July 18, 1940  OT Diagnosis: abnormal posture and muscle weakness (generalized) Rehab Potential: Rehab Potential (ACUTE ONLY): Excellent ELOS: 7-9 days   Today's Date: 07/25/2019 OT Individual Time: 1001-1104 OT Individual Time Calculation (min): 63 min     Problem List:  Patient Active Problem List   Diagnosis Date Noted  . Debility 07/24/2019  . Physical debility 07/24/2019  . Malnutrition of moderate degree 07/19/2019  . Pressure injury of skin 07/15/2019  . Empyema ([place]) 07/07/2019  . Pulmonary hypertension ([place]) 07/07/2019  . COPD with hypoxia ([place]) 07/07/2019  . Atrial fibrillation, chronic ([place]) 07/07/2019  . GERD (gastroesophageal reflux disease) 07/07/2019  . Essential hypertension 07/07/2019    Past Medical History:  Past Medical History:  Diagnosis Date  . Allergy   . Anemia   . Anxiety   . Arthritis   . Cataract    pt. not sure which eye early stage  . Depression   . GERD (gastroesophageal reflux disease)   . Hypertension    Past Surgical History:  Past Surgical History:  Procedure Laterality Date  . APPLICATION OF WOUND VAC Left 07/08/2019   Procedure: APPLICATION OF WOUND VAC;  Surgeon: Wonda Olds, MD;  Location: [facility];  Service: Thoracic;  Laterality: Left;  . APPLICATION OF WOUND VAC Left 07/11/2019   Procedure: WOUND VAC CHANGE AND WOUND IRRIGATION AND SHARP DEBRIDEMENT;  Surgeon: Wonda Olds, MD;  Location: [place];  Service: Thoracic;  Laterality: Left;  . APPLICATION OF WOUND VAC Left 07/13/2019   Procedure: APPLICATION OF WOUND VAC USING PREVENA DRESSING;  Surgeon: Wonda Olds, MD;  Location: [facility];  Service: Thoracic;  Laterality: Left;  . CENTRAL LINE INSERTION Left 07/08/2019   Procedure: CENTRAL LINE INSERTION;  Surgeon: Jolaine Artist, MD;  Location: [place] [facility];  Service: Cardiovascular;  Laterality: Left;  .  CHEST TUBE INSERTION Right 07/13/2019   Procedure: Chest Tube Insertion;  Surgeon: Wonda Olds, MD;  Location: [facility] OR;  Service: Thoracic;  Laterality: Right;  . COLONOSCOPY    . EMPYEMA DRAINAGE Left 07/08/2019   Procedure: EMPYEMA DRAINAGE;  Surgeon: Wonda Olds, MD;  Location: [facility];  Service: Thoracic;  Laterality: Left;  . GREATER OMENTAL FLAP CLOSURE Left 07/13/2019   Procedure: GREATER OMENTAL FLAP CLOSURE;  Surgeon: Wonda Olds, MD;  Location: [facility];  Service: Thoracic;  Laterality: Left;  . MOUTH SURGERY    . RIGHT/LEFT HEART CATH AND CORONARY ANGIOGRAPHY N/A 07/08/2019   Procedure: RIGHT/LEFT HEART CATH AND CORONARY ANGIOGRAPHY;  Surgeon: Jolaine Artist, MD;  Location: [place] [facility];  Service: Cardiovascular;  Laterality: N/A;  . THORACOTOMY Left 07/08/2019   Procedure: THORACOTOMY MAJOR;  Surgeon: Wonda Olds, MD;  Location: [facility];  Service: Thoracic;  Laterality: Left;  . THORACOTOMY Left 07/13/2019   Procedure: CLOSURE OF THORACOTOMY;  Surgeon: Wonda Olds, MD;  Location: [facility];  Service: Thoracic;  Laterality: Left;  . TONSILLECTOMY    . TUBAL LIGATION    . VASCULAR ASSESSMENT WITH SPY ELITE Left 07/13/2019   Procedure: Vascular Assessment With Spy Elite;  Surgeon: Wonda Olds, MD;  Location: [facility] OR;  Service: Thoracic;  Laterality: Left;    Assessment & Plan Clinical Impression: Patient is a 79 y.o. year old female with recent admission to the hospital on 07/07/19 with large empyema with compressive atelectasis. She underwent left and right  cath revealing mild PAH prior to being taken to OR the same day for major thoracotomy with drainage of empyema and application of wound VAC by Dr. Julien Girt.  Post op had issues with volume overload as well as A fib with RVR and started on IV heparin. IV amiodarone added for rate control and required norepinephrine for BP support. She was started on Vancomycin and Zosyn added on 2/16 as wound culture positive  for streptococcus intermedius.  She was taken back to OR on 2/14 for wound irrigation and debridement and ultimately underwent greater omental flap closure on 2/16 with placement of Prevena and right chest tube.  Patient transferred to CIR on 07/24/2019 .    Patient currently requires min with basic self-care skills secondary to muscle weakness, decreased cardiorespiratoy endurance and decreased oxygen support and decreased standing balance and decreased balance strategies.  Prior to hospitalization, patient could complete ADLs with modified independent .  Patient will benefit from skilled intervention to decrease level of assist with basic self-care skills and increase independence with basic self-care skills prior to discharge home with care partner.  Anticipate patient will require 24 hour supervision and follow up home health.  OT - End of Session Activity Tolerance: Decreased this session Endurance Deficit: Yes OT Assessment Rehab Potential (ACUTE ONLY): Excellent OT Patient demonstrates impairments in the following area(s): Balance;Endurance OT Basic ADL's Functional Problem(s): Grooming;Bathing;Dressing;Toileting OT Advanced ADL's Functional Problem(s): Simple Meal Preparation OT Transfers Functional Problem(s): Toilet;Tub/Shower OT Additional Impairment(s): None OT Plan OT Intensity: Minimum of 1-2 x/day, 45 to 90 minutes OT Frequency: 5 out of 7 days OT Duration/Estimated Length of Stay: 7-9 days OT Treatment/Interventions: Balance/vestibular training;Discharge planning;Self Care/advanced ADL retraining;Pain management;Therapeutic Activities;Cognitive remediation/compensation;Functional mobility training;Patient/family education;Therapeutic Exercise;UE/LE Coordination activities;Community reintegration;DME/adaptive equipment instruction;UE/LE Strength taining/ROM;Neuromuscular re-education;Wheelchair propulsion/positioning OT Self Feeding Anticipated Outcome(s): independent OT Basic  Self-Care Anticipated Outcome(s): supervision to modified independent OT Toileting Anticipated Outcome(s): modified independent OT Bathroom Transfers Anticipated Outcome(s): supervision OT Recommendation Patient destination: Home Follow Up Recommendations: Home health OT;24 hour supervision/assistance Equipment Recommended: To be determined   Skilled Therapeutic Intervention Mrs. Groom worked on Dance movement psychotherapist sit to stand at the sink during session.  She was able to transfer with hand held min assist to the wheelchair from the EOB.  She completed all UB selfcare with supervision from seated position.  Min assist was needed for LB selfcare secondary to needing assist with sit to stand and standing.  She was maintained on 3Ls nasal cannula during task with sats at 96% when checked.  It was difficult to obtain reading during activity, but dyspnea with standing during selfcare tasks was only at a 2/4 level.  Finished session with simulated toilet transfer using the rollator as this was what she has been using since September.  Min assist for transfer with min instructional cueing to remember to lock the brakes before sitting.  Discussed expectations for LOS at 7-9 days with supervision level goals overall.  Pt is excited to participate and go home as soon as she is ready.  Her spouse can provide 24 hour supervision.    OT Evaluation Precautions/Restrictions  Precautions Precautions: Fall Precaution Comments: LUQ bulb drains  Restrictions Weight Bearing Restrictions: No  Vital Signs Oxygen Therapy SpO2: 98 % O2 Device: Nasal Cannula O2 Flow Rate (L/min): 2 L/min FiO2 (%): 28 % Pain Pain Assessment Pain Scale: Faces Pain Score: 0-No pain Home Living/Prior Functioning Home Living Family/patient expects to be discharged to:: Private residence Living Arrangements: Spouse/significant other Available Help at  Discharge: Family, Available 24 hours/day Type of Home: House Home Access:  Ramped entrance, Stairs to enter Home Layout: One level, Laundry or work area in basement [employer] Shower/Tub: Chiropodist: Handicapped height Bathroom Accessibility: Yes  Lives With: Spouse IADL History Homemaking Responsibilities: Yes Meal Prep Responsibility: Secondary Cleaning Responsibility: Secondary Homemaking Comments: She and her husband share the duties before September 2020 when she started getting sick Current License: Yes Occupation: Retired Prior Function Level of Independence: Independent with basic ADLs, Requires assistive device for independence(would use the rollator or cane in the community prior to September.  Since then she has had to use the rollator in the house as well as out in the community.)  Able to Take Stairs?: Yes ADL ADL Eating: Independent Where Assessed-Eating: Wheelchair Grooming: Setup Where Assessed-Grooming: Wheelchair Upper Body Bathing: Setup Where Assessed-Upper Body Bathing: Wheelchair Lower Body Bathing: Minimal assistance Where Assessed-Lower Body Bathing: Wheelchair, Sitting at sink, Standing at sink Upper Body Dressing: Setup Where Assessed-Upper Body Dressing: Wheelchair Lower Body Dressing: Minimal assistance Where Assessed-Lower Body Dressing: Wheelchair, Sitting at sink, Standing at sink Toileting: Minimal assistance Where Assessed-Toileting: Bedside Commode Toilet Transfer: Minimal assistance Toilet Transfer Method: Counselling psychologist: Bedside commode Vision Baseline Vision/History: No visual deficits;Wears glasses Wears Glasses: Reading only(states she may need perscription glasses but doesn't have any) Patient Visual Report: No change from baseline Vision Assessment?: No apparent visual deficits Perception  Perception: Within Functional Limits Praxis Praxis: Intact Cognition Overall Cognitive Status: Within Functional Limits for tasks assessed Arousal/Alertness:  Awake/alert Orientation Level: Person;Place;Situation Person: Oriented Place: Oriented Situation: Oriented Year: 2021 Month: February Day of Week: Correct Memory: Appears intact Immediate Memory Recall: Sock;Blue;Bed Memory Recall Sock: Without Cue Memory Recall Blue: Without Cue Memory Recall Bed: Without Cue Attention: Sustained;Selective Sustained Attention: Appears intact Selective Attention: Appears intact Awareness: Appears intact Problem Solving: Appears intact Safety/Judgment: Appears intact Sensation Sensation Light Touch: Appears Intact Hot/Cold: Appears Intact Proprioception: Appears Intact Stereognosis: Appears Intact Additional Comments: sensation intact in BUEs Coordination Gross Motor Movements are Fluid and Coordinated: Yes Fine Motor Movements are Fluid and Coordinated: Yes Finger Nose Finger Test: Coordination intact in BUEs Motor  Motor Motor: Within Functional Limits Motor - Skilled Clinical Observations: generalized weakness Mobility  Bed Mobility Bed Mobility: Supine to Sit Supine to Sit: Supervision/Verbal cueing Transfers Sit to Stand: Minimal Assistance - Patient > 75% Stand to Sit: Minimal Assistance - Patient > 75%  Trunk/Postural Assessment  Cervical Assessment Cervical Assessment: Exceptions to WFL(cervical protraction) Thoracic Assessment Thoracic Assessment: Exceptions to WFL(slight thoracic rounding) Lumbar Assessment Lumbar Assessment: Exceptions to WFL(posterior pelvic tilt)  Balance Balance Balance Assessed: Yes Static Sitting Balance Static Sitting - Balance Support: Feet supported Static Sitting - Level of Assistance: 5: Stand by assistance Dynamic Sitting Balance Dynamic Sitting - Balance Support: During functional activity Dynamic Sitting - Level of Assistance: 5: Stand by assistance Static Standing Balance Static Standing - Level of Assistance: 4: Min assist Dynamic Standing Balance Dynamic Standing - Balance Support:  During functional activity Dynamic Standing - Level of Assistance: 3: Mod assist(No UE support) Extremity/Trunk Assessment RUE Assessment RUE Assessment: Exceptions to [facility] Active Range of Motion (AROM) Comments: WFLs for all joints General Strength Comments: 4-/5 throughout LUE Assessment LUE Assessment: Exceptions to [facility] Active Range of Motion (AROM) Comments: AROM WFLS for all joints General Strength Comments: 4-/5 throughout     Refer to Care Plan for Long Term Goals  Recommendations for other services: None    Discharge Criteria: Patient will  be discharged from OT if patient refuses treatment 3 consecutive times without medical reason, if treatment goals not met, if there is a change in medical status, if patient makes no progress towards goals or if patient is discharged from hospital.  The above assessment, treatment plan, treatment alternatives and goals were discussed and mutually agreed upon: by patient  Matison Nuccio OTR/L 07/25/2019, 12:57 PM

## 2019-07-25 NOTE — Progress Notes (Signed)
Meredith Staggers, MD  Physician  Physical Medicine and Rehabilitation  PMR Pre-admission  Signed  Date of Service:  07/23/2019  4:11 PM      Related encounter: Admission (Discharged) from 07/07/2019 in [place]        Show:Clear all [x] Manual[x] Template[x] Copied  Added by: [x] Cristina Gong, RN[x] Meredith Staggers, MD  [] Hover for details PMR Admission Coordinator Pre-Admission Assessment   Patient: Connie Jarvis is an 79 y.o., female MRN: [id number] DOB: 09-Dec-1940 Height: 5' (152.4 cm) Weight: 60.1 kg   Insurance Information HMO:    PPO:      PCP:      IPA:      80/20:      OTHER:  PRIMARY: Medicare a and b      Policy#: [id number]      Subscriber: pt Benefits:  Phone #: passport one online     Name: 2/26 Eff. Date: a 08/26/2007 and b 11/25/2007     Deduct: $1484      Out of Pocket Max: none      Life Max: none CIR: 100%      SNF: 20 full days Outpatient: 80%     Co-Pay: 20% Home Health: 100%      Co-Pay: none DME: 80%     Co-Pay: 20% Providers: pt choice  SECONDARY: [employer]      Policy#: [id number]      Subscriber: pt   Medicaid Application Date:       Case Manager:  Disability Application Date:       Case Worker:    The "Data Collection Information Summary" for patients in Inpatient Rehabilitation Facilities with attached "Privacy Act [place] Records" was provided and verbally reviewed with: Patient and Family   Emergency Contact Information         Contact Information     Name Relation Home Work Mobile    Hudson D   [phone]             Current Medical History  Patient Admitting Diagnosis: Debility   History of Present Illness:: Connie Jarvis is a 79 year old female with history of COPD-oxygen dependent, multiple episodes of bronchitis, depression, HTN, left upper chest lesion s/p I &D who was admitted on 07/07/19 with large empyema with compressive atelectasis. She underwent left and right cath  revealing mild PAH prior to being taken to OR the same day for major thoracotomy with drainage of empyema and application of wound VAC by Dr. Julien Girt.  Post op had issues with volume overload as well as A fib with RVR and started on IV heparin. IV amiodarone added for rate control and required norepinephrine for BP support. She was started on Vancomycin and Zosyn added on 2/16 as wound culture positive for streptococcus intermedius.  She was taken back to OR on 2/14 for wound irrigation and debridement and ultimately underwent greater omental flap closure on 2/16 with placement of Prevena and right chest tube. She has had issues with N/V and KUB negative for ileus but calcified 10 mm gallstone noted in RUQ. Dr. Haroldine Laws following for management of cardiac issues--she has been weaned off IV amiodarone and transitioned to po diuretics. Left chest wall wound with drains in place with moderate serous drainage. She has completed course of antibiotics 2/20 and Eliquis resumed 2/24. Nausea has resolved and intake improving.    Patient's medical record from [facility] [place] has been reviewed  by the rehabilitation admission coordinator and physician.   Past Medical History      Past Medical History:  Diagnosis Date  . Allergy    . Anemia    . Anxiety    . Arthritis    . Cataract      pt. not sure which eye early stage  . Depression    . GERD (gastroesophageal reflux disease)    . Hypertension        Family History   family history is not on file.   Prior Rehab/Hospitalizations Has the patient had prior rehab or hospitalizations prior to admission? Yes   Has the patient had major surgery during 100 days prior to admission? Yes              Current Medications   Current Facility-Administered Medications:  .  0.45 % sodium chloride infusion, , Intravenous, Continuous, Atkins, Glenice Bow, MD, Last Rate: 20.8 mL/hr at 07/17/19 0641, Rate Verify at 07/17/19 0641 .  0.9 %  sodium chloride infusion,  10 mL/hr, Intravenous, Once, Atkins, Glenice Bow, MD .  acetaminophen (TYLENOL) tablet 500 mg, 500 mg, Per Tube, Q6H PRN, Wonda Olds, MD, 500 mg at 07/18/19 2350 .  amiodarone (PACERONE) tablet 200 mg, 200 mg, Oral, BID, Lyda Jester M, PA-C, 200 mg at 07/23/19 2952 .  apixaban (ELIQUIS) tablet 5 mg, 5 mg, Oral, BID, Orvan Seen, Glenice Bow, MD, 5 mg at 07/23/19 0924 .  bisacodyl (DULCOLAX) EC tablet 10 mg, 10 mg, Oral, Daily PRN, Atkins, Broadus Z, MD .  celecoxib (CELEBREX) capsule 200 mg, 200 mg, Per Tube, BID, Orvan Seen, Glenice Bow, MD, 200 mg at 07/23/19 0924 .  chlorhexidine gluconate (MEDLINE KIT) (PERIDEX) 0.12 % solution 15 mL, 15 mL, Mouth Rinse, BID, Atkins, Glenice Bow, MD, 15 mL at 07/22/19 2107 .  Chlorhexidine Gluconate Cloth 2 % PADS 6 each, 6 each, Topical, Daily, Wonda Olds, MD, 6 each at 07/23/19 [phone] .  colchicine tablet 0.3 mg, 0.3 mg, Oral, Daily, Orvan Seen, Glenice Bow, MD, 0.3 mg at 07/23/19 1403 .  feeding supplement (ENSURE ENLIVE) (ENSURE ENLIVE) liquid 237 mL, 237 mL, Oral, TID BM, Atkins, Glenice Bow, MD, 237 mL at 07/23/19 1403 .  furosemide (LASIX) tablet 20 mg, 20 mg, Oral, Daily, Lyda Jester M, PA-C, 20 mg at 07/23/19 2440 .  Gerhardt's butt cream, , Topical, BID, Orvan Seen, Glenice Bow, MD, Given at 07/23/19 [phone] .  levalbuterol (XOPENEX) nebulizer solution 0.63 mg, 0.63 mg, Nebulization, Q6H PRN, Atkins, Broadus Z, MD .  loperamide HCl (IMODIUM) 1 MG/7.5ML suspension 1 mg, 1 mg, Oral, PRN, Atkins, Broadus Z, MD .  metoprolol tartrate (LOPRESSOR) injection 2.5 mg, 2.5 mg, Intravenous, Q8H PRN, Atkins, Broadus Z, MD .  mometasone-formoterol (DULERA) 100-5 MCG/ACT inhaler 2 puff, 2 puff, Inhalation, BID, Wonda Olds, MD, 2 puff at 07/23/19 0834 .  montelukast (SINGULAIR) tablet 10 mg, 10 mg, Per Tube, Daily, Orvan Seen, Glenice Bow, MD, 10 mg at 07/23/19 0924 .  multivitamin with minerals tablet 1 tablet, 1 tablet, Oral, Daily, Orvan Seen, Glenice Bow, MD, 1 tablet at  07/23/19 0924 .  ondansetron (ZOFRAN) injection 4 mg, 4 mg, Intravenous, Q6H PRN, Wonda Olds, MD, 4 mg at 07/18/19 0534 .  pantoprazole (PROTONIX) EC tablet 40 mg, 40 mg, Oral, Daily, Orvan Seen, Glenice Bow, MD, 40 mg at 07/23/19 0924 .  promethazine (PHENERGAN) injection 6.25 mg, 6.25 mg, Intravenous, Q8H PRN, Atkins, Broadus Z, MD .  sodium chloride flush (NS) 0.9 % injection 10-40  mL, 10-40 mL, Intracatheter, PRN, Wonda Olds, MD, 10 mL at 07/20/19 2129 .  traZODone (DESYREL) tablet 25 mg, 25 mg, Per Tube, QHS PRN, Wonda Olds, MD, 25 mg at 07/11/19 2131   Patients Current Diet:     Diet Order                      Diet gluten free Room service appropriate? Yes; Fluid consistency: Thin; Fluid restriction: 1800 mL Fluid  Diet effective now                   Precautions / Restrictions Precautions Precautions: Fall Precaution Comments: LUQ bulb drains  Restrictions Weight Bearing Restrictions: No RUE Weight Bearing: Non weight bearing    Has the patient had 2 or more falls or a fall with injury in the past year? No   Prior Activity Level Community (5-7x/wk): Mod I with rollator   Prior Functional Level Self Care: Did the patient need help bathing, dressing, using the toilet or eating? Independent   Indoor Mobility: Did the patient need assistance with walking from room to room (with or without device)? Independent   Stairs: Did the patient need assistance with internal or external stairs (with or without device)? Needed some help   Functional Cognition: Did the patient need help planning regular tasks such as shopping or remembering to take medications? Needed some help   Home Assistive Devices / [place] Devices/Equipment: Gilford Rile (specify type) Home Equipment: Walker - 4 wheels, Grab bars - tub/shower, Grab bars - toilet   Prior Device Use: Indicate devices/aids used by the patient prior to current illness, exacerbation or injury? Walker     Current Functional Level Cognition   Overall Cognitive Status: Within Functional Limits for tasks assessed Orientation Level: Oriented X4    Extremity Assessment (includes Sensation/Coordination)   Upper Extremity Assessment: Generalized weakness  Lower Extremity Assessment: Generalized weakness     ADLs         Mobility   Overal bed mobility: Needs Assistance Bed Mobility: Rolling, Sidelying to Sit Rolling: Supervision Sidelying to sit: Min guard Supine to sit: Min assist Sit to supine: Mod assist General bed mobility comments: pt OOB in chair upon arrival     Transfers   Overall transfer level: Needs assistance Equipment used: Rolling walker (2 wheeled) Transfers: Sit to/from Stand Sit to Stand: Min assist, Min guard Stand pivot transfers: Mod assist General transfer comment: assist to steady upon standing; no assist to power up into standing     Ambulation / Gait / Stairs / Wheelchair Mobility   Ambulation/Gait Ambulation/Gait assistance: Min guard, Min assist Gait Distance (Feet): 140 Feet Assistive device: Rolling walker (2 wheeled) Gait Pattern/deviations: Step-through pattern, Decreased stride length, Narrow base of support, Trunk flexed General Gait Details: cues for upright posture/forward gaze, safe proximity to RW, and PLB; standing breaks due to DOE Gait velocity: reduced Gait velocity interpretation: <1.8 ft/sec, indicate of risk for recurrent falls     Posture / Balance Dynamic Sitting Balance Sitting balance - Comments: minG-close supervision Balance Overall balance assessment: Needs assistance Sitting-balance support: Single extremity supported, Feet supported Sitting balance-Leahy Scale: Fair Sitting balance - Comments: minG-close supervision Postural control: Posterior lean Standing balance support: Bilateral upper extremity supported Standing balance-Leahy Scale: Poor Standing balance comment: minA with BUE support of RW     Special needs/care  consideration BiPAP/CPAP  CPM  Continuous Drip IV  Dialysis  Life Vest  Oxygen on home ) 2 at baseline; room air on 2/26 Special Bed  Trach Size  Wound Vac left lateal upper chest WOUND VAC placed 2/16 Skin left chest incision with 2 JP drains; abdominal incision,; right chest incision; sacral stage 1; buttocks medical stage 2 Bowel mgmt: continent Bladder mgmt: external catheter Diabetic mgmt:  Behavioral consideration  Chemo/radiation  designated visitor is spouse, Eddie Dibbles    Previous Home Environment  Living Arrangements: Spouse/significant other  Lives With: Spouse Available Help at Discharge: Family, Available 24 hours/day Type of Home: House Home Layout: One level, Laundry or work area in basement Home Access: Ramped entrance, Stairs to enter Technical brewer of Steps: 6 Bathroom Shower/Tub: Chiropodist: Handicapped height [place] Accessibility: Yes How Accessible: Accessible via walker [place]: Yes Type of [place]: [place] (if known): [facility]   Discharge Living Setting Plans for Discharge Living Setting: Patient's home, Lives with (comment)(spouse) Type of Home at Discharge: House Discharge Home Layout: One level, Laundry or work area in basement Discharge Home Access: Stairs to enter Technical brewer of Steps: 6 Discharge Bathroom Shower/Tub: Tub/shower unit Discharge Bathroom Toilet: Handicapped height Discharge Bathroom Accessibility: Yes How Accessible: Accessible via walker Does the patient have any problems obtaining your medications?: No   Social/Family/Support Systems Patient Roles: Spouse Contact Information: spouse cell [phone] Anticipated Caregiver: spouse Anticipated Caregiver's Contact Information: home [phone]; cell [phone] Ability/Limitations of Caregiver: no limitations Caregiver Availability: 24/7 Discharge Plan Discussed with Primary  Caregiver: Yes Is Caregiver In Agreement with Plan?: Yes Does Caregiver/Family have Issues with Lodging/Transportation while Pt is in Rehab?: No   Goals/Additional Needs Patient/Family Goal for Rehab: Mod I to supervision with PT, OT, and SLP Expected length of stay: ELOS 10 to 14 days Equipment Needs: wound vac and JP drains Additional Information: HOH Pt/Family Agrees to Admission and willing to participate: Yes Program Orientation Provided & Reviewed with Pt/Caregiver Including Roles  & Responsibilities: Yes   Decrease burden of Care through IP rehab admission:    Possible need for SNF placement upon discharge:    Patient Condition: I have reviewed medical records from [facility] , spoken with CM, and patient and spouse. I met with patient at the bedside for inpatient rehabilitation assessment.  Patient will benefit from ongoing PT, OT and SLP, can actively participate in 3 hours of therapy a day 5 days of the week, and can make measurable gains during the admission.  Patient will also benefit from the coordinated team approach during an Inpatient Acute Rehabilitation admission.  The patient will receive intensive therapy as well as Rehabilitation physician, nursing, social worker, and care management interventions.  Due to bladder management, bowel management, safety, skin/wound care, disease management, medication administration, pain management and patient education the patient requires 24 hour a day rehabilitation nursing.  The patient is currently min to mod assist with mobility and basic ADLs.  Discharge setting and therapy post discharge at home with home health is anticipated.  Patient has agreed to participate in the Acute Inpatient Rehabilitation Program and will admit Saturday when bed is available 07/24/2019.   Preadmission Screen Completed By:  Cleatrice Burke, 07/23/2019 4:11 PM ______________________________________________________________________   Discussed  status with Dr. Naaman Plummer  on  07/23/2019 at 1600 and received approval for admission Saturday 07/24/2019 when bed is available.   Admission Coordinator:  Cleatrice Burke, RN, time  2703 Date  07/23/2019    Assessment/Plan: Diagnosis: debility after  empyema/thoracotomy and associated complications 1. Does the need for close, 24 hr/day Medical supervision in concert with the patient's rehab needs make it unreasonable for this patient to be served in a less intensive setting? Yes 2. Co-Morbidities requiring supervision/potential complications: COPD, HTN, afib post-op wounds/care 3. Due to bladder management, bowel management, safety, skin/wound care, disease management, medication administration, pain management and patient education, does the patient require 24 hr/day rehab nursing? Yes 4. Does the patient require coordinated care of a physician, rehab nurse, PT, OT, and SLP to address physical and functional deficits in the context of the above medical diagnosis(es)? Yes Addressing deficits in the following areas: balance, endurance, locomotion, strength, transferring, bowel/bladder control, bathing, dressing, feeding, grooming and psychosocial support 5. Can the patient actively participate in an intensive therapy program of at least 3 hrs of therapy 5 days a week? Yes 6. The potential for patient to make measurable gains while on inpatient rehab is excellent 7. Anticipated functional outcomes upon discharge from inpatient rehab: modified independent and supervision PT, modified independent and supervision OT, modified independent and supervision SLP 8. Estimated rehab length of stay to reach the above functional goals is: 10-14 days 9. Anticipated discharge destination: Home 10. Overall Rehab/Functional Prognosis: excellent     MD Signature: Meredith Staggers, MD, [place] [facility] 07/24/2019         Revision History

## 2019-07-25 NOTE — Evaluation (Signed)
Physical Therapy Assessment and Plan  Patient Details  Name: Connie Jarvis MRN: [id number] Date of Birth: 1940-10-01  PT Diagnosis: Abnormal posture, Difficulty walking, Edema, Muscle weakness, Osteoarthritis and Pain in joint Rehab Potential: Good ELOS: 7-9 days   Today's Date: 07/25/2019 PT Individual Time: 1300-1406 PT Individual Time Calculation (min): 66 min    Problem List:  Patient Active Problem List   Diagnosis Date Noted  . Debility 07/24/2019  . Physical debility 07/24/2019  . Malnutrition of moderate degree 07/19/2019  . Pressure injury of skin 07/15/2019  . Empyema ([place]) 07/07/2019  . Pulmonary hypertension ([place]) 07/07/2019  . COPD with hypoxia ([place]) 07/07/2019  . Atrial fibrillation, chronic ([place]) 07/07/2019  . GERD (gastroesophageal reflux disease) 07/07/2019  . Essential hypertension 07/07/2019    Past Medical History:  Past Medical History:  Diagnosis Date  . Allergy   . Anemia   . Anxiety   . Arthritis   . Cataract    pt. not sure which eye early stage  . Depression   . GERD (gastroesophageal reflux disease)   . Hypertension    Past Surgical History:  Past Surgical History:  Procedure Laterality Date  . APPLICATION OF WOUND VAC Left 07/08/2019   Procedure: APPLICATION OF WOUND VAC;  Surgeon: Wonda Olds, MD;  Location: [facility];  Service: Thoracic;  Laterality: Left;  . APPLICATION OF WOUND VAC Left 07/11/2019   Procedure: WOUND VAC CHANGE AND WOUND IRRIGATION AND SHARP DEBRIDEMENT;  Surgeon: Wonda Olds, MD;  Location: [place];  Service: Thoracic;  Laterality: Left;  . APPLICATION OF WOUND VAC Left 07/13/2019   Procedure: APPLICATION OF WOUND VAC USING PREVENA DRESSING;  Surgeon: Wonda Olds, MD;  Location: [facility];  Service: Thoracic;  Laterality: Left;  . CENTRAL LINE INSERTION Left 07/08/2019   Procedure: CENTRAL LINE INSERTION;  Surgeon: Jolaine Artist, MD;  Location: [place] [facility];  Service: Cardiovascular;  Laterality: Left;   . CHEST TUBE INSERTION Right 07/13/2019   Procedure: Chest Tube Insertion;  Surgeon: Wonda Olds, MD;  Location: [facility];  Service: Thoracic;  Laterality: Right;  . COLONOSCOPY    . EMPYEMA DRAINAGE Left 07/08/2019   Procedure: EMPYEMA DRAINAGE;  Surgeon: Wonda Olds, MD;  Location: [facility];  Service: Thoracic;  Laterality: Left;  . GREATER OMENTAL FLAP CLOSURE Left 07/13/2019   Procedure: GREATER OMENTAL FLAP CLOSURE;  Surgeon: Wonda Olds, MD;  Location: [facility];  Service: Thoracic;  Laterality: Left;  . MOUTH SURGERY    . RIGHT/LEFT HEART CATH AND CORONARY ANGIOGRAPHY N/A 07/08/2019   Procedure: RIGHT/LEFT HEART CATH AND CORONARY ANGIOGRAPHY;  Surgeon: Jolaine Artist, MD;  Location: [place] [facility];  Service: Cardiovascular;  Laterality: N/A;  . THORACOTOMY Left 07/08/2019   Procedure: THORACOTOMY MAJOR;  Surgeon: Wonda Olds, MD;  Location: [facility];  Service: Thoracic;  Laterality: Left;  . THORACOTOMY Left 07/13/2019   Procedure: CLOSURE OF THORACOTOMY;  Surgeon: Wonda Olds, MD;  Location: [facility];  Service: Thoracic;  Laterality: Left;  . TONSILLECTOMY    . TUBAL LIGATION    . VASCULAR ASSESSMENT WITH SPY ELITE Left 07/13/2019   Procedure: Vascular Assessment With Spy Elite;  Surgeon: Wonda Olds, MD;  Location: [facility] OR;  Service: Thoracic;  Laterality: Left;    Assessment & Plan Clinical Impression: Patient is a 79 y.o. year old female with history of COPD-oxygen dependent, multiple episodes of bronchitis, depression, HTN, left upper chest lesion s/p I &D  who was admitted on 07/07/19 with large empyema with compressive atelectasis. She underwent left and right cath revealing mild PAH prior to being taken to OR the same day for major thoracotomy with drainage of empyema and application of wound VAC by Dr. Julien Girt. Post op had issues with volume overload as well as A fib with RVR and started on IV heparin. IV amiodarone added for rate control and required  norepinephrine for BP support. She was started on Vancomycin and Zosyn added on 2/16 as wound culture positive for streptococcus intermedius. She was taken back to OR on 2/14 for wound irrigation and debridement and ultimately underwent greater omental flap closure on 2/16 with placement of Prevena and right chest tube. She has had issues with N/V and KUB negative for ileus but calcified 10 mm gallstone noted in RUQ. Dr. Haroldine Laws following for management of cardiac issues--she has been weaned off IV amiodarone and transitioned to po diuretics. Left chest wall wound with drains in place with moderate serous drainage. She has completed course of antibiotics 2/20 and Eliquis resumed 2/24. Nausea has resolved and intake improving. Patient noted to be debilitated and CIR recommended due to functional deficits.Patient transferred to CIR on 07/24/2019 .   Patient currently requires min with mobility secondary to muscle weakness and muscle joint tightness, decreased cardiorespiratoy endurance and decreased oxygen support and decreased standing balance and decreased balance strategies.  Prior to hospitalization, patient was modified independent  with mobility and lived with Spouse in a House home.  Home access is  Ramped entrance, Stairs to enter.  Patient will benefit from skilled PT intervention to maximize safe functional mobility, minimize fall risk and decrease caregiver burden for planned discharge home with 24 hour supervision.  Anticipate patient will benefit from follow up [place] at discharge.  PT - End of Session Activity Tolerance: Decreased this session;Tolerates 30+ min activity with multiple rests Endurance Deficit: Yes Endurance Deficit Description: supplemental O2 via [employer]; 2L PT Assessment Rehab Potential (ACUTE/IP ONLY): Good PT Patient demonstrates impairments in the following area(s): Balance;Edema;Endurance;Motor;Pain;Skin Integrity PT Transfers Functional Problem(s): Bed to Chair;Bed  Mobility;Car;Furniture PT Locomotion Functional Problem(s): Ambulation;Stairs PT Plan PT Intensity: Minimum of 1-2 x/day ,45 to 90 minutes PT Frequency: 5 out of 7 days PT Duration Estimated Length of Stay: 7-9 days PT Treatment/Interventions: Ambulation/gait training;Balance/vestibular training;Community reintegration;Discharge planning;Disease management/prevention;DME/adaptive equipment instruction;Functional mobility training;Neuromuscular re-education;Pain management;Patient/family education;Psychosocial support;Skin care/wound management;Splinting/orthotics;Stair training;Therapeutic Activities;Therapeutic Exercise;UE/LE Strength taining/ROM;Wheelchair propulsion/positioning;UE/LE Coordination activities PT Transfers Anticipated Outcome(s): mod I basic household; supervision car PT Locomotion Anticipated Outcome(s): mod I household distances; supervision longer due to O2 management; min assist stairs PT Recommendation Follow Up Recommendations: Home health PT Patient destination: Home Equipment Recommended: Wheelchair (measurements);Wheelchair cushion (measurements)(for community mobility) Equipment Details: pt has rollator at home  Skilled Therapeutic Intervention Evaluation completed (see details above and below) with education on PT POC and goals and individual treatment initiated with focus on transfer training, stair negotiation training, simulated car transfer, ramp negotiation for home entry practice, gait training with rollator, and education on energy conservation techniques. Pt performs functional transfers with overall min assist with cues for hand placement and assist for balance and power up especially when low surface. Progressing to CGA by end of session with improved technique. Familiar with use of rollator so safe with break management during transfers. Pt able to progress gait up to bouts of about 50' at a time with seated rest breaks due to fatigue and increased work of  breathing. Cues and education on diaphragmatic breathing  techniques. Fitted w/c with elevating legrests for edema control and suggested use of recliner as an alternate option as well. Pt very open and receptive to all feedback.   PT Evaluation Precautions/Restrictions Precautions Precautions: Fall Precaution Comments: LUQ bulb drains; chronic O2 via [place] (2L) Restrictions Weight Bearing Restrictions: No    Vital Signs Pulse Rate: 82 Patient Position (if appropriate): Sitting Oxygen Therapy SpO2: 100 % O2 Device: Nasal Cannula Pain  Reports some discomfort in knees/ankles with mobility due to arthritis. No medication needed. Home Living/Prior Functioning Home Living Available Help at Discharge: Family;Available 24 hours/day Type of Home: House Home Access: Ramped entrance;Stairs to enter Home Layout: One level;Laundry or work area in basement [employer] Shower/Tub: Chiropodist: Handicapped height Bathroom Accessibility: Yes  Lives With: Spouse Prior Function Level of Independence: Independent with gait;Independent with transfers;Requires assistive device for independence;Independent with basic ADLs  Able to Take Stairs?: Yes Driving: (up until Sept) Comments: would use the rollator in the community prior to September.  Since then she has had to use the rollator in the house as well as out in the community. Vision/Perception  Perception Perception: Within Functional Limits Praxis Praxis: Intact  Cognition Overall Cognitive Status: Within Functional Limits for tasks assessed Arousal/Alertness: Awake/alert Attention: Sustained;Selective Sustained Attention: Appears intact Selective Attention: Appears intact Memory: Appears intact Immediate Memory Recall: Sock;Blue;Bed Memory Recall Sock: Without Cue Memory Recall Blue: Without Cue Memory Recall Bed: Without Cue Awareness: Appears intact Problem Solving: Appears intact Safety/Judgment: Appears  intact Sensation Sensation Light Touch: Appears Intact Hot/Cold: Appears Intact Proprioception: Appears Intact Stereognosis: Appears Intact Additional Comments: reports slight tingling in BLE; sensation to LT intact Coordination Gross Motor Movements are Fluid and Coordinated: Yes Fine Motor Movements are Fluid and Coordinated: Yes Finger Nose Finger Test: Coordination intact in BUEs Motor  Motor Motor: Within Functional Limits Motor - Skilled Clinical Observations: generalized weakness  Mobility Bed Mobility Bed Mobility: Supine to Sit Supine to Sit: Supervision/Verbal cueing Transfers Transfers: Sit to Stand;Stand to Sit;Stand Pivot Transfers Sit to Stand: Minimal Assistance - Patient > 75% Stand to Sit: Minimal Assistance - Patient > 75% Stand Pivot Transfers: Minimal Assistance - Patient > 75% Transfer (Assistive device): Landscape architect (Feet): 50 Feet Assistive device: Rollator Gait Gait Pattern: Impaired Stairs / Additional Locomotion Stairs: Yes Stairs Assistance: Moderate Assistance - Patient 50 - 74% Stair Management Technique: Two rails;Step to pattern Number of Stairs: 2 Height of Stairs: 6 Ramp: Minimal Assistance - Patient >75% Wheelchair Mobility Wheelchair Mobility: No  Trunk/Postural Assessment  Cervical Assessment Cervical Assessment: Exceptions to WFL(cervical protraction) Thoracic Assessment Thoracic Assessment: Exceptions to WFL(slight thoracic rounding) Lumbar Assessment Lumbar Assessment: Exceptions to WFL(posterior pelvic tilt)  Balance Balance Balance Assessed: Yes Static Sitting Balance Static Sitting - Balance Support: Feet supported Static Sitting - Level of Assistance: 5: Stand by assistance Dynamic Sitting Balance Dynamic Sitting - Balance Support: During functional activity Dynamic Sitting - Level of Assistance: 5: Stand by assistance Static Standing Balance Static Standing - Level of Assistance: 4: Min  assist Dynamic Standing Balance Dynamic Standing - Balance Support: During functional activity Dynamic Standing - Level of Assistance: 3: Mod assist Extremity Assessment  RUE Assessment RUE Assessment: Exceptions to [facility] Active Range of Motion (AROM) Comments: WFLs for all joints General Strength Comments: 4-/5 throughout LUE Assessment LUE Assessment: Exceptions to [facility] Active Range of Motion (AROM) Comments: AROM WFLS for all joints General Strength Comments: 4-/5 throughout RLE Assessment RLE Assessment: Exceptions to [facility] Active Range  of Motion (AROM) Comments: pt reports toes often curl under and can had difficulty with getting into shoes General Strength Comments: grossly 3+/5; significant arthritis/crackling of joints premorbid;(mild edema) LLE Assessment LLE Assessment: Exceptions to [facility] General Strength Comments: grossly 3+/5 knee and ankle; 3-/5 at hip; significant arthiritis/crackling of joints especially ankle and knee(mild edema)    Refer to Care Plan for Long Term Goals  Recommendations for other services: None   Discharge Criteria: Patient will be discharged from PT if patient refuses treatment 3 consecutive times without medical reason, if treatment goals not met, if there is a change in medical status, if patient makes no progress towards goals or if patient is discharged from hospital.  The above assessment, treatment plan, treatment alternatives and goals were discussed and mutually agreed upon: by patient and by family  Juanna Cao, PT, DPT, CBIS  07/25/2019, 2:55 PM

## 2019-07-25 NOTE — Plan of Care (Signed)
  Problem: Consults Goal: RH GENERAL PATIENT EDUCATION Description: See Patient Education module for education specifics. Outcome: Progressing   Problem: RH SKIN INTEGRITY Goal: RH STG SKIN FREE OF INFECTION/BREAKDOWN Description: Skin remain free of further breakdown with mod assist Outcome: Progressing Goal: RH STG ABLE TO PERFORM INCISION/WOUND CARE W/ASSISTANCE Description: STG Able To Perform Incision/Wound Care With Assistance. Mod Outcome: Progressing

## 2019-07-25 NOTE — Progress Notes (Signed)
[place] PHYSICAL MEDICINE & REHABILITATION PROGRESS NOTE   Subjective/Complaints: Had a good night. jp bulbs just drained before I came in. Slept most of the evening.   ROS: Patient denies fever, rash, sore throat, blurred vision, nausea, vomiting, diarrhea, cough, shortness of breath   joint or back pain, headache, or mood change.     Objective:   No results found. Recent Labs    07/24/19 0224 07/25/19 0348  WBC 8.2 7.2  HGB 8.6* 8.5*  HCT 27.4* 27.9*  PLT 310 315   Recent Labs    07/25/19 0348  NA 139  K 4.1  CL 100  CO2 33*  GLUCOSE 97  BUN 14  CREATININE 0.60  CALCIUM 8.3*    Intake/Output Summary (Last 24 hours) at 07/25/2019 1004 Last data filed at 07/25/2019 0837 Gross per 24 hour  Intake 580 ml  Output 255 ml  Net 325 ml     Physical Exam: Vital Signs Blood pressure (!) 114/52, pulse 76, temperature 98.3 F (36.8 C), height 5' (1.524 m), weight 60.5 kg, SpO2 98 %. Constitutional: No distress . Vital signs reviewed. HEENT: EOMI, oral membranes moist Neck: supple Cardiovascular: RRR without murmur. No JVD    Respiratory/Chest: CTA Bilaterally without wheezes or rales. Normal effort. Left sided chest incision/drain systems clean/dry. Decreased sounds at bases left more than right   GI/Abdomen: BS +,  tender, non-distended, abd wound with decreased bloody drainage (old blood) Ext: no clubbing, cyanosis, or edema Musculoskeletal:  General: Edema(1+ pitting edema bilateral shins)present.  Cervical back: Normal range of motion.  Comments:  Pes planus deformities bilaterally R>L Neurological: She is alertand oriented to person, place, and time. Dysphonia noted--mild. More prominent when she fatigues. Otherwise CN intact. UE motor 4/5 proximal to distal. LE: 3/5 HF, KE and 2-3/5 ADF/PF. Sensed pain and light touch in both feet. Skin: She isnot diaphoretic. Noerythema. 2 stage II areas on sacrum measuring about 3-4 cm, minimal skin  break, more peeling of skin.    Assessment/Plan: 1. Functional deficits secondary to debility which require 3+ hours per day of interdisciplinary therapy in a comprehensive inpatient rehab setting.  Physiatrist is providing close team supervision and 24 hour management of active medical problems listed below.  Physiatrist and rehab team continue to assess barriers to discharge/monitor patient progress toward functional and medical goals  Care Tool:  Bathing              Bathing assist       Upper Body Dressing/Undressing Upper body dressing        Upper body assist      Lower Body Dressing/Undressing Lower body dressing      What is the patient wearing?: Underwear/pull up     Lower body assist Assist for lower body dressing: Minimal Assistance - Patient > 75%     Toileting Toileting    Toileting assist Assist for toileting: Supervision/Verbal cueing     Transfers Chair/bed transfer  Transfers assist     Chair/bed transfer assist level: Supervision/Verbal cueing     Locomotion Ambulation   Ambulation assist              Walk 10 feet activity   Assist           Walk 50 feet activity   Assist           Walk 150 feet activity   Assist           Walk 10 feet on uneven surface  activity   Assist           Wheelchair     Assist               Wheelchair 50 feet with 2 turns activity    Assist            Wheelchair 150 feet activity     Assist          Blood pressure (!) 114/52, pulse 76, temperature 98.3 F (36.8 C), height 5' (1.524 m), weight 60.5 kg, SpO2 98 %.  Medical Problem List and Plan: 1.Functional and mobility deficitssecondary to debility after empyema, thoracotomy and associated deficts -patient maynot yetshower -ELOS/Goals: mod I with PT, OT, SLP---10-14 days  -beginning therapies today 2.  Antithrombotics: -DVT/anticoagulation:Pharmaceutical:Other (comment)--Eliquis -antiplatelet therapy: N/A 3.OA/Pain Management:has been using Celebrex bid for years.Tylenol prn 4. Mood:LCSW to follow for evaluation and support. -antipsychotic agents: N/A 5. Neuropsych: This patientiscapable of making decisions on herown behalf. 6. Skin/Wound Care:Routine pressure relief measures -barrier cream to sacral wounds -JP sites clean and dry 7. Fluids/Electrolytes/Nutrition:Monitor I/O. Offer supplements between meals.  8. Empyema s/p drainage:  Colchicine added on 2/26 "to try to get tubes drier"  -both JP's drained about 180cc over last 24 hours  -continue per surgery team 9. PAF: In NSR on amiodarone. Eliquis on board since 2/24 10 Fluid overload: Monitor weights daily. Continue lasix. -heart failure team following 11. ABLA:  2/28: trending down 10.4-->9.7-->9.4-->8.9-->8.6 --> 8.5 2/28   -requested stool sample for OB  -mild blood loss from drains  Continue to follow serially  -fe++ supp 12. Severe protein calorie malnutrition:encourage PO, protein supps 13. COPD- oxygen dependentsince for the past year but did not start using it till Sept.Respiratiory status stable on Dulera and Singulair.  14. Nausea/Vomiting:Has had stomach issues for year. N/V has resolved--continue Protonix--decreased to daily.Celebrex likely contributing to GI distress.    LOS: 1 days A FACE TO FACE EVALUATION WAS PERFORMED  Connie Jarvis 07/25/2019, 10:04 AM

## 2019-07-25 NOTE — Evaluation (Signed)
Speech Language Pathology Assessment and Plan  Patient Details  Name: Connie Jarvis MRN: [id number] Date of Birth: 1941-03-02  SLP Diagnosis:   n/a Rehab Potential: Good ELOS: n/a for ST    Today's Date: 07/25/2019 SLP Individual Time: 6384-5364 SLP Individual Time Calculation (min): 20 min   Problem List:  Patient Active Problem List   Diagnosis Date Noted  . Debility 07/24/2019  . Physical debility 07/24/2019  . Malnutrition of moderate degree 07/19/2019  . Pressure injury of skin 07/15/2019  . Empyema ([place]) 07/07/2019  . Pulmonary hypertension ([place]) 07/07/2019  . COPD with hypoxia ([place]) 07/07/2019  . Atrial fibrillation, chronic ([place]) 07/07/2019  . GERD (gastroesophageal reflux disease) 07/07/2019  . Essential hypertension 07/07/2019   Past Medical History:  Past Medical History:  Diagnosis Date  . Allergy   . Anemia   . Anxiety   . Arthritis   . Cataract    pt. not sure which eye early stage  . Depression   . GERD (gastroesophageal reflux disease)   . Hypertension    Past Surgical History:  Past Surgical History:  Procedure Laterality Date  . APPLICATION OF WOUND VAC Left 07/08/2019   Procedure: APPLICATION OF WOUND VAC;  Surgeon: Wonda Olds, MD;  [facility];  Service: Thoracic;  Laterality: Left;  . APPLICATION OF WOUND VAC Left 07/11/2019   Procedure: WOUND VAC CHANGE AND WOUND IRRIGATION AND SHARP DEBRIDEMENT;  Surgeon: Wonda Olds, MD;  Location: [place];  Service: Thoracic;  Laterality: Left;  . APPLICATION OF WOUND VAC Left 07/13/2019   Procedure: APPLICATION OF WOUND VAC USING PREVENA DRESSING;  Surgeon: Wonda Olds, MD;  [facility];  Service: Thoracic;  Laterality: Left;  . CENTRAL LINE INSERTION Left 07/08/2019   Procedure: CENTRAL LINE INSERTION;  Surgeon: Jolaine Artist, MD;  Location: [place] [facility];  Service: Cardiovascular;  Laterality: Left;  . CHEST TUBE INSERTION Right 07/13/2019   Procedure: Chest Tube Insertion;   Surgeon: Wonda Olds, MD;  Location: [facility] OR;  Service: Thoracic;  Laterality: Right;  . COLONOSCOPY    . EMPYEMA DRAINAGE Left 07/08/2019   Procedure: EMPYEMA DRAINAGE;  Surgeon: Wonda Olds, MD;  [facility];  Service: Thoracic;  Laterality: Left;  . GREATER OMENTAL FLAP CLOSURE Left 07/13/2019   Procedure: GREATER OMENTAL FLAP CLOSURE;  Surgeon: Wonda Olds, MD;  [facility];  Service: Thoracic;  Laterality: Left;  . MOUTH SURGERY    . RIGHT/LEFT HEART CATH AND CORONARY ANGIOGRAPHY N/A 07/08/2019   Procedure: RIGHT/LEFT HEART CATH AND CORONARY ANGIOGRAPHY;  Surgeon: Jolaine Artist, MD;  Location: [place] [facility];  Service: Cardiovascular;  Laterality: N/A;  . THORACOTOMY Left 07/08/2019   Procedure: THORACOTOMY MAJOR;  Surgeon: Wonda Olds, MD;  [facility];  Service: Thoracic;  Laterality: Left;  . THORACOTOMY Left 07/13/2019   Procedure: CLOSURE OF THORACOTOMY;  Surgeon: Wonda Olds, MD;  [facility];  Service: Thoracic;  Laterality: Left;  . TONSILLECTOMY    . TUBAL LIGATION    . VASCULAR ASSESSMENT WITH SPY ELITE Left 07/13/2019   Procedure: Vascular Assessment With Spy Elite;  Surgeon: Wonda Olds, MD;  Location: [facility] OR;  Service: Thoracic;  Laterality: Left;    Assessment / Plan / Recommendation Clinical Impression   Connie Jarvis is a 79 year old female with history of COPD-oxygen dependent, multiple episodes of bronchitis, depression, HTN, left upper chest lesion s/p I &D who was admitted  on 07/07/19 with large empyema with compressive atelectasis. She underwent left and right cath revealing mild PAH prior to being taken to OR the same day for major thoracotomy with drainage of empyema and application of wound VAC by Dr. Julien Girt. Post op had issues with volume overload as well as A fib with RVR and started on IV heparin. IV amiodarone added for rate control and required norepinephrine for BP support. She was started on Vancomycin  and Zosyn added on 2/16 as wound culture positive for streptococcus intermedius. She was taken back to OR on 2/14 for wound irrigation and debridement and ultimately underwent greater omental flap closure on 2/16 with placement of Prevena and right chest tube. She has had issues with N/V and KUB negative for ileus but calcified 10 mm gallstone noted in RUQ. Dr. Haroldine Laws following for management of cardiac issues--she has been weaned off IV amiodarone and transitioned to po diuretics. Left chest wall wound with drains in place with moderate serous drainage. She has completed course of antibiotics 2/20 and Eliquis resumed 2/24. Nausea has resolved and intake improving. Patient admitted to [facility] 07/24/19 and SLP evaluation completed 07/25/19 with results as follows;  Pt presents with oropharyngeal swallow function WFL; recommend that she continue gluten free regular texture diet with thin liquids, and meds may be administered whole with thins, depending on pt preference.  During evaluation, pt consumed regular texture breakfast solids, demonstrating efficient mastication and oral clearance and was Mod I for use of safe swallow precautions. She also consumed ice chips and thin liquid via straw and cup without overt s/sx aspiration. She reported globus sensation X1. Given chart review and dx hx GERD, reviewed esophageal precautions including optimal postioning, eating more frequent but smaller meals, following solids with liquids, avoiding foods that exacerbate GERD, etc. No further ST is indicated at this time.    Skilled Therapeutic Interventions          Clinical bedside swallow evaluation was completed and results were reviewed with pt (see above for details).    SLP Assessment  Patient does not need any further Speech Lanaguage Pathology Services    Recommendations  SLP Diet Recommendations: Age appropriate regular solids;Thin Liquid Administration via: Straw;Cup Medication Administration: Whole meds with  liquid Supervision: Patient able to self feed Compensations: Slow rate;Small sips/bites;Follow solids with liquid Postural Changes and/or Swallow Maneuvers: Seated upright 90 degrees;Upright 30-60 min after meal Oral Care Recommendations: Oral care BID Patient destination: Home Follow up Recommendations: None Equipment Recommended: None recommended by SLP         SLP Duration     n/a for ST          Pain Pain Assessment Pain Scale: 0-10 Pain Score: 0-No pain    SLP Evaluation Oral Motor Oral Motor/Sensory Function Overall Oral Motor/Sensory Function: Within functional limits      Bedside Swallowing Assessment General Date of Onset: 07/10/19 Previous Swallow Assessment: BSE 2/18 Diet Prior to this Study: Thin liquids;Regular Temperature Spikes Noted: N/A Respiratory Status: Supplemental O2 delivered via (comment)([place]) History of Recent Intubation: Yes Length of Intubations (days): 2 days Date extubated: 07/14/19 Behavior/Cognition: Alert;Cooperative Oral Cavity - Dentition: Adequate natural dentition Self-Feeding Abilities: Able to feed self Vision: Functional for self-feeding Patient Positioning: Upright in bed Baseline Vocal Quality: Normal Volitional Cough: Strong Volitional Swallow: Able to elicit  Oral Care Assessment Does patient have any of the following "high(er) risk" factors?: None of the above Does patient have any of the following "at risk" factors?: Oxygen therapy -  cannula, mask, simple oxygen devices Patient is AT RISK: Order set for Adult Oral Care Protocol initiated -  "At Risk Patients" option selected (see row information) Patient is LOW RISK: Follow universal precautions (see row information) Ice Chips Ice chips: Within functional limits Thin Liquid Thin Liquid: Within functional limits Presentation: Cup;Straw;Self Fed Nectar Thick Nectar Thick Liquid: Not tested Honey Thick Honey Thick Liquid: Not tested Puree Puree: Not  tested Solid Solid: Within functional limits Presentation: Self Fed BSE Assessment Suspected Esophageal Findings Suspected Esophageal Findings: Globus sensation Risk for Aspiration Impact on safety and function: Mild aspiration risk Other Related Risk Factors: History of GERD;Decreased respiratory status      Recommendations for other services: None   Discharge Criteria: Patient will be discharged from SLP if patient refuses treatment 3 consecutive times without medical reason, if treatment goals not met, if there is a change in medical status, if patient makes no progress towards goals or if patient is discharged from hospital.  The above assessment, treatment plan, treatment alternatives and goals were discussed and mutually agreed upon: by patient  Arbutus Leas 07/25/2019, 7:55 AM

## 2019-07-26 ENCOUNTER — Inpatient Hospital Stay (HOSPITAL_COMMUNITY): Payer: Medicare Other

## 2019-07-26 ENCOUNTER — Inpatient Hospital Stay (HOSPITAL_COMMUNITY): Payer: Medicare Other | Admitting: Speech Pathology

## 2019-07-26 ENCOUNTER — Inpatient Hospital Stay (HOSPITAL_COMMUNITY): Payer: Medicare Other | Admitting: Physical Therapy

## 2019-07-26 ENCOUNTER — Inpatient Hospital Stay (HOSPITAL_COMMUNITY): Payer: Medicare Other | Admitting: Occupational Therapy

## 2019-07-26 LAB — CBC
HCT: 32.2 % — ABNORMAL LOW (ref 36.0–46.0)
Hemoglobin: 10 g/dL — ABNORMAL LOW (ref 12.0–15.0)
MCH: 30.8 pg (ref 26.0–34.0)
MCHC: 31.1 g/dL (ref 30.0–36.0)
MCV: 99.1 fL (ref 80.0–100.0)
Platelets: 329 10*3/uL (ref 150–400)
RBC: 3.25 MIL/uL — ABNORMAL LOW (ref 3.87–5.11)
RDW: 21.9 % — ABNORMAL HIGH (ref 11.5–15.5)
WBC: 8.5 10*3/uL (ref 4.0–10.5)
nRBC: 0 % (ref 0.0–0.2)

## 2019-07-26 LAB — BASIC METABOLIC PANEL
Anion gap: 6 (ref 5–15)
BUN: 12 mg/dL (ref 8–23)
CO2: 32 mmol/L (ref 22–32)
Calcium: 8.7 mg/dL — ABNORMAL LOW (ref 8.9–10.3)
Chloride: 101 mmol/L (ref 98–111)
Creatinine, Ser: 0.54 mg/dL (ref 0.44–1.00)
GFR calc Af Amer: >60 mL/min (ref >60)
GFR calc non Af Amer: >60 mL/min (ref >60)
Glucose, Bld: 101 mg/dL — ABNORMAL HIGH (ref 70–99)
Potassium: 4.4 mmol/L (ref 3.5–5.1)
Sodium: 139 mmol/L (ref 135–145)

## 2019-07-26 NOTE — Progress Notes (Signed)
Social Work Assessment and Plan   Patient Details  Name: Connie Jarvis MRN: [id number] Date of Birth: [birth year]  Today's Date: 07/26/2019  Problem List:  Patient Active Problem List   Diagnosis Date Noted  . Debility 07/24/2019  . Physical debility 07/24/2019  . Malnutrition of moderate degree 07/19/2019  . Pressure injury of skin 07/15/2019  . Empyema ([place]) 07/07/2019  . Pulmonary hypertension ([place]) 07/07/2019  . COPD with hypoxia ([place]) 07/07/2019  . Atrial fibrillation, chronic ([place]) 07/07/2019  . GERD (gastroesophageal reflux disease) 07/07/2019  . Essential hypertension 07/07/2019   Past Medical History:  Past Medical History:  Diagnosis Date  . Allergy   . Anemia   . Anxiety   . Arthritis   . Cataract    pt. not sure which eye early stage  . Depression   . GERD (gastroesophageal reflux disease)   . Hypertension    Past Surgical History:  Past Surgical History:  Procedure Laterality Date  . APPLICATION OF WOUND VAC Left 07/08/2019   Procedure: APPLICATION OF WOUND VAC;  Surgeon: Wonda Olds, MD;  [facility];  Service: Thoracic;  Laterality: Left;  . APPLICATION OF WOUND VAC Left 07/11/2019   Procedure: WOUND VAC CHANGE AND WOUND IRRIGATION AND SHARP DEBRIDEMENT;  Surgeon: Wonda Olds, MD;  Location: [place];  Service: Thoracic;  Laterality: Left;  . APPLICATION OF WOUND VAC Left 07/13/2019   Procedure: APPLICATION OF WOUND VAC USING PREVENA DRESSING;  Surgeon: Wonda Olds, MD;  [facility];  Service: Thoracic;  Laterality: Left;  . CENTRAL LINE INSERTION Left 07/08/2019   Procedure: CENTRAL LINE INSERTION;  Surgeon: Jolaine Artist, MD;  Location: [place] [facility];  Service: Cardiovascular;  Laterality: Left;  . CHEST TUBE INSERTION Right 07/13/2019   Procedure: Chest Tube Insertion;  Surgeon: Wonda Olds, MD;  Location: [facility] OR;  Service: Thoracic;  Laterality: Right;  . COLONOSCOPY    . EMPYEMA DRAINAGE Left 07/08/2019   Procedure:  EMPYEMA DRAINAGE;  Surgeon: Wonda Olds, MD;  [facility];  Service: Thoracic;  Laterality: Left;  . GREATER OMENTAL FLAP CLOSURE Left 07/13/2019   Procedure: GREATER OMENTAL FLAP CLOSURE;  Surgeon: Wonda Olds, MD;  [facility];  Service: Thoracic;  Laterality: Left;  . MOUTH SURGERY    . RIGHT/LEFT HEART CATH AND CORONARY ANGIOGRAPHY N/A 07/08/2019   Procedure: RIGHT/LEFT HEART CATH AND CORONARY ANGIOGRAPHY;  Surgeon: Jolaine Artist, MD;  Location: [place] [facility];  Service: Cardiovascular;  Laterality: N/A;  . THORACOTOMY Left 07/08/2019   Procedure: THORACOTOMY MAJOR;  Surgeon: Wonda Olds, MD;  [facility];  Service: Thoracic;  Laterality: Left;  . THORACOTOMY Left 07/13/2019   Procedure: CLOSURE OF THORACOTOMY;  Surgeon: Wonda Olds, MD;  [facility];  Service: Thoracic;  Laterality: Left;  . TONSILLECTOMY    . TUBAL LIGATION    . VASCULAR ASSESSMENT WITH SPY ELITE Left 07/13/2019   Procedure: Vascular Assessment With Spy Elite;  Surgeon: Wonda Olds, MD;  Location: [facility] OR;  Service: Thoracic;  Laterality: Left;   Social History:  reports that she has never smoked. She has never used smokeless tobacco. She reports previous alcohol use. She reports that she does not use drugs.  Family / Support Systems Marital Status: Married How Long?: 61 1/2 years Patient Roles: Spouse, Parent Spouse/Significant Other: Eddie Dibbles (husband): [phone] Children: 3 adult sons. I son lives locally in [place]; 1 lives in [place] area, and the  other lives near [place] and checkins regularly [name]). Other Supports: Son [name] PRN Anticipated Caregiver: Pt primary caregiver will be her husband Ability/Limitations of Caregiver: None reported Caregiver Availability: 24/7 Family Dynamics: Pt lives with husband. Pt gets PRN support from their son [name] with errands, transportation to/from medical appointments.  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 Education: college grad Read: Yes Write: Yes Employment Status: Retired Date Retired/Disabled/Unemployed: Programme researcher, broadcasting/film/video Issues: Denies Guardian/Conservator: N/A   Abuse/Neglect Abuse/Neglect Assessment Can Be Completed: Yes Physical Abuse: Denies Verbal Abuse: Denies Sexual Abuse: Denies Exploitation of patient/patient's resources: Denies Self-Neglect: Denies  Emotional Status Pt's affect, behavior and adjustment status: Pt adjusting to situation well. Recent Psychosocial Issues: Denies any current issues. Psychiatric History: Pt reports she will have bouts of "blues" throughout the years, but has not required any medication and/or counseling. Substance Abuse History: Denies.  Patient / Family Perceptions, Expectations & Goals Pt/Family understanding of illness & functional limitations: Pt has a general understanding of medical condition Premorbid pt/family roles/activities: Pt had support from husband for transportation to/from med appts. Anticipated changes in roles/activities/participation: No changes Pt/family expectations/goals: Pt goal is to keep getting stronger.  Community Resources [employer] Scripts: None Premorbid Home Care/DME Agencies: None Transportation available at discharge: Pt husband to d/c home  Discharge Planning Living Arrangements: Spouse/significant other Support Systems: Spouse/significant other, Children Type of Residence: Private residence Insurance Resources: Chartered certified accountant Resources: Other (Comment) Financial Screen Referred: No Living Expenses: Own Money Management: Spouse Does the patient have any problems obtaining your medications?: No Social Work Anticipated Follow Up Needs: HH/OP  Clinical Impression SW met with pt in room to introduce self, explain role, and  discuss discharge process. Pt was o2 dependent prior to admission. States DME company is [facility] Patient. Also has rollator RW. Previous HHA in past was [facility]. Amenable to return to [facility] if needed at d/c.   SW confirms DME company for [facility] Patient/[place] location (p: [phone]).   Loralee Pacas, MSW, [place] Office: [phone] Cell: [phone] Fax: [phone] 07/26/2019, 3:29 PM

## 2019-07-26 NOTE — Progress Notes (Signed)
Physical Therapy Session Note  Patient Details  Name: Connie Jarvis MRN: [id number] Date of Birth: [birth year]  Today's Date: 07/26/2019 PT Individual Time: 0757-0907 PT Individual Time Calculation (min): 70 min   Short Term Goals: Week 1:  PT Short Term Goal 1 (Week 1): = LTGs  Skilled Therapeutic Interventions/Progress Updates:     Patient in bed on 2L/min upon PT arrival. Patient was very pleasant and was alert and agreeable to PT session. Patient denied pain during session. Dr. Dagoberto Ligas, MD, rounded at beginning of session as PT set up w/c and obtained clothes for the patient.  Discussed home set-up, POC, PT goals, and previous falls. Patient reports at least 1 fall in the bathroom in the past year without injury and reports that her husband does fall outside sometimes without injury. Educated patient on fall risk/prevention, home modifications to prevent falls, and activation of emergency services in the event of a fall. Also provided education about speaking to PCP about falls if/when they occur with or without injury.   Therapeutic Activity: Bed Mobility: Patient performed supine to sit with supervision with min use of bed rail in a with HOB slightly elevated to simulate wedge pillow at home. Provided verbal cues for not using bed rails to simulate home set up, and scooting forward one hip at a time for energy conservation. Patient sat EOB with supervision and donned pants, doffed a gown and shirt and donned a clean shirt with supervision. Pulled pants up in standing with CGA for balance and supervision for LB dressing, including tying draw string. PT donned B TED hose  Transfers: Patient performed sit to/from stand from the hospital bed, the w/c, and the Rollator and stand pivot bed>w/c with CGA-close supervision using the Rollator. Provided verbal cues for use of breaks on the rollator for safety, and performing forward weight shift to stand. PT managed O2 line with transfers.   Gait  Training:  Patient ambulated 105 feet and 45 feet using the Rollator with CGA and total A for O2 management. Ambulated with decreased gait speed, decreased step length and height, increased B hip and knee flexion in stance, forward trunk lean, and downward head gaze. Provided verbal cues for erect posture, paced breathing, and increased step height for safety. RPE 5-6/10 with mild SOB after first trial, SPO2 96% on 2L/min (difficult to obtain with pulse-ox initially due to patient's cold fingers). RPE 7/10 after second trial, SPO2 90% on 2L/min with mild SOB and increased work of breathing. Recovered <1 min in sitting after each trial.  Educated patient on energy conservation techniques and use of modified 10-point RPE scale to manage SOB with activity.   Patient in w/c in room at end of session with breaks locked, seat blet alarm set, and all needs within reach. Patient required increased time and rest breaks with all activity. She remained on 2L/min O2 throughout session, SPO2 >90% throughout, however, it is difficult to obtain pulse-ox readings on patient, so monitored mostly by patient symptoms during session.    Therapy Documentation Precautions:  Precautions Precautions: Fall Precaution Comments: LUQ bulb drains; chronic O2 via [place] (2L) Restrictions Weight Bearing Restrictions: Yes RUE Weight Bearing: Non weight bearing    Therapy/Group: Individual Therapy  Maevyn Riordan L Tristyn Pharris PT, DPT  07/26/2019, 12:26 PM

## 2019-07-26 NOTE — Progress Notes (Signed)
Occupational Therapy Session Note  Patient Details  Name: Connie Jarvis MRN: [id number] Date of Birth: 1940/07/26  Today's Date: 07/26/2019 OT Individual Time: 1305-1350 OT Individual Time Calculation (min): 45 min    Short Term Goals: Week 1:  OT Short Term Goal 1 (Week 1): STGs equal to LTGs set at supervision to modified independent overall  Skilled Therapeutic Interventions/Progress Updates:    Treatment session with focus on functional mobility in home environment, bathroom transfers, and increased independence with LB dressing.  Pt received upright in recliner agreeable to therapy session.  Obtained longer length of O2 tubing allowing pt to ambulate to toilet.  Pt ambulated to toilet with Rollator with CGA.  Pt completed transfer and toileting tasks with CGA.  Pt with one LOB when attempting to stand from toilet requiring min assist to correct.  Ambulated around room with Rollator to obtain items from various heights and transport items in room.  Provided pt with plastic piece to assist with donning TEDS, pt able to return demonstration with min cues for sequencing and setup.  Pt able to obtain figure 4 position with BLE when donning shoes.  Remained upright in recliner with seat belt alarm on and all needs in reach.  Therapy Documentation Precautions:  Precautions Precautions: Fall Precaution Comments: LUQ bulb drains; chronic O2 via [place] (2L) Restrictions Weight Bearing Restrictions: Yes RUE Weight Bearing: Non weight bearing General:   Vital Signs: Therapy Vitals Temp: 98.7 F (37.1 C) Pulse Rate: 70 Resp: 20 BP: 103/69 Patient Position (if appropriate): Sitting Oxygen Therapy SpO2: 100 % O2 Device: Nasal Cannula Pain:   Pt with no c/o pain  Therapy/Group: Individual Therapy  Simonne Come 07/26/2019, 3:20 PM

## 2019-07-26 NOTE — Progress Notes (Signed)
[place] PHYSICAL MEDICINE & REHABILITATION PROGRESS NOTE   Subjective/Complaints:  Pt reports LBM this AM; denies pain; sleeping OK; voiding well; comfortable on current 2L of O2.     ROS: pt denies SOB, CP, abd pain, N/V/C/D   Objective:   No results found. Recent Labs    07/24/19 0224 07/25/19 0348  WBC 8.2 7.2  HGB 8.6* 8.5*  HCT 27.4* 27.9*  PLT 310 315   Recent Labs    07/25/19 0348  NA 139  K 4.1  CL 100  CO2 33*  GLUCOSE 97  BUN 14  CREATININE 0.60  CALCIUM 8.3*    Intake/Output Summary (Last 24 hours) at 07/26/2019 0903 Last data filed at 07/26/2019 0804 Gross per 24 hour  Intake 850 ml  Output 55 ml  Net 795 ml     Physical Exam: Vital Signs Blood pressure (!) 111/50, pulse 68, temperature 98 F (36.7 C), resp. rate 16, height 5' (1.524 m), weight 64.4 kg, SpO2 100 %. Constitutional: No distress . Vital signs reviewed. Pt is sitting up in bed; appears comfortable, OT at bedside, NAD HEENT: EOMI, oral membranes moist Neck: supple Cardiovascular: RRR without M/R/G  Respiratory/Chest: CTA B/L- L chest incision/drain Slightly decreased at L base GI/Abdomen: (+)BS; soft, NT, ND Ext: no clubbing, cyanosis, or edema Musculoskeletal:  General:1+ LE edema to mid shins B/L Cervical back: Normal range of motion.  Neurological: She is alertand oriented to person, place, and time. Dysphonia noted--mild. More prominent when she fatigues. Otherwise CN intact. UE motor 4/5 proximal to distal. LE: 3/5 HF, KE and 2-3/5 ADF/PF. Sensed pain and light touch in both feet. Skin: She isnot diaphoretic. Noerythema. 2 areas on sacrum- appear Stage II- peeling skin also noted    Assessment/Plan: 1. Functional deficits secondary to debility which require 3+ hours per day of interdisciplinary therapy in a comprehensive inpatient rehab setting.  Physiatrist is providing close team supervision and 24 hour management of active medical problems listed  below.  Physiatrist and rehab team continue to assess barriers to discharge/monitor patient progress toward functional and medical goals  Care Tool:  Bathing    Body parts bathed by patient: Right arm, Left arm, Chest, Abdomen, Front perineal area, Buttocks, Right upper leg, Left upper leg, Right lower leg, Left lower leg, Face         Bathing assist Assist Level: Minimal Assistance - Patient > 75%     Upper Body Dressing/Undressing Upper body dressing   What is the patient wearing?: Pull over shirt    Upper body assist Assist Level: Supervision/Verbal cueing    Lower Body Dressing/Undressing Lower body dressing      What is the patient wearing?: Underwear/pull up, Pants     Lower body assist Assist for lower body dressing: Minimal Assistance - Patient > 75%     Toileting Toileting    Toileting assist Assist for toileting: Minimal Assistance - Patient > 75%     Transfers Chair/bed transfer  Transfers assist     Chair/bed transfer assist level: Minimal Assistance - Patient > 75%     Locomotion Ambulation   Ambulation assist   Ambulation activity did not occur: N/A  Assist level: Minimal Assistance - Patient > 75% Assistive device: Rollator Max distance: 50'   Walk 10 feet activity   Assist     Assist level: Minimal Assistance - Patient > 75% Assistive device: Rollator   Walk 50 feet activity   Assist    Assist level: Minimal Assistance -  Patient > 75% Assistive device: Rollator    Walk 150 feet activity   Assist Walk 150 feet activity did not occur: Safety/medical concerns(endurance)         Walk 10 feet on uneven surface  activity   Assist     Assist level: Minimal Assistance - Patient > 75% Assistive device: Rollator   Wheelchair     Assist Will patient use wheelchair at discharge?: Yes Type of Wheelchair: Manual Wheelchair activity did not occur: N/A  Wheelchair assist level: Dependent - Patient 0%       Wheelchair 50 feet with 2 turns activity    Assist        Assist Level: Dependent - Patient 0%   Wheelchair 150 feet activity     Assist      Assist Level: Dependent - Patient 0%   Blood pressure (!) 111/50, pulse 68, temperature 98 F (36.7 C), resp. rate 16, height 5' (1.524 m), weight 64.4 kg, SpO2 100 %.  Medical Problem List and Plan: 1.Functional and mobility deficitssecondary to debility after empyema, thoracotomy and associated deficts -patient maynot yetshower -ELOS/Goals: mod I with PT, OT, SLP---10-14 days  -beginning therapies today 2. Antithrombotics: -DVT/anticoagulation:Pharmaceutical:Other (comment)--Eliquis -antiplatelet therapy: N/A 3.OA/Pain Management:has been using Celebrex bid for years.Tylenol prn 4. Mood:LCSW to follow for evaluation and support. -antipsychotic agents: N/A 5. Neuropsych: This patientiscapable of making decisions on herown behalf. 6. Skin/Wound Care:Routine pressure relief measures -barrier cream to sacral wounds -JP sites clean and dry 7. Fluids/Electrolytes/Nutrition:Monitor I/O. Offer supplements between meals.  8. Empyema s/p drainage:  Colchicine added on 2/26 "to try to get tubes drier"  -both JP's drained about 180cc over last 24 hours  -continue per surgery team 9. PAF: In NSR on amiodarone. Eliquis on board since 2/24 10 Fluid overload: Monitor weights daily. Continue lasix. -heart failure team following 11. ABLA:  2/28: trending down 10.4-->9.7-->9.4-->8.9-->8.6 --> 8.5 2/28   -requested stool sample for OB  -mild blood loss from drains  Continue to follow serially  -fe++ supp 12. Severe protein calorie malnutrition:encourage PO, protein supps 13. COPD- oxygen dependentsince for the past year but did not start using it till Sept.Respiratiory status stable on Dulera and Singulair.   3/1- tolerating  2L Of O2- comfortable 14. Nausea/Vomiting:Has had stomach issues for year. N/V has resolved--continue Protonix--decreased to daily.Celebrex likely contributing to GI distress.    LOS: 2 days A FACE TO FACE EVALUATION WAS PERFORMED  Connie Jarvis 07/26/2019, 9:03 AM

## 2019-07-26 NOTE — Progress Notes (Signed)
Occupational Therapy Session Note  Patient Details  Name: Connie Jarvis MRN: [id number] Date of Birth: 09/21/1940  Today's Date: 07/26/2019 OT Individual Time: 1004-1100 OT Individual Time Calculation (min): 56 min    Short Term Goals: Week 1:  OT Short Term Goal 1 (Week 1): STGs equal to LTGs set at supervision to modified independent overall  Skilled Therapeutic Interventions/Progress Updates:    Patient seated in w/c, alert and ready for therapy session.  O2 2L via [place].  Reviewed home set up and DME options.  Practiced tub transfer utilizing tub bench with CGA, min a to get legs into tub, she is able to get them out with CS.  Patient states that she would like tub bench for use when she is cleared to shower as she was having trouble stepping over tub prior to admission.   Completed seated and standing conditioning/balance activities with brief rest breaks needed between tasks due to fatigue.  Returned to room, she remained seated in w/c, seat belt alarm set and call bell in reach.    Therapy Documentation Precautions:  Precautions Precautions: Fall Precaution Comments: LUQ bulb drains; chronic O2 via [place] (2L) Restrictions Weight Bearing Restrictions: Yes RUE Weight Bearing: Non weight bearing General:   Vital Signs: Therapy Vitals Temp: 98 F (36.7 C) Pulse Rate: 68 Resp: 16 BP: (!) 111/50 Patient Position (if appropriate): Lying Oxygen Therapy SpO2: 100 %   Other Treatments:     Therapy/Group: Individual Therapy  [name] 07/26/2019, 7:50 AM

## 2019-07-26 NOTE — Progress Notes (Signed)
Inpatient Rehabilitation  Patient information reviewed and entered into eRehab system by Evanell Redlich M. Yaileen Hofferber, M.A., CCC/SLP, PPS Coordinator.  Information including medical coding, functional ability and quality indicators will be reviewed and updated through discharge.    

## 2019-07-26 NOTE — Care Management (Signed)
[facility] Individual Statement of Services  Patient Name:  Connie Jarvis  Date:  07/26/2019  Welcome to the [facility].  Our goal is to provide you with an individualized program based on your diagnosis and situation, designed to meet your specific needs.  With this comprehensive rehabilitation program, you will be expected to participate in at least 3 hours of rehabilitation therapies Monday-Friday, with modified therapy programming on the weekends.  Your rehabilitation program will include the following services:  Physical Therapy (PT), Occupational Therapy (OT), Speech Therapy (ST), 24 hour per day rehabilitation nursing, Therapeutic Recreaction (TR), Neuropsychology, Case Management (Social Worker), Rehabilitation Medicine, Nutrition Services, Pharmacy Services and Other  Weekly team conferences will be held on Tuesday to discuss your progress.  Your Social Worker will talk with you frequently to get your input and to update you on team discussions.  Team conferences with you and your family in attendance may also be held.  Expected length of stay: 7-9 days   Overall anticipated outcome: Supervision to Independent  Depending on your progress and recovery, your program may change. Your Social Worker will coordinate services and will keep you informed of any changes. Your Social Worker's name and contact numbers are listed  below.  The following services may also be recommended but are not provided by the [facility] Choice will be made to provide these services after discharge if needed.  Arrangements include referral to agencies that provide these services.  Your insurance has been verified to be: Medicare A/B   Your primary doctor is:  Kennith Maes  Pertinent information will be shared with  your doctor and your insurance company.  Social Worker:  Loralee Pacas, LCSWA  Information discussed with and copy given to patient by: Rana Snare, 07/26/2019, 2:47 PM

## 2019-07-26 NOTE — Progress Notes (Signed)
Physical Therapy Session Note  Patient Details  Name: Connie Jarvis MRN: [id number] Date of Birth: 04-Jul-1940  Today's Date: 07/26/2019 PT Individual Time: 1505-1540 PT Individual Time Calculation (min): 35 min   Short Term Goals: Week 1:  PT Short Term Goal 1 (Week 1): = LTGs  Skilled Therapeutic Interventions/Progress Updates: Pt presented in recliner agreeable to therapy. Pt denies pain during session. Session focused on general conditioning with activities. Performed ambulatory transfer from recliner to w/c and transported to day room for energy conservation. Pt transferred to NuStep CGA and participated in L1 x 5 min with pt indicating 5/10 on modified Borg scale. Pt ambulated 84f and 565fwith seated rest with pt indicated increased exertion to 7/10 after ambulation. Discussed with pt monitoring energy levels and taking therapeutic breaks when needed.  Pt transported remaining distance and performed ambulatory transfer to recliner CGA. Pt left with belt alarm in place, call bell within reach and needs met.      Therapy Documentation Precautions:  Precautions Precautions: Fall Precaution Comments: LUQ bulb drains; chronic O2 via [place] (2L) Restrictions Weight Bearing Restrictions: Yes RUE Weight Bearing: Non weight bearing General:   Vital Signs: Therapy Vitals Temp: 98.7 F (37.1 C) Pulse Rate: 70 Resp: 20 BP: 103/69 Patient Position (if appropriate): Sitting Oxygen Therapy SpO2: 100 % O2 Device: Nasal Cannula Pain:   Mobility:   Locomotion :    Trunk/Postural Assessment :    Balance:   Exercises:   Other Treatments:      Therapy/Group: Individual Therapy  Deztiny Sarra  Juleen Sorrels, PTA  07/26/2019, 4:25 PM

## 2019-07-27 ENCOUNTER — Inpatient Hospital Stay (HOSPITAL_COMMUNITY): Payer: Medicare Other

## 2019-07-27 ENCOUNTER — Inpatient Hospital Stay (HOSPITAL_COMMUNITY): Payer: Medicare Other | Admitting: Occupational Therapy

## 2019-07-27 NOTE — Progress Notes (Addendum)
Physical Therapy Session Note  Patient Details  Name: Connie Jarvis MRN: [id number] Date of Birth: 1941/02/01  Today's Date: 07/27/2019 PT Individual Time: 0800-0900 and 1300-1315 PT Individual Time Calculation (min): 60 min and 15 min   Short Term Goals: Week 1:  PT Short Term Goal 1 (Week 1): = LTGs  Skilled Therapeutic Interventions/Progress Updates:     Session 1: Patient in bed on 2L/min O2 upon PT arrival. Patient alert and agreeable to PT session. Patient denied pain during session.  Therapeutic Activity: Bed Mobility: Patient performed supine to sit with supervision in a flat bed without use of bed rails. She performed sit to supine with min A for LE management and supine to sit with supervision in the ADL bed. Assist due to elevated bed height. Discussed option of using a step stood to scoot back further onto the bed before lifting her LEs. Patient reports that she has a wooden step stool at home.  Transfers: Patient performed sit to/from stand x5 with CGA-close supervision using a Rollator. Provided verbal cues for locking breaks x2 and pushing up from a surface rather than pulling up on the handles for safety. Self care: Patient donned pants, doffed a gown, and donned a t-shirt and flannel shirt (unbuttoned) with supervision-mod I sitting EOB. Pulled up pants in standing with supervision for safety. PT donned B TED hose with total A for time management and energy conservation. Patient donned B shoes independently sitting EOB.   Gait Training:  Patient ambulated 12 feet x2 performing side stepping along the ADL bed to simulate home set-up with CGA-close supervision. Patient reports that there is not enough room for her Rollator by the bed. Provided verbal cues for taking small steps and leaning over the bed for support to maintain her balance with this activity.  Patient in recliner in room at end of session with breaks locked, seat belt alarm set, and all needs within reach. Patient  remained on 2L/min O2 throughout session, SPO2 >90% throughout. She continues to require increased time and rest breaks with all activity during session.   Session 2: Patient in bed with her husband in the room upon PT arrival. Patient alert and agreeable to PT session. Patient denied pain during session. Discussed d/c planning, follow-up therapies, ELOS, PT goals, equipment needs, and scheduled family education for Thursday from 1-3pm, CSW made aware, with patient and her husband. Educated on O2 line management at home for safety and fall prevention. Patient and her husband stated that she was doing well with this PTA. Pulmonary, PA came in to assess patient's drains during session while PT scheduled family education with the patient's husband. Transport then arrived to take patient to x-ray. Patient missed 15 min of skilled PT due to x-ray. Patient in bed with her husband and transport staff in the room at end of session.    Therapy Documentation Precautions:  Precautions Precautions: Fall Precaution Comments: LUQ bulb drains; chronic O2 via [place] (2L) Restrictions Weight Bearing Restrictions: No RUE Weight Bearing: Non weight bearing General: PT Amount of Missed Time (min): 15 Minutes PT Missed Treatment Reason: Xray    Therapy/Group: Individual Therapy  Larue Drawdy L Sapna Padron PT, DPT  07/27/2019, 3:58 PM

## 2019-07-27 NOTE — Progress Notes (Addendum)
Reesa Chew PA-C asked me to look at patient's left sided drains as were pinned to her paints which got pulled on while using the restroom. I carefully looked at dressings. There is slight erythema around small drain as well as larger drain. There is minor sloughy tissue around larger drain that is exposed as well. There is no evidence of infection at  this time and staples are intact.  I will order every other abdominal staple removed today. Midline incision is healing without signs of infection as well. PA/LAT CXR ordered and I will review once taken.

## 2019-07-27 NOTE — Progress Notes (Signed)
Physical Therapy Session Note  Patient Details  Name: Connie Jarvis MRN: [id number] Date of Birth: 1940/12/27  Today's Date: 07/27/2019 PT Individual Time: 1115-1159 PT Individual Time Calculation (min): 44 min   Short Term Goals: Week 1:  PT Short Term Goal 1 (Week 1): = LTGs  Skilled Therapeutic Interventions/Progress Updates:   Received pt sitting in recliner, pt agreeable to therapy, and denied any pain during session. Pt on 2L O2 via [place] throughout session. Session focused on functional mobility/transfers, dynamic standing balance/coordination, LE strength, and improved activity tolerance. Pt performed stand<>pivot recliner<>WC without AD CGA. Pt transported to gym in [facility] for time management purposes. Pt transferred stand<>pivot without AD CGA x 3 additional trials throughout session. pt transferred sit<>stand x3 reps on Airex without UE support CGA/min A and able to maintain static standing balance for 1 minute each trial. pt performed standing toe taps to 3 cones with handheld support min/mod A 2 x 3 reps bilaterally. O2 sat 97% increasing to 99% with seated rest break. Pt required multiple rest breaks throughout session due to increased fatigue. Pt transported back to room total assist and stand<>pivot WC<>recliner without AD CGA. Concluded session with pt sitting in recliner, needs within reach, and seatbelt alarm on.   Therapy Documentation Precautions:  Precautions Precautions: Fall Precaution Comments: LUQ bulb drains; chronic O2 via [place] (2L) Restrictions Weight Bearing Restrictions: No RUE Weight Bearing: Non weight bearing  Therapy/Group: Individual Therapy Alfonse Alpers PT, DPT   07/27/2019, 7:38 AM

## 2019-07-27 NOTE — Progress Notes (Signed)
Notified that drains got pulled during therapy--were pinned to pants and got pulled by toileting. Dressing changed--minimal serous drainage from insertion site. Drains seems stable with sutures intact and minimal tenderness to palpation. Incision is C/D/I with staples and sutures in place. Still draining. One of the JP's has pinkish tinged drainage. Midline incision with dime size blood drainage at proximal aspect. Will order CXR to check placement.

## 2019-07-27 NOTE — IPOC Note (Signed)
Overall Plan of Care [facility]) Patient Details Name: Connie Jarvis MRN: [id number] DOB: 13-Oct-1940  Admitting Diagnosis: Debility  Hospital Problems: Principal Problem:   Debility Active Problems:   Empyema ([place])   Pulmonary hypertension ([place])   COPD with hypoxia ([place])   Atrial fibrillation, chronic ([facility])   Malnutrition of moderate degree   Physical debility     Functional Problem List: Nursing Endurance, Medication Management, Skin Integrity  PT Balance, Edema, Endurance, Motor, Pain, Skin Integrity  OT Balance, Endurance  SLP    TR         Basic ADL's: OT Grooming, Bathing, Dressing, Toileting     Advanced  ADL's: OT Simple Meal Preparation     Transfers: PT Bed to Chair, Bed Mobility, Car, Manufacturing systems engineer, Metallurgist: PT Ambulation, Stairs     Additional Impairments: OT None  SLP        TR      Anticipated Outcomes Item Anticipated Outcome  Self Feeding independent  Swallowing      Basic self-care  supervision to modified independent  Toileting  modified independent   Bathroom Transfers supervision  Bowel/Bladder  remain continent of bowel and bladder  Transfers  mod I basic household; supervision car  Locomotion  mod I household distances; supervision longer due to O2 management; min assist stairs  Communication     Cognition     Pain  no pain or less than 2  Safety/Judgment  remain free of falls, further skin breakdown and infection   Therapy Plan: PT Intensity: Minimum of 1-2 x/day ,45 to 90 minutes PT Frequency: 5 out of 7 days PT Duration Estimated Length of Stay: 7-9 days OT Intensity: Minimum of 1-2 x/day, 45 to 90 minutes OT Frequency: 5 out of 7 days OT Duration/Estimated Length of Stay: 7-9 days SLP Duration/Estimated Length of Stay: n/a for ST   Due to the current state of emergency, patients may not be receiving their 3-hours of Medicare-mandated therapy.   Team Interventions: Nursing Interventions  Patient/Family Education, Skin Care/Wound Management, Medication Management, Discharge Planning, Psychosocial Support  PT interventions Ambulation/gait training, Training and development officer, Community reintegration, Discharge planning, Disease management/prevention, DME/adaptive equipment instruction, Functional mobility training, Neuromuscular re-education, Pain management, Patient/family education, Psychosocial support, Skin care/wound management, Splinting/orthotics, Stair training, Therapeutic Activities, Therapeutic Exercise, UE/LE Strength taining/ROM, Wheelchair propulsion/positioning, UE/LE Coordination activities  OT Interventions Balance/vestibular training, Discharge planning, Self Care/advanced ADL retraining, Pain management, Therapeutic Activities, Cognitive remediation/compensation, Functional mobility training, Patient/family education, Therapeutic Exercise, UE/LE Coordination activities, Community reintegration, Engineer, drilling, UE/LE Strength taining/ROM, Neuromuscular re-education, Wheelchair propulsion/positioning  SLP Interventions    TR Interventions    SW/CM Interventions Discharge Planning, Psychosocial Support, Patient/Family Education   Barriers to Discharge MD  Medical stability, Home enviroment access/loayout, Wound care, Lack of/limited family support and JP drains and wounds  Nursing      PT Decreased caregiver support Patient's husband with history of falls and only support at d/c, patient will need to be mod I at home and able to manage O2  OT      SLP      SW       Team Discharge Planning: Destination: PT-Home ,OT- Home , SLP-Home Projected Follow-up: PT-Home health PT, OT-  Home health OT, 24 hour supervision/assistance, SLP-None Projected Equipment Needs: PT-Wheelchair (measurements), Wheelchair cushion (measurements)(for community mobility), OT- To be determined, SLP-None recommended by SLP Equipment Details: PT-pt has rollator at home,  OT-  Patient/family involved in discharge planning:  PT- Patient, Family member/caregiver,  OT-Patient, SLP-Patient  MD ELOS: 7-9 days Medical Rehab Prognosis:  Good Assessment: Pt is a 79 yr old female with  O2 dependent COPD admitted with L empyema- underwent thoracotomy for drainage of empyema and wound VAC which is now off- has 2 JP draings- also had A fib with RVR- eliquis resumed for A fib and pt has overall debility- s/p IV ABX.   Pt needs to work on endurance, strengthening, gait, transfers and ADLs to be able to go home- goals Supervision to [place].      See Team Conference Notes for weekly updates to the plan of care

## 2019-07-27 NOTE — Progress Notes (Signed)
Occupational Therapy Session Note  Patient Details  Name: Connie Jarvis MRN: [id number] Date of Birth: 1940-07-23  Today's Date: 07/27/2019 OT Individual Time: 1305-1400 OT Individual Time Calculation (min): 55 min    Short Term Goals: Week 1:  OT Short Term Goal 1 (Week 1): STGs equal to LTGs set at supervision to modified independent overall  Skilled Therapeutic Interventions/Progress Updates:    1:1 Participated in self care retraining tasks. Pt received in the recliner. Pt ambulated to and from the bathroom with Rollator with contact guard with extra time for sit to stands. When coming up off the toilet- drain slightly pulled dislodging bulb from tubing. RN came to address and PA called to assess drain site. Recommended her drains always being fastened to her shirt not her pants. Pt participated in bathing at bed side sit to stands. Focus on standing balance with one UE support to no UE support with contact guard. Pt does require rest break with prolonged standing activities. Pt performed all tasks on 2 liters of O2. Assisted with washing pt's hair while in seated position. Pt left in bed in prep for PA to come and assess her drain sites.   Husband did arrived towards the end of session and continued to discuss d/c home plans and recommendations.   Therapy Documentation Precautions:  Precautions Precautions: Fall Precaution Comments: LUQ bulb drains; chronic O2 via [place] (2L) Restrictions Weight Bearing Restrictions: No RUE Weight Bearing: Non weight bearing General:   Vital Signs: Therapy Vitals Temp: 98.2 F (36.8 C) Pulse Rate: 72 Resp: 17 BP: 109/84 Patient Position (if appropriate): Lying Oxygen Therapy SpO2: 100 % O2 Device: Nasal Cannula Pain:  no c/o pain in session   Therapy/Group: Individual Therapy  Willeen Cass [facility] 07/27/2019, 3:28 PM

## 2019-07-27 NOTE — Progress Notes (Signed)
[facility] PHYSICAL MEDICINE & REHABILITATION PROGRESS NOTE   Subjective/Complaints:  Pt reports knees and legs very sore this AM/last night due to working out with PT- mainly due to the walking- walked "a lot".  Also had mild HA overnight- improved this AM.   Also notes a little yellow phlegm this AM- doesn't feel like getting a cold/sick.   LBM this AM. Was on home O2 and 2L at home.    ROS: pt denies SOB, CP, abd pain, N/V/C/D   Objective:   No results found. Recent Labs    07/25/19 0348 07/26/19 0907  WBC 7.2 8.5  HGB 8.5* 10.0*  HCT 27.9* 32.2*  PLT 315 329   Recent Labs    07/25/19 0348 07/26/19 0907  NA 139 139  K 4.1 4.4  CL 100 101  CO2 33* 32  GLUCOSE 97 101*  BUN 14 12  CREATININE 0.60 0.54  CALCIUM 8.3* 8.7*    Intake/Output Summary (Last 24 hours) at 07/27/2019 0856 Last data filed at 07/27/2019 0000 Gross per 24 hour  Intake 310 ml  Output 70 ml  Net 240 ml     Physical Exam: Vital Signs Blood pressure (!) 105/48, pulse 64, temperature 98.4 F (36.9 C), temperature source Oral, resp. rate 20, height 5' (1.524 m), weight 60 kg, SpO2 100 %. Constitutional: No distress . Vital signs reviewed. Pt sitting up in bed; went to bathroom with rollator and CNA- just back; NAD HEENT: EOMI, oral membranes moist; wearing O2 by [place] 2L Neck: supple Cardiovascular: RRR; no JVD noted Respiratory/Chest: CTA B/L- L chest incision with staples in tactand 2 JP drains; still slightly decreased on L side GI/Abdomen: soft, NT, ND, (+)BS- abd incision with staples and still some active seepage of bloody drainage.  Ext: no clubbing, cyanosis, or edema Musculoskeletal:  General:1+ LE edema to mid shins B/L Cervical back: Normal range of motion.  Neurological: She is alertand oriented to person, place, and time. Dysphonia noted--mild. More prominent when she fatigues. Otherwise CN intact. UE motor 4/5 proximal to distal. LE: 3/5 HF, KE and 2-3/5 ADF/PF. Sensed  pain and light touch in both feet. Skin: She isnot diaphoretic. Noerythema. 2 areas on sacrum- appear Stage II- peeling skin also noted    Assessment/Plan: 1. Functional deficits secondary to debility which require 3+ hours per day of interdisciplinary therapy in a comprehensive inpatient rehab setting.  Physiatrist is providing close team supervision and 24 hour management of active medical problems listed below.  Physiatrist and rehab team continue to assess barriers to discharge/monitor patient progress toward functional and medical goals  Care Tool:  Bathing    Body parts bathed by patient: Right arm, Left arm, Chest, Abdomen, Front perineal area, Buttocks, Right upper leg, Left upper leg, Right lower leg, Left lower leg, Face         Bathing assist Assist Level: Minimal Assistance - Patient > 75%     Upper Body Dressing/Undressing Upper body dressing   What is the patient wearing?: Pull over shirt    Upper body assist Assist Level: Supervision/Verbal cueing    Lower Body Dressing/Undressing Lower body dressing      What is the patient wearing?: Underwear/pull up, Pants     Lower body assist Assist for lower body dressing: Minimal Assistance - Patient > 75%     Toileting Toileting    Toileting assist Assist for toileting: Contact Guard/Touching assist     Transfers Chair/bed transfer  Transfers assist  Chair/bed transfer assist level: Contact Guard/Touching assist Chair/bed transfer assistive device: Other(rollator)   Locomotion Ambulation   Ambulation assist   Ambulation activity did not occur: N/A  Assist level: Contact Guard/Touching assist Assistive device: Walker-rolling Max distance: 105'   Walk 10 feet activity   Assist     Assist level: Contact Guard/Touching assist Assistive device: Walker-rolling   Walk 50 feet activity   Assist    Assist level: Contact Guard/Touching assist Assistive device: Walker-rolling     Walk 150 feet activity   Assist Walk 150 feet activity did not occur: Safety/medical concerns(endurance)         Walk 10 feet on uneven surface  activity   Assist     Assist level: Minimal Assistance - Patient > 75% Assistive device: Rollator   Wheelchair     Assist Will patient use wheelchair at discharge?: Yes Type of Wheelchair: Manual Wheelchair activity did not occur: N/A  Wheelchair assist level: Dependent - Patient 0%      Wheelchair 50 feet with 2 turns activity    Assist        Assist Level: Dependent - Patient 0%   Wheelchair 150 feet activity     Assist      Assist Level: Dependent - Patient 0%   Blood pressure (!) 105/48, pulse 64, temperature 98.4 F (36.9 C), temperature source Oral, resp. rate 20, height 5' (1.524 m), weight 60 kg, SpO2 100 %.  Medical Problem List and Plan: 1.Functional and mobility deficitssecondary to debility after empyema, thoracotomy and associated deficts -patient maynot yetshower due to JP drains -ELOS/Goals: mod I with PT, OT, SLP---10-14 days  -beginning therapies today 2. Antithrombotics: -DVT/anticoagulation:Pharmaceutical:Other (comment)--Eliquis -antiplatelet therapy: N/A 3.OA/Pain Management:has been using Celebrex bid for years.Tylenol prn  3/2- having a lot of pain in knees and LEs in general- not asking for additional meds, but might give tramadol prn.  4. Mood:LCSW to follow for evaluation and support. -antipsychotic agents: N/A 5. Neuropsych: This patientiscapable of making decisions on herown behalf. 6. Skin/Wound Care:Routine pressure relief measures -barrier cream to sacral wounds -JP sites clean and dry 7. Fluids/Electrolytes/Nutrition:Monitor I/O. Offer supplements between meals.  8. Empyema s/p drainage:  Colchicine added on 2/26 "to try to get tubes drier"  -both JP's drained about 180cc  over last 24 hours  3/2- JPs are >1/2 full for both of them- will con't JP drains per surgery.   -continue per surgery team 9. PAF: In NSR on amiodarone. Eliquis on board since 2/24 10 Fluid overload: Monitor weights daily. Continue lasix. -heart failure team following   Filed Weights   07/25/19 0500 07/26/19 0500 07/27/19 0500  Weight: 60.5 kg 64.4 kg 60 kg    3/2- weight stable at 60 kg 11. ABLA:  2/28: trending down 10.4-->9.7-->9.4-->8.9-->8.6 --> 8.5 2/28   -requested stool sample for OB  -mild blood loss from drains  Continue to follow serially  -fe++ supp 12. Severe protein calorie malnutrition:encourage PO, protein supps 13. COPD- oxygen dependent- acute on chronic hypoxic resp failure- since for the past year but did not start using it till Sept.Respiratiory status stable on Dulera and Singulair.   3/1- tolerating 2L Of O2- comfortable  3/2- on home dose of O2- had a little phlegm this AM, but no Sx's of illness otherwise- will con't to monitor closely.  14. Nausea/Vomiting:Has had stomach issues for year. N/V has resolved--continue Protonix--decreased to daily.Celebrex likely contributing to GI distress.    LOS: 3 days A FACE TO FACE EVALUATION  WAS PERFORMED  Connie Jarvis 07/27/2019, 8:56 AM

## 2019-07-27 NOTE — Patient Care Conference (Signed)
Inpatient RehabilitationTeam Conference and Plan of Care Update Date: 07/27/2019   Time: 11:35 AM    Patient Name: Connie Jarvis      Medical Record Number: [id number]  Date of Birth: 1941/03/25 Sex: Female         Room/Bed: 4M06C/4M06C-02 Payor Info: Payor: MEDICARE / Plan: MEDICARE PART A AND B / Product Type: *No Product type* /    Admit Date/Time:  07/24/2019  3:49 PM  Primary Diagnosis:  Debility  Patient Active Problem List   Diagnosis Date Noted  . Debility 07/24/2019  . Physical debility 07/24/2019  . Malnutrition of moderate degree 07/19/2019  . Pressure injury of skin 07/15/2019  . Empyema ([place]) 07/07/2019  . Pulmonary hypertension ([place]) 07/07/2019  . COPD with hypoxia ([place]) 07/07/2019  . Atrial fibrillation, chronic ([place]) 07/07/2019  . GERD (gastroesophageal reflux disease) 07/07/2019  . Essential hypertension 07/07/2019    Expected Discharge Date: Expected Discharge Date: 08/03/19  Team Members Present: Physician leading conference: Dr. Courtney Heys Care Coodinator Present: Loralee Pacas, LCSWA;Genie Jerrid Forgette, RN, MSN Nurse Present: Other (comment)(Amanda Archie, RN) PT Present: Apolinar Junes, PT OT Present: Elisabeth Most, OT SLP Present: Weston Anna, SLP PPS Coordinator present : Ileana Ladd, Burna Mortimer, SLP     Current Status/Progress Goal Weekly Team Focus  Bowel/Bladder   Patient is continent of B & B  Maintain continence of B & B  Monitor continence every shift/ PRN   Swallow/Nutrition/ Hydration             ADL's   UB adl set up, LB adl min A, functional transfers CGA  CS  general conditioning, adl training, functional mobility and transfer training, family education   Mobility   Supervision bed mobiity, CGA transfers and gait 105 ft with RW, mod A 2 steps with B rails  Mod I bed mobility, transfers, and household gait 30 ft, supervision community mobility 100 ft, min A 4 steps with rails  Balance, activity tolerance, strengthening,  functional mobility, gait and stair training, and patient/caregiver education   Communication             Safety/Cognition/ Behavioral Observations            Pain   Patient denies pain  Free from pain  Assess pain every shift/PRN   Skin   Patient has stage 2 to buttocks, incison to left side of chest/rib cage with staples and 2 JP drains, incision to upper abdomen with staples  Continue with Gerhartts butt cream, Maintain dressings to left ribs and abdomen to be C/D/I, Prevent further skin breakdown  Assess skin every shift/PRN    Rehab Goals Patient on target to meet rehab goals: Yes *See Care Plan and progress notes for long and short-term goals.     Barriers to Discharge  Current Status/Progress Possible Resolutions Date Resolved   Nursing                  PT  Decreased caregiver support  Patient's husband with history of falls and only support at d/c, patient will need to be mod I at home and able to manage O2              OT                  SLP                SW  Discharge Planning/Teaching Needs:  Pt to discharge to home with support from her husband and her son [name] who will assist PRN  Family education as recommended by therapy   Team Discussion: Empyema, post thoracotomy, AF with RVR, on eloquis, using celebrex for years, JP drains, O2 dependent COPD on 2L at home, Hgb down, labs ordered.  RN SOB with activity, midline, bottom stage 2, getting up to the bathroom.  OT CGA/min transfers and LB ADLs, S UB ADLs.  PT bed s/min, transfers CGA/S rollator, cues for locking brakes, amb 50' CGA, stairs mod A, has ramp, mod I goals in the house, S goals for community.   Revisions to Treatment Plan: N/A     Medical Summary Current Status: continent of bowel and bladder; on O2 2L home dose; no pain issues per nursing; emptied JP drains 2x this AM 5am and 9am; midline; flushing well; Stage II on bottom- cream Weekly Focus/Goal: OT_ CGA-min assist; SBA UB;  practiced shower bench- doing great  Barriers to Discharge: Decreased family/caregiver support;Home enviroment access/layout;Weight;Wound care  Barriers to Discharge Comments: s/p thoracotomy Possible Resolutions to Barriers: PT- bed Supervison-min assist; transfersCGA-supervison with rollator- needs cues for brakes; 50-100 ft rollator- knee sore with higher distance; stairs mod assist- no stairs at home;   Continued Need for Acute Rehabilitation Level of Care: The patient requires daily medical management by a physician with specialized training in physical medicine and rehabilitation for the following reasons: Direction of a multidisciplinary physical rehabilitation program to maximize functional independence : Yes Medical management of patient stability for increased activity during participation in an intensive rehabilitation regime.: Yes Analysis of laboratory values and/or radiology reports with any subsequent need for medication adjustment and/or medical intervention. : Yes   I attest that I was present, lead the team conference, and concur with the assessment and plan of the team.   Retta Diones 07/27/2019, 6:46 PM   Team conference was held via web/ teleconference due to [place] - 19

## 2019-07-28 ENCOUNTER — Inpatient Hospital Stay (HOSPITAL_COMMUNITY): Payer: Medicare Other | Admitting: Occupational Therapy

## 2019-07-28 ENCOUNTER — Inpatient Hospital Stay (HOSPITAL_COMMUNITY): Payer: Medicare Other

## 2019-07-28 LAB — CBC WITH DIFFERENTIAL/PLATELET
Abs Immature Granulocytes: 0.02 10*3/uL (ref 0.00–0.07)
Basophils Absolute: 0 10*3/uL (ref 0.0–0.1)
Basophils Relative: 0 %
Eosinophils Absolute: 0.3 10*3/uL (ref 0.0–0.5)
Eosinophils Relative: 5 %
HCT: 29.4 % — ABNORMAL LOW (ref 36.0–46.0)
Hemoglobin: 9.1 g/dL — ABNORMAL LOW (ref 12.0–15.0)
Immature Granulocytes: 0 %
Lymphocytes Relative: 9 %
Lymphs Abs: 0.6 10*3/uL — ABNORMAL LOW (ref 0.7–4.0)
MCH: 30.5 pg (ref 26.0–34.0)
MCHC: 31 g/dL (ref 30.0–36.0)
MCV: 98.7 fL (ref 80.0–100.0)
Monocytes Absolute: 0.8 10*3/uL (ref 0.1–1.0)
Monocytes Relative: 12 %
Neutro Abs: 5 10*3/uL (ref 1.7–7.7)
Neutrophils Relative %: 74 %
Platelets: 270 10*3/uL (ref 150–400)
RBC: 2.98 MIL/uL — ABNORMAL LOW (ref 3.87–5.11)
RDW: 21.7 % — ABNORMAL HIGH (ref 11.5–15.5)
WBC: 6.7 10*3/uL (ref 4.0–10.5)
nRBC: 0 % (ref 0.0–0.2)

## 2019-07-28 NOTE — Progress Notes (Signed)
Physical Therapy Session Note  Patient Details  Name: Connie Jarvis MRN: [id number] Date of Birth: Dec 15, 1940  Today's Date: 07/28/2019 PT Individual Time: 1335-1445 PT Individual Time Calculation (min): 70 min   Short Term Goals: Week 1:  PT Short Term Goal 1 (Week 1): = LTGs  Skilled Therapeutic Interventions/Progress Updates:     Patient ambulating from the bathroom with nursing students on 2L/min O2 upon PT arrival. Patient alert and agreeable to PT session. Patient reported 5-6/10 L>R knee pain with standing activities during session, RN made aware. PT provided repositioning, rest breaks, and distraction as pain interventions throughout session.   Therapeutic Activity: Transfers: Patient performed sit to/from stand or stand pivot transfers x7 with supervision using a Rollator. Provided verbal cues for use of breaks and turning toward her O2 line for safety.  Gait Training:  Patient ambulated 40-50 feet x3 and up/down a ramp using a rollator with close supervision for safety. Ambulated with decreased gait speed, decreased step length and height, increased B hip and knee flexion in stance, forward trunk lean, and downward head gaze. Provided verbal cues for erect posture, paced breathing, .  Therapeutic Exercise: Patient performed the following exercises with verbal and tactile cues for proper technique. -diaphragmatic breathing with manual resistance x1 min -diaphragmatic breathing with gentle AROM lift/chops times with breathing for improved trunk and chest mobility with breathing 2x10 -B LAQ 2x10 during seated rest breaks  Educated patient on energy conservation techniques, paced breathing during activities, muscle associated with breathing during session. Also dicussed pain management for her knees during session, RN made aware to ask patient if she would like Tylenol for her knees this evening to manage pain, also made note in patient's notebook to speak to Dr. Dagoberto Ligas about her knee  pain in the morning.   Patient continues to have mild SOB with minimal activity and requires frequent seated rest breaks, SPO2 >95% on 2L/min throughout session.   Patient in recliner in room at end of session with breaks locked, chair alarm set, and all needs within reach.    Therapy Documentation Precautions:  Precautions Precautions: Fall Precaution Comments: LUQ bulb drains; chronic O2 via [place] (2L) Restrictions Weight Bearing Restrictions: No RUE Weight Bearing: Non weight bearing    Therapy/Group: Individual Therapy  Aslin Farinas L Amiree No PT, DPT  07/28/2019, 5:06 PM

## 2019-07-28 NOTE — Progress Notes (Signed)
Physical Therapy Session Note  Patient Details  Name: Connie Jarvis MRN: [id number] Date of Birth: 06/06/1940  Today's Date: 07/28/2019 PT Individual Time: 0915-1008 PT Individual Time Calculation (min): 53 min   Short Term Goals: Week 1:  PT Short Term Goal 1 (Week 1): = LTGs Week 2:    Week 3:     Skilled Therapeutic Interventions/Progress Updates:    PAIN  Denies pain.  Pt initially supine and eager to participate in session.  Supine to sit on edge of bed w/supervision w/hob elevated and using bedrail.  Pt removed nightshirt w/assist only to manage drains.  Dons clean shirt w/supervision and assist w/drains/02.  Pt then donned underwear and pants to knees w/supervision.  STS w/ rollator w/cga and cues for safety/locking of brakes/hand placement. raises underwear and pants w/cga.  Gait 84ft, 29ft w/Rollator w/cues for safety, assist to manage 02, supervsion.  Pt turn to sit on rollator w/max cues for safety/locking brakes, turn to sit in wc w/supervision, cues for safety.  wc propulsion w/bilat LEs and min assist x 71ft for LE strengthening.  Pt transported to PT gym Performed step ups on 3 inch step using two rails 2x10 w/2-3 min recovery between efforts, leads L first set, leads R second set, easier and more stable leading w/L today. 02 sats 98-99%, HR 84-89.  STS from wc to rollator again w/significant cueing to ensure brakes of wc and rollator locked prior to transition.  Gait 75t w/rollator w/occasional cues to maintain safe distance within walker, cga.  Turns to sit on Rollator again w/cues for locking of brakes.  02sats 96%, hr 88.   SPT rollator to wc w/cues for safety and min assist for balance.  Pt transported back to room at end of session. Pt left oob in wc w/alarm belt set and needs in reach   Therapy Documentation Precautions:  Precautions Precautions: Fall Precaution Comments: LUQ bulb drains; chronic O2 via [place] (2L) Restrictions Weight Bearing Restrictions: No RUE  Weight Bearing: Non weight bearing    Therapy/Group: Individual Therapy  [name], PT   [name] 07/28/2019, 12:51 PM

## 2019-07-28 NOTE — Plan of Care (Signed)
  Problem: RH Stairs Goal: LTG Patient will ambulate up and down stairs w/assist (PT) Description: LTG: Patient will ambulate up and down # of stairs with assistance (PT) Outcome: Not Progressing Flowsheets (Taken 07/28/2019 1709) LTG: Pt will ambulate up/down stairs assist needed:: (d/c goal) -- Note: D/c stair goal due to patient with increased knee pain when performing stairs limiting progress. Patient does not have stairs at home and was not performing stairs PTA.

## 2019-07-28 NOTE — Discharge Instructions (Addendum)
Inpatient Rehab Discharge Instructions  Connie Jarvis Discharge date and time:  08/03/19  Activities/Precautions/ Functional Status: Activity: no lifting, driving, or strenuous exercise till cleared by MD Diet: cardiac diet Wound Care: Paint mid abdomen wound with betadine daily after bath. Cleanse around drain site with normal saline and pat dry. Contact Dr. Julien Girt if you develop any problems with your incision/wound--redness, swelling, increase in pain, drainage or if you develop fever or chills.    Functional status:  ___ No restrictions     ___ Walk up steps independently _X__ 24/7 supervision/assistance   ___ Walk up steps with assistance ___ Intermittent supervision/assistance  ___ Bathe/dress independently ___ Walk with walker     _X__ Bathe/dress with assistance ___ Walk Independently    ___ Shower independently ___ Walk with assistance    ___ Shower with assistance _X__ No alcohol     ___ Return to work/school ________   Special Instructions:  [place] to provide PT, OT, RN for follow up. Contact # [phone]  My questions have been answered and I understand these instructions. I will adhere to these goals and the provided educational materials after my discharge from the hospital.  Patient/Caregiver Signature _______________________________ Date __________  Clinician Signature _______________________________________ Date __________  Please bring this form and your medication list with you to all your follow-up doctor's appointments.    Information on my medicine - ELIQUIS (apixaban)  This medication education was reviewed with me or my healthcare representative as part of my discharge preparation.    Why was Eliquis prescribed for you? Eliquis was prescribed for you to reduce the risk of a blood clot forming that can cause a stroke if you have a medical condition called atrial fibrillation (a type of irregular heartbeat).  What do You need to know  about Eliquis ? Take your Eliquis TWICE DAILY - one tablet in the morning and one tablet in the evening with or without food. If you have difficulty swallowing the tablet whole please discuss with your pharmacist how to take the medication safely.  Take Eliquis exactly as prescribed by your doctor and DO NOT stop taking Eliquis without talking to the doctor who prescribed the medication.  Stopping may increase your risk of developing a stroke.  Refill your prescription before you run out.  After discharge, you should have regular check-up appointments with your healthcare provider that is prescribing your Eliquis.  In the future your dose may need to be changed if your kidney function or weight changes by a significant amount or as you get older.  What do you do if you miss a dose? If you miss a dose, take it as soon as you remember on the same day and resume taking twice daily.  Do not take more than one dose of ELIQUIS at the same time to make up a missed dose.  Important Safety Information A possible side effect of Eliquis is bleeding. You should call your healthcare provider right away if you experience any of the following: ? Bleeding from an injury or your nose that does not stop. ? Unusual colored urine (red or dark brown) or unusual colored stools (red or black). ? Unusual bruising for unknown reasons. ? A serious fall or if you hit your head (even if there is no bleeding).  Some medicines may interact with Eliquis and might increase your risk of bleeding or clotting while on Eliquis. To help avoid this, consult your healthcare provider or pharmacist prior to using any  new prescription or non-prescription medications, including herbals, vitamins, non-steroidal anti-inflammatory drugs (NSAIDs) and supplements.  This website has more information on Eliquis (apixaban): http://www.eliquis.com/eliquis/home

## 2019-07-28 NOTE — Progress Notes (Signed)
Social Work Patient ID: Connie Jarvis, female   DOB: 1941/05/10, 79 y.o.   MRN: [id number]    07/27/2019- SW met with pt in room to provide updates from rehab therapy team, and anticiapted d/c on 08/03/2019. SW to update once more information on d/c recommendations.   Loralee Pacas, MSW, [place] Office: [phone] Cell: [phone] Fax: [phone]

## 2019-07-28 NOTE — Progress Notes (Signed)
Occupational Therapy Session Note  Patient Details  Name: Connie Jarvis MRN: [id number] Date of Birth: 03-30-1941  Today's Date: 07/28/2019 OT Individual Time: [id number] OT Individual Time Calculation (min): 57 min    Short Term Goals: Week 1:  OT Short Term Goal 1 (Week 1): STGs equal to LTGs set at supervision to modified independent overall  Skilled Therapeutic Interventions/Progress Updates:    Upon entering the room, pt seated in wheelchair awaiting OT arrival with no c/o pain this session. Pt verbalized need for toileting and standing with supervision from wheelchair. Pt ambulating with rollator to bathroom with supervision and min guard for hygiene and clothing management. Pt standing at sink for hand hygiene with close supervision as well. Pt requesting to wash UB with set up A to obtain needed items. Pt able to don pull over and button up shirt with set up A as well. Drain is attached to shirt for safety. Pt required rest breaks secondary to fatigue. OT provided pt with paper handout of energy conservation techniques and began education. Pt also discussing HHOT recommendations and pt stating her goals as being able to plant flowers in her pots on deck and cook some in the kitchen. RN notified of drains being very full. Pt with no further concerns at this time. Call bell and all needed items within reach. Chair alarm belt donned.   Therapy Documentation Precautions:  Precautions Precautions: Fall Precaution Comments: LUQ bulb drains; chronic O2 via [place] (2L) Restrictions Weight Bearing Restrictions: No RUE Weight Bearing: Non weight bearing Pain: Pain Assessment Pain Scale: 0-10 Pain Score: 0-No pain ADL: ADL Eating: Independent Where Assessed-Eating: Wheelchair Grooming: Setup Where Assessed-Grooming: Wheelchair Upper Body Bathing: Setup Where Assessed-Upper Body Bathing: Wheelchair Lower Body Bathing: Minimal assistance Where Assessed-Lower Body Bathing: Wheelchair, Sitting  at sink, Standing at sink Upper Body Dressing: Setup Where Assessed-Upper Body Dressing: Wheelchair Lower Body Dressing: Minimal assistance Where Assessed-Lower Body Dressing: Wheelchair, Sitting at sink, Standing at sink Toileting: Minimal assistance Where Assessed-Toileting: Bedside Commode Toilet Transfer: Minimal assistance Toilet Transfer Method: Counselling psychologist: Bedside commode   Therapy/Group: Individual Therapy  Gypsy Decant 07/28/2019, 1:12 PM

## 2019-07-28 NOTE — Progress Notes (Signed)
PHYSICAL MEDICINE & REHABILITATION PROGRESS NOTE   Subjective/Complaints: Drains got pulled slightly during therapy yesterday- surgery called- they will remove every other staple and check placement of drains with CXR.   Pt reports feeling good- denies pain; slept well- needs to have a BM this AM- waited to go to bathroom until after saw physician.    ROS: Pt denies SOB, CP, abd pain, N/V/C/D   Objective:   DG Chest 2 View  Result Date: 07/27/2019 CLINICAL DATA:  Patient status post placement of a left pleural drainage catheter 1 week ago. EXAM: CHEST - 2 VIEW COMPARISON:  PA and lateral chest 07/20/2019. FINDINGS: Left IJ catheter has been removed. Left pleural drainage catheter is unchanged in position. Small left effusion and basilar atelectasis noted. No pneumothorax. Trace right pleural effusion. Right lung is clear. Heart size upper normal. IMPRESSION: Left pleural drainage catheter is unchanged. Small left pleural effusion and basilar atelectasis are also unchanged. No pneumothorax. Status post right IJ catheter removal. Trace right pleural effusion. Electronically Signed   By: Inge Rise M.D.   On: 07/27/2019 15:54   Recent Labs    07/26/19 0907  WBC 8.5  HGB 10.0*  HCT 32.2*  PLT 329   Recent Labs    07/26/19 0907  NA 139  K 4.4  CL 101  CO2 32  GLUCOSE 101*  BUN 12  CREATININE 0.54  CALCIUM 8.7*    Intake/Output Summary (Last 24 hours) at 07/28/2019 0840 Last data filed at 07/28/2019 0800 Gross per 24 hour  Intake 420 ml  Output 150 ml  Net 270 ml     Physical Exam: Vital Signs Blood pressure (!) 122/50, pulse 65, temperature 97.9 F (36.6 C), temperature source Oral, resp. rate 16, height 5' (1.524 m), weight 59.2 kg, SpO2 97 %. Constitutional: awake, alert, appropriate, sitting up in bed; comfortable, NAD HEENT: conjugate gaze; oral mucosa slightly dry- chapped lips; O2 by [place]-  Neck: supple Cardiovascular:RRR- no  M/R/G Respiratory/Chest: CCTA B/L except slightly decreased on L side; 2 JP drains in place- still getting good output.  GI: soft, NT, ND, (+)BS hyperactive; midline incision- a little bloody drainage- mainly dried up. Ext: no clubbing, cyanosis, or edema Musculoskeletal:  General:1+ LE edema to mid shins B/L Cervical back: Normal range of motion.  Neurological: She is alertand oriented to person, place, and time. Dysphonia noted--mild. More prominent when she fatigues. Otherwise CN intact. UE motor 4/5 proximal to distal. LE: 3/5 HF, KE and 2-3/5 ADF/PF. Sensed pain and light touch in both feet. Skin: She isnot diaphoretic. Noerythema. 2 areas on sacrum- appear Stage II- peeling skin also noted    Assessment/Plan: 1. Functional deficits secondary to debility which require 3+ hours per day of interdisciplinary therapy in a comprehensive inpatient rehab setting.  Physiatrist is providing close team supervision and 24 hour management of active medical problems listed below.  Physiatrist and rehab team continue to assess barriers to discharge/monitor patient progress toward functional and medical goals  Care Tool:  Bathing    Body parts bathed by patient: Right arm, Left arm, Chest, Abdomen, Front perineal area, Buttocks, Right upper leg, Left upper leg, Right lower leg, Left lower leg, Face         Bathing assist Assist Level: Minimal Assistance - Patient > 75%     Upper Body Dressing/Undressing Upper body dressing   What is the patient wearing?: Pull over shirt    Upper body assist Assist Level: Supervision/Verbal cueing  Lower Body Dressing/Undressing Lower body dressing      What is the patient wearing?: Underwear/pull up, Pants     Lower body assist Assist for lower body dressing: Minimal Assistance - Patient > 75%     Toileting Toileting    Toileting assist Assist for toileting: Contact Guard/Touching assist     Transfers Chair/bed  transfer  Transfers assist     Chair/bed transfer assist level: Supervision/Verbal cueing Chair/bed transfer assistive device: Other(rollator)   Locomotion Ambulation   Ambulation assist   Ambulation activity did not occur: N/A  Assist level: Contact Guard/Touching assist Assistive device: Other (comment)(holding furniture to get to bed, simulating home set up) Max distance: 12'   Walk 10 feet activity   Assist     Assist level: Contact Guard/Touching assist Assistive device: Walker-rolling   Walk 50 feet activity   Assist    Assist level: Contact Guard/Touching assist Assistive device: Walker-rolling    Walk 150 feet activity   Assist Walk 150 feet activity did not occur: Safety/medical concerns(endurance)         Walk 10 feet on uneven surface  activity   Assist     Assist level: Minimal Assistance - Patient > 75% Assistive device: Rollator   Wheelchair     Assist Will patient use wheelchair at discharge?: Yes Type of Wheelchair: Manual Wheelchair activity did not occur: N/A  Wheelchair assist level: Dependent - Patient 0%      Wheelchair 50 feet with 2 turns activity    Assist        Assist Level: Dependent - Patient 0%   Wheelchair 150 feet activity     Assist      Assist Level: Dependent - Patient 0%   Blood pressure (!) 122/50, pulse 65, temperature 97.9 F (36.6 C), temperature source Oral, resp. rate 16, height 5' (1.524 m), weight 59.2 kg, SpO2 97 %.  Medical Problem List and Plan: 1.Functional and mobility deficitssecondary to debility after empyema, thoracotomy and associated deficts -patient maynot yetshower due to JP drains -ELOS/Goals: mod I with PT, OT, SLP---10-14 days 2. Antithrombotics: -DVT/anticoagulation:Pharmaceutical:Other (comment)--Eliquis -antiplatelet therapy: N/A 3.OA/Pain Management:has been using Celebrex bid for years.Tylenol prn  3/2-  having a lot of pain in knees and LEs in general- not asking for additional meds, but might give tramadol prn.  4. Mood:LCSW to follow for evaluation and support. -antipsychotic agents: N/A 5. Neuropsych: This patientiscapable of making decisions on herown behalf. 6. Skin/Wound Care:Routine pressure relief measures -barrier cream to sacral wounds -JP sites clean and dry 7. Fluids/Electrolytes/Nutrition:Monitor I/O. Offer supplements between meals.  8. Empyema s/p drainage:  Colchicine added on 2/26 "to try to get tubes drier"  -both JP's drained about 180cc over last 24 hours  3/2- JPs are >1/2 full for both of them- will con't JP drains per surgery.   3/3- taking out part of staples- appears by CXR that drains are in place.   -continue per surgery team 9. PAF: In NSR on amiodarone. Eliquis on board since 2/24 10 Fluid overload: Monitor weights daily. Continue lasix. -heart failure team following   Filed Weights   07/26/19 0500 07/27/19 0500 07/28/19 0500  Weight: 64.4 kg 60 kg 59.2 kg    3/2- weight stable at 60 kg  3/3- weight stable/down slightly 59.2 kg 11. ABLA:  2/28: trending down 10.4-->9.7-->9.4-->8.9-->8.6 --> 8.5 2/28   -requested stool sample for OB  -mild blood loss from drains  Continue to follow serially  -fe++ supp  3/3- Hb  back up to 10 last check -labs this AM- not back yet.  12. Severe protein calorie malnutrition:encourage PO, protein supps 13. COPD- oxygen dependent- acute on chronic hypoxic resp failure- since for the past year but did not start using it till Sept.Respiratiory status stable on Dulera and Singulair.   3/1- tolerating 2L Of O2- comfortable  3/2- on home dose of O2- had a little phlegm this AM, but no Sx's of illness otherwise- will con't to monitor closely.  14. Nausea/Vomiting:Has had stomach issues for year. N/V has resolved--continue Protonix--decreased to daily.Celebrex likely  contributing to GI distress.    LOS: 4 days A FACE TO FACE EVALUATION WAS PERFORMED  Connie Jarvis 07/28/2019, 8:40 AM

## 2019-07-29 ENCOUNTER — Ambulatory Visit (HOSPITAL_COMMUNITY): Payer: Medicare Other

## 2019-07-29 ENCOUNTER — Inpatient Hospital Stay (HOSPITAL_COMMUNITY): Payer: Medicare Other

## 2019-07-29 ENCOUNTER — Encounter (HOSPITAL_COMMUNITY): Payer: Medicare Other | Admitting: Occupational Therapy

## 2019-07-29 NOTE — Plan of Care (Signed)
  Problem: Consults Goal: RH GENERAL PATIENT EDUCATION Description: See Patient Education module for education specifics. Outcome: Progressing   Problem: RH SKIN INTEGRITY Goal: RH STG SKIN FREE OF INFECTION/BREAKDOWN Description: Skin remain free of further breakdown with mod assist Outcome: Progressing Goal: RH STG ABLE TO PERFORM INCISION/WOUND CARE W/ASSISTANCE Description: STG Able To Perform Incision/Wound Care With Assistance. Mod Outcome: Progressing

## 2019-07-29 NOTE — Progress Notes (Signed)
Physical Therapy Session Note  Patient Details  Name: Connie Jarvis MRN: [id number] Date of Birth: 02/18/1941  Today's Date: 07/29/2019 PT Individual Time: 0937-1047 PT Individual Time Calculation (min): 70 min   Short Term Goals: Week 1:  PT Short Term Goal 1 (Week 1): = LTGs  Skilled Therapeutic Interventions/Progress Updates:     Patient in recliner with her husband in the room present for family education upon PT arrival. Patient alert and agreeable to PT session. Patient reported mild B knee pain during session, RN made aware. PT provided repositioning, rest breaks, and distraction as pain interventions throughout session.   Therapeutic Activity: Bed Mobility: Patient performed supine to/from sit with supervision-min A for LE management in the ADL bed using a step stool placed under her feet in sitting to boost herself up and lift her LEs from an elevated height. Provided verbal cues for boosting technique using stool. Patient's husband was able to place stool and assist LEs into the bed. Transfers: Patient performed sit to/from stand x4 with supervision using the rollator. Provided verbal cues for safe guarding technique for transfers and locking breaks when standing and sitting with the rollator. Patient performed a simulated sedan height car transfer with supervision-min A for boosting up using a rollator. Provided cues for safe technique, and hand placement for assisting patient.  Gait Training:  Patient ambulated 10-15 feet x2 in the ADL apartment to simulate home ambulation using a rollator with supervision. Ambulated with decreased gait speed, decreased step length and height, increased B hip and knee flexion in stance, forward trunk lean, and downward head gaze. Provided verbal cues for paced breathing and erect posture. Instructed patient's husband to provide cues when patient was ambulating at home, as patient tends to hold her breath intermittently.   Wheelchair Mobility:  Patient  was transported while seated on the rollator with total A throughout session for energy conservation and time management. Patient's husband pushed the patient up/down the ramp while she remained seated in the rollator to simulate home entry.   Patient in recliner with her husband in the room at end of session with breaks locked, chair alarm set, and all needs within reach.    Therapy Documentation Precautions:  Precautions Precautions: Fall Precaution Comments: LUQ bulb drains; chronic O2 via [place] (2L) Restrictions Weight Bearing Restrictions: No RUE Weight Bearing: Non weight bearing    Therapy/Group: Individual Therapy  Zaide Mcclenahan L Daziah Hesler PT, DPT  07/29/2019, 4:37 PM

## 2019-07-29 NOTE — Progress Notes (Signed)
Connie Jarvis PHYSICAL MEDICINE & REHABILITATION PROGRESS NOTE   Subjective/Complaints:   Pt reports midline incision is bleeding- and wondering about changing dressing.   Otherwise has no complaints- LBM this AM>   ROS: Pt denies SOB, CP, abd pain, N/V/C/D   Objective:   DG Chest 2 View  Result Date: 07/27/2019 CLINICAL DATA:  Patient status post placement of a left pleural drainage catheter 1 week ago. EXAM: CHEST - 2 VIEW COMPARISON:  PA and lateral chest 07/20/2019. FINDINGS: Left IJ catheter has been removed. Left pleural drainage catheter is unchanged in position. Small left effusion and basilar atelectasis noted. No pneumothorax. Trace right pleural effusion. Right lung is clear. Heart size upper normal. IMPRESSION: Left pleural drainage catheter is unchanged. Small left pleural effusion and basilar atelectasis are also unchanged. No pneumothorax. Status post right IJ catheter removal. Trace right pleural effusion. Electronically Signed   By: Inge Rise M.D.   On: 07/27/2019 15:54   Recent Labs    07/28/19 0905  WBC 6.7  HGB 9.1*  HCT 29.4*  PLT 270   No results for input(s): NA, K, CL, CO2, GLUCOSE, BUN, CREATININE, CALCIUM in the last 72 hours.  Intake/Output Summary (Last 24 hours) at 07/29/2019 0917 Last data filed at 07/29/2019 0846 Gross per 24 hour  Intake 600 ml  Output 100 ml  Net 500 ml     Physical Exam: Vital Signs Blood pressure (!) 121/53, pulse 71, temperature 98.2 F (36.8 C), resp. rate 17, height 5' (1.524 m), weight 60.8 kg, SpO2 96 %. Constitutional: awake, alert, appropriate, laying supine in bed; NAD  HEENT: conjugate gaze; O2 by [place]-  Neck: supple Cardiovascular:RRR- no M/R/G Respiratory/Chest: CCTA B/L except slightly decreased on L side; 2 JP drains in place- still getting good output.  GI: soft, NT, ND; (+)BS; midline incision has bleeding ON dressing- no signs off active bleeding/bleeding beyond dressing- will have changed Ext: no  clubbing, cyanosis, or edema Musculoskeletal:  General:1+ LE edema to mid shins B/L Cervical back: Normal range of motion.  Neurological: She is alertand oriented to person, place, and time. Dysphonia noted--mild. More prominent when she fatigues. Otherwise CN intact. UE motor 4/5 proximal to distal. LE: 3/5 HF, KE and 2-3/5 ADF/PF. Sensed pain and light touch in both feet. Skin: She isnot diaphoretic. Noerythema. 2 areas on sacrum- appear Stage II- peeling skin also noted    Assessment/Plan: 1. Functional deficits secondary to debility which require 3+ hours per day of interdisciplinary therapy in a comprehensive inpatient rehab setting.  Physiatrist is providing close team supervision and 24 hour management of active medical problems listed below.  Physiatrist and rehab team continue to assess barriers to discharge/monitor patient progress toward functional and medical goals  Care Tool:  Bathing  Bathing activity did not occur: Refused Body parts bathed by patient: Right arm, Left arm, Chest, Abdomen, Front perineal area, Buttocks, Right upper leg, Left upper leg, Right lower leg, Left lower leg, Face         Bathing assist Assist Level: Minimal Assistance - Patient > 75%     Upper Body Dressing/Undressing Upper body dressing   What is the patient wearing?: Pull over shirt, Button up shirt    Upper body assist Assist Level: Set up assist    Lower Body Dressing/Undressing Lower body dressing      What is the patient wearing?: Underwear/pull up     Lower body assist Assist for lower body dressing: Minimal Assistance - Patient > 75%  Toileting Toileting    Toileting assist Assist for toileting: Contact Guard/Touching assist     Transfers Chair/bed transfer  Transfers assist     Chair/bed transfer assist level: Supervision/Verbal cueing Chair/bed transfer assistive device: Other(rollator)   Locomotion Ambulation   Ambulation assist    Ambulation activity did not occur: N/A  Assist level: Supervision/Verbal cueing Assistive device: Rollator Max distance: 50'   Walk 10 feet activity   Assist     Assist level: Supervision/Verbal cueing Assistive device: Rollator   Walk 50 feet activity   Assist    Assist level: Supervision/Verbal cueing Assistive device: Rollator    Walk 150 feet activity   Assist Walk 150 feet activity did not occur: Safety/medical concerns(endurance)         Walk 10 feet on uneven surface  activity   Assist     Assist level: Minimal Assistance - Patient > 75% Assistive device: Rollator   Wheelchair     Assist Will patient use wheelchair at discharge?: Yes Type of Wheelchair: Manual Wheelchair activity did not occur: N/A  Wheelchair assist level: Dependent - Patient 0%      Wheelchair 50 feet with 2 turns activity    Assist        Assist Level: Dependent - Patient 0%   Wheelchair 150 feet activity     Assist      Assist Level: Dependent - Patient 0%   Blood pressure (!) 121/53, pulse 71, temperature 98.2 F (36.8 C), resp. rate 17, height 5' (1.524 m), weight 60.8 kg, SpO2 96 %.  Medical Problem List and Plan: 1.Functional and mobility deficitssecondary to debility after empyema, thoracotomy and associated deficts -patient maynot yetshower due to JP drains -ELOS/Goals: mod I with PT, OT, SLP---10-14 days 2. Antithrombotics: -DVT/anticoagulation:Pharmaceutical:Other (comment)--Eliquis -antiplatelet therapy: N/A 3.OA/Pain Management:has been using Celebrex bid for years.Tylenol prn  3/2- having a lot of pain in knees and LEs in general- not asking for additional meds, but might give tramadol prn.  4. Mood:LCSW to follow for evaluation and support. -antipsychotic agents: N/A 5. Neuropsych: This patientiscapable of making decisions on herown behalf. 6. Skin/Wound Care:Routine  pressure relief measures -barrier cream to sacral wounds -JP sites clean and dry  3/4- will have nursing change midline incision.  7. Fluids/Electrolytes/Nutrition:Monitor I/O. Offer supplements between meals.  8. Empyema s/p drainage:  Colchicine added on 2/26 "to try to get tubes drier"  -both JP's drained about 180cc over last 24 hours  3/2- JPs are >1/2 full for both of them- will con't JP drains per surgery.   3/3- taking out part of staples- appears by CXR that drains are in place.   -continue per surgery team 9. PAF: In NSR on amiodarone. Eliquis on board since 2/24 10 Fluid overload: Monitor weights daily. Continue lasix. -heart failure team following   Filed Weights   07/27/19 0500 07/28/19 0500 07/29/19 0533  Weight: 60 kg 59.2 kg 60.8 kg    3/2- weight stable at 60 kg  3/3- weight stable/down slightly 59.2 kg  3/4- Weight 60.8kg- still low- will monitor - not sure added 3-4 lbs in 1 day? 11. ABLA:  2/28: trending down 10.4-->9.7-->9.4-->8.9-->8.6 --> 8.5 2/28   -requested stool sample for OB  -mild blood loss from drains  Continue to follow serially  -fe++ supp  3/3- Hb back up to 10 last check -labs this AM- not back yet.  12. Severe protein calorie malnutrition:encourage PO, protein supps 13. COPD- oxygen dependent- acute on chronic hypoxic resp failure-  since for the past year but did not start using it till Sept.Respiratiory status stable on Dulera and Singulair.   3/1- tolerating 2L Of O2- comfortable  3/2- on home dose of O2- had a little phlegm this AM, but no Sx's of illness otherwise- will con't to monitor closely.  14. Nausea/Vomiting:Has had stomach issues for year. --continue Protonix--decreased to daily.Celebrex likely contributing to GI distress.  3/4- N/V resolved completely.     LOS: 5 days A FACE TO FACE EVALUATION WAS PERFORMED  Connie Jarvis 07/29/2019, 9:17 AM

## 2019-07-29 NOTE — Progress Notes (Signed)
Occupational Therapy Session Note  Patient Details  Name: PENLEY LEGREE MRN: [id number] Date of Birth: 25-Sep-1940  Today's Date: 07/29/2019 OT Individual Time: 1400-1455 OT Individual Time Calculation (min): 55 min    Short Term Goals: Week 1:  OT Short Term Goal 1 (Week 1): STGs equal to LTGs set at supervision to modified independent overall  Skilled Therapeutic Interventions/Progress Updates:    Patient seated in recliner, husband present and ready for family education session.  She denies pain, 2L O2 via [place].  Reviewed adl status and supervision/set up needs, monitoring and safe anchoring of drainage capsules.  Completed short distance ambulation with rollator CS.  Reviewed and practiced tub transfer bench that she plans to utilize when cleared to shower - CS level.  Patient and husband would like [place] to order tub bench prior to discharge - this has been communicated to SW.  Reviewed and practiced safe techniques for reach and transport of items in the kitchen environment using rollator for support both in stance and behind to sit when fatigued with CS and good strategies.  Returned to recliner at close of session with CS, returned to wall O2, 2L.  Reviewed energy conservation and gradual increase in activity in home environment.  Patient and husband demonstrate good understanding of items reviewed this session.    Therapy Documentation Precautions:  Precautions Precautions: Fall Precaution Comments: LUQ bulb drains; chronic O2 via [place] (2L) Restrictions Weight Bearing Restrictions: No RUE Weight Bearing: Non weight bearing General:   Vital Signs: Therapy Vitals Temp: 98.2 F (36.8 C) Pulse Rate: 71 Resp: 17 BP: (!) 121/53 Patient Position (if appropriate): Lying Oxygen Therapy SpO2: 96 % O2 Device: Nasal Cannula O2 Flow Rate (L/min): 2 L/min  Therapy/Group: Individual Therapy  [name] 07/29/2019, 7:35 AM

## 2019-07-29 NOTE — Progress Notes (Addendum)
Physical Therapy Session Note  Patient Details  Name: Connie Jarvis MRN: [id number] Date of Birth: 03-Jul-1940  Today's Date: 07/29/2019 PT Individual Time: [id number] PT Individual Time Calculation (min): 70 min   Short Term Goals: Week 1:  PT Short Term Goal 1 (Week 1): = LTGs  Skilled Therapeutic Interventions/Progress Updates:     Patient in bed on 2L/min O2 with RN changing abdominal dressing upon PT arrival. Patient reported mild B knee pain during session, RN made aware. PT provided repositioning, rest breaks, and distraction as pain interventions throughout session.   Therapeutic Activity: Bed Mobility: Patient performed supine to sit with supervision-mod I in a flat bed without use of bed rails. She sat EOB working on dynamic sitting blance as she donned B TED hose with supervision using the bag technique with increased time, she also donned pants, threading LEs in sitting and pulling up in standing with supervision, and doffed her gown and donned a shirt independently while managing O2 line.  Transfers: Patient performed sit to/from stand x5 with supervision using a rollator. Provided verbal cues for locking breaks x1. Required increased time due to knee pain with standing. She performed a toilet transfer using the rollator  Gait Training:  Patient ambulated ~15 feet to/from the bathroom and 75 feet using a rollator with close supervision for safety and O2 managment. Ambulated with decreased gait speed, decreased step length and height, increased B hip and knee flexion in stance, forward trunk lean, and downward head gaze. Provided verbal cues for erect posture, paced breathing, and increased step height for safety.  Therapeutic Exercise: Patient performed the following exercises with verbal and tactile cues for proper technique. -B LAQ 2x10 -Alternating seated marching 2x8 -B isometric hip abduction 2x5 with 5 sec hold -B isometric hip adduction 2x5 with 5 sec hold  Patient in  recliner in room at end of session with breaks locked, chair alarm set, and all needs within reach. Patient continues to requiring increased time and rest breaks with all mobility due to SOB. Patient remained on 2L/min with O2 sats >90% throughout session.   Therapy Documentation Precautions:  Precautions Precautions: Fall Precaution Comments: LUQ bulb drains; chronic O2 via  (2L) Restrictions Weight Bearing Restrictions: No RUE Weight Bearing: Non weight bearing    Therapy/Group: Individual Therapy  Zen Felling L Danyal Whitenack PT, DPT  07/29/2019, 4:19 PM

## 2019-07-30 ENCOUNTER — Inpatient Hospital Stay (HOSPITAL_COMMUNITY): Payer: Medicare Other | Admitting: Occupational Therapy

## 2019-07-30 ENCOUNTER — Inpatient Hospital Stay (HOSPITAL_COMMUNITY): Payer: Medicare Other

## 2019-07-30 NOTE — Progress Notes (Signed)
Occupational Therapy Session Note  Patient Details  Name: DONNELL BEAUCHAMP MRN: [id number] Date of Birth: 10/21/1940  Today's Date: 07/30/2019 OT Individual Time: 1303-1403 OT Individual Time Calculation (min): 60 min    Short Term Goals: Week 1:  OT Short Term Goal 1 (Week 1): STGs equal to LTGs set at supervision to modified independent overall  Skilled Therapeutic Interventions/Progress Updates:  Patient met seated in recliner in agreement with OT treatment session. Skilled OT services provided for functional transfers, functional mobility, and community reintegration as detailed below. Patient ambulated to and from therapy gym with CGA/Sup A and use of rollator with 1 seated rest break. Patient on 2L O2 via [place] throughout treatment session with SpO2 of 100% after activity. OT provided education on safety with rollator including locking breaks before sitting/standing and use of seat on rollator in community. All stand pivot transfers with Sup A this date. Patient able to return demonstrate throughout treatment session with good accuracy. Unsupported dynamic standing balance activity with patient requiring rest breaks after 3-4 minutes of activity. Patient left seated in recliner with call bell within reach and needs met.   Therapy Documentation Precautions:  Precautions Precautions: Fall Precaution Comments: LUQ bulb drains; chronic O2 via [place] (2L) Restrictions Weight Bearing Restrictions: No RUE Weight Bearing: Non weight bearing  Pain: 0/10 pain reported at rest and with activity.   Therapy/Group: Individual Therapy  Margarine Grosshans R [place] 07/30/2019, 1:02 PM

## 2019-07-30 NOTE — Progress Notes (Signed)
Occupational Therapy Session Note  Patient Details  Name: Connie Jarvis MRN: [id number] Date of Birth: 08-Mar-1941  Today's Date: 07/30/2019 OT Individual Time: [id number] OT Individual Time Calculation (min): 58 min   Short Term Goals: Week 1:  OT Short Term Goal 1 (Week 1): STGs equal to LTGs set at supervision to modified independent overall  Skilled Therapeutic Interventions/Progress Updates:    Pt greeted in the recliner, on 2L 02 via [place], requesting to use the restroom. Ambulatory transfer to toilet completed using rollator with supervision assist. Pts room 02 able to extend into the bathroom. She had continent B+B void, able to complete hygiene herself by sitting dynamically while using the rollator seat for balance support. Close supervision for dynamic standing balance during clothing mgt. Afterwards pt completed UB self care while sitting on her rollator seat in front of the sink with setup assist. We discussed returning to leisure interests of flower gardening and reading at home with pt verbalizing she was planning to put more flowerpots in the house and on her patio. She ambulated with rollator to the recliner and then for remainder of session we went over an energy conservation printout together. Pt very engaged in discussion, highlighting sections that were more pertinent to her in regards to ADL/IADL participation at home. At end of session pt remained in the recliner with all needs and chair alarm set. Pt throughout session very appreciative of education and the opportunities for therapeutic activity.   Therapy Documentation Precautions:  Precautions Precautions: Fall Precaution Comments: LUQ bulb drains; chronic O2 via [place] (2L) Restrictions Weight Bearing Restrictions: No RUE Weight Bearing: Non weight bearing Pain: Pt denied pain during session    ADL: ADL Eating: Independent Where Assessed-Eating: Wheelchair Grooming: Setup Where Assessed-Grooming: Wheelchair Upper Body  Bathing: Setup Where Assessed-Upper Body Bathing: Wheelchair Lower Body Bathing: Minimal assistance Where Assessed-Lower Body Bathing: Wheelchair, Sitting at sink, Standing at sink Upper Body Dressing: Setup Where Assessed-Upper Body Dressing: Wheelchair Lower Body Dressing: Minimal assistance Where Assessed-Lower Body Dressing: Wheelchair, Sitting at sink, Standing at sink Toileting: Minimal assistance Where Assessed-Toileting: Bedside Commode Toilet Transfer: Minimal assistance Toilet Transfer Method: Insurance claims handler Equipment: Bedside commode :     Therapy/Group: Individual Therapy  Kathryn Cosby A Tyreka Henneke 07/30/2019, 12:38 PM

## 2019-07-30 NOTE — Plan of Care (Signed)
  Problem: Consults Goal: RH GENERAL PATIENT EDUCATION Description: See Patient Education module for education specifics. Outcome: Progressing   Problem: RH SKIN INTEGRITY Goal: RH STG SKIN FREE OF INFECTION/BREAKDOWN Description: Skin remain free of further breakdown with mod assist Outcome: Progressing

## 2019-07-30 NOTE — Progress Notes (Addendum)
Social Work Patient ID: Connie Jarvis, female   DOB: 1940/06/26, 79 y.o.   MRN: [id number]   SW met with pt in room to follow-up about DME recommendations:TTB and HHPT/OT. SW provided pt with HHA. Pt understands TTB will not be covered under insurance and would like for one to be ordered. SW to follow up about HHA preference.    DME order sent to- [place] for TTB (cost $60).  *SW received phone call from pt who reported preferred HHA is [facility] [phone]: [phone]). SW spoke with Kendra/Intake with [facility] who reported pt can return, and encouraged follow-up on Monday to determine if able to accept pt for services.    Loralee Pacas, MSW, [place] Office: [phone] Cell: [phone] Fax: [phone]

## 2019-07-30 NOTE — Progress Notes (Signed)
[place] PHYSICAL MEDICINE & REHABILITATION PROGRESS NOTE   Subjective/Complaints:   Had tylenol last night due to headache and knee pain- otherwise feeling well- denies any other issues- admits they haven't been using midline, so willing to have it removed.      ROS:  Pt denies SOB, CP, N/V/C/D   Objective:   No results found. Recent Labs    07/28/19 0905  WBC 6.7  HGB 9.1*  HCT 29.4*  PLT 270   No results for input(s): NA, K, CL, CO2, GLUCOSE, BUN, CREATININE, CALCIUM in the last 72 hours.  Intake/Output Summary (Last 24 hours) at 07/30/2019 0913 Last data filed at 07/30/2019 0732 Gross per 24 hour  Intake 710 ml  Output 125 ml  Net 585 ml     Physical Exam: Vital Signs Blood pressure (!) 111/47, pulse 67, temperature 98.1 F (36.7 C), resp. rate 17, height 5' (1.524 m), weight 60.4 kg, SpO2 95 %. Constitutional: awake, alert, appropriate , lying in bed, NAD HEENT: dry lips but wearing O2 by [place]- 2L Neck: supple Cardiovascular:RRR- no JVD Respiratory/Chest: CTA B/L except L base is slightly decreased- JP drains in L chest- draining well 1/2 full again this AM of serous drainage  GI: soft, NT, ND, (+)BS; midline incision covered with dressing- a little sanguinous drainage at top, but not draining out of dressing. Dressing intact Ext: no clubbing, cyanosis, or edema Musculoskeletal:  General:1+ edema to shins B/L Cervical back: Normal range of motion.  Neurological: Alert oriented x3 Dysphonia noted--mild. More prominent when she fatigues. Otherwise CN intact. UE motor 4/5 proximal to distal. LE: 3/5 HF, KE and 2-3/5 ADF/PF. Sensed pain and light touch in both feet. Skin:  Dry; but not superdry 2 areas on sacrum- appear Stage II- peeling skin also noted    Assessment/Plan: 1. Functional deficits secondary to debility which require 3+ hours per day of interdisciplinary therapy in a comprehensive inpatient rehab setting.  Physiatrist is providing  close team supervision and 24 hour management of active medical problems listed below.  Physiatrist and rehab team continue to assess barriers to discharge/monitor patient progress toward functional and medical goals  Care Tool:  Bathing  Bathing activity did not occur: Refused Body parts bathed by patient: Right arm, Left arm, Chest, Abdomen, Front perineal area, Buttocks, Right upper leg, Left upper leg, Right lower leg, Left lower leg, Face         Bathing assist Assist Level: Minimal Assistance - Patient > 75%     Upper Body Dressing/Undressing Upper body dressing   What is the patient wearing?: Pull over shirt, Button up shirt    Upper body assist Assist Level: Set up assist    Lower Body Dressing/Undressing Lower body dressing      What is the patient wearing?: Underwear/pull up     Lower body assist Assist for lower body dressing: Minimal Assistance - Patient > 75%     Toileting Toileting    Toileting assist Assist for toileting: Contact Guard/Touching assist     Transfers Chair/bed transfer  Transfers assist     Chair/bed transfer assist level: Supervision/Verbal cueing Chair/bed transfer assistive device: Other(rollator)   Locomotion Ambulation   Ambulation assist   Ambulation activity did not occur: N/A  Assist level: Supervision/Verbal cueing Assistive device: Rollator Max distance: 75'   Walk 10 feet activity   Assist     Assist level: Supervision/Verbal cueing Assistive device: Rollator   Walk 50 feet activity   Assist  Assist level: Supervision/Verbal cueing Assistive device: Rollator    Walk 150 feet activity   Assist Walk 150 feet activity did not occur: Safety/medical concerns(endurance)         Walk 10 feet on uneven surface  activity   Assist     Assist level: Minimal Assistance - Patient > 75% Assistive device: Rollator   Wheelchair     Assist Will patient use wheelchair at discharge?: Yes Type  of Wheelchair: Manual Wheelchair activity did not occur: N/A  Wheelchair assist level: Dependent - Patient 0%      Wheelchair 50 feet with 2 turns activity    Assist        Assist Level: Dependent - Patient 0%   Wheelchair 150 feet activity     Assist      Assist Level: Dependent - Patient 0%   Blood pressure (!) 111/47, pulse 67, temperature 98.1 F (36.7 C), resp. rate 17, height 5' (1.524 m), weight 60.4 kg, SpO2 95 %.  Medical Problem List and Plan: 1.Functional and mobility deficitssecondary to debility after empyema, thoracotomy and associated deficts -patient maynot yetshower due to JP drains -ELOS/Goals: mod I with PT, OT, SLP---10-14 days 2. Antithrombotics: -DVT/anticoagulation:Pharmaceutical:Other (comment)--Eliquis -antiplatelet therapy: N/A 3.OA/Pain Management:has been using Celebrex bid for years.Tylenol prn  3/2- having a lot of pain in knees and LEs in general- not asking for additional meds, but might give tramadol prn.  4. Mood:LCSW to follow for evaluation and support. -antipsychotic agents: N/A 5. Neuropsych: This patientiscapable of making decisions on herown behalf. 6. Skin/Wound Care:Routine pressure relief measures -barrier cream to sacral wounds -JP sites clean and dry  3/4- will have nursing change midline incision.   3/5- drainage improved after staples removed.  7. Fluids/Electrolytes/Nutrition:Monitor I/O. Offer supplements between meals.  8. Empyema s/p drainage:  Colchicine added on 2/26 "to try to get tubes drier"  -both JP's drained about 180cc over last 24 hours  3/2- JPs are >1/2 full for both of them- will con't JP drains per surgery.   3/3- taking out part of staples- appears by CXR that drains are in place.  3/5- JP drains still putting out a lot- 1/2 full with serous drainage this AM   -continue per surgery team 9. PAF: In NSR on  amiodarone. Eliquis on board since 2/24 10 Fluid overload: Monitor weights daily. Continue lasix. -heart failure team following   Filed Weights   07/28/19 0500 07/29/19 0533 07/30/19 0500  Weight: 59.2 kg 60.8 kg 60.4 kg    3/2- weight stable at 60 kg  3/3- weight stable/down slightly 59.2 kg  3/4- Weight 60.8kg- still low- will monitor - not sure added 3-4 lbs in 1 day?  3/5- weight 60.4 kg- improved 11. ABLA:  2/28: trending down 10.4-->9.7-->9.4-->8.9-->8.6 --> 8.5 2/28   -requested stool sample for OB  -mild blood loss from drains  Continue to follow serially  -fe++ supp  3/3- Hb back up to 10 last check -labs this AM- not back yet.  12. Severe protein calorie malnutrition:encourage PO, protein supps 13. COPD- oxygen dependent- acute on chronic hypoxic resp failure- since for the past year but did not start using it till Sept.Respiratiory status stable on Dulera and Singulair.   3/1- tolerating 2L Of O2- comfortable  3/2- on home dose of O2- had a little phlegm this AM, but no Sx's of illness otherwise- will con't to monitor closely.   3/5- tolerating 2L O2- sounds good- con't regimen 14. Nausea/Vomiting:Has had stomach issues for  year. --continue Protonix--decreased to daily.Celebrex likely contributing to GI distress.  3/4- N/V resolved completely.   15. IVs- will d/c midline since not using 3/5  LOS: 6 days A FACE TO FACE EVALUATION WAS PERFORMED  Venkat Ankney 07/30/2019, 9:13 AM

## 2019-07-30 NOTE — Progress Notes (Signed)
Physical Therapy Session Note  Patient Details  Name: Connie Jarvis MRN: [id number] Date of Birth: Mar 05, 1941  Today's Date: 07/30/2019 PT Individual Time: 0900-1000 PT Individual Time Calculation (min): 60 min   Short Term Goals: Week 1:  PT Short Term Goal 1 (Week 1): = LTGs  Skilled Therapeutic Interventions/Progress Updates:   Received pt supine in bed, pt agreeable to therapy, and denied any pain during session. Pt on 2L O2 via [place] throughout session. Session focused on functional mobility/transfers, dressing, toileting, LE strength, dynamic standing balance, ambulation, and improved endurance with activity. Pt performed bed mobility with supervision. Pt required max A to don ted hose for time management purposes. Pt donned underwear, pants, and shoes sitting EOB and transferred sit<>stand CGA to pull pants/underwear over hips. Pt donned pull over shirt and button down shirt sitting EOB with supervision. Therapist assisted in repositioning drains to attach to shirt. Pt transferred stand<>pivot bed<>WC without AD with close supervision. Pt transported to gym in [facility] for time management purposes. Pt ambulated 133ft with rollator CGA/supervision. Pt required verbal cues for breathing techniques and energy conservation strategies. Pt took 3 standing rest breaks while ambulating. O2 sat 96% increasing to 99% within 45 seconds of seated rest break. Pt reported her legs began to feel weak towards the end but denied any pain. Pt ambulated 66ft without AD CGA to Nustep. Pt performed bilateral UE and LE strengthening on Nustep at workload 3 for 8 minutes, number of steps unavailable to see on screen. Pt reported urge to use restroom and was transported back to room in [facility] total assist. Pt ambulated 47ft with rollator CGA to bathroom and performed toilet transfer and LE clothing management and hygeine management with CGA. Pt able to void. Pt washed hands and brushed teeth standing at sink with supervision. Pt  ambulated 60ft with rollator back to recliner. Concluded session with pt sitting in recliner, needs within reach, and chair pad alarm on. Therapist wrote down today's activities in pt's notebook.   Therapy Documentation Precautions:  Precautions Precautions: Fall Precaution Comments: LUQ bulb drains; chronic O2 via [place] (2L) Restrictions Weight Bearing Restrictions: No RUE Weight Bearing: Non weight bearing  Therapy/Group: Individual Therapy Alfonse Alpers PT, DPT   07/30/2019, 7:37 AM

## 2019-07-31 NOTE — Progress Notes (Signed)
[place] PHYSICAL MEDICINE & REHABILITATION PROGRESS NOTE   Subjective/Complaints: No complaints this morning. Husband is visiting.  Asks me to sign her notebook so she remembers that I visited. Denies pain, constipation, insomnia.   ROS:  Pt denies SOB, CP, N/V/C/D   Objective:   No results found. No results for input(s): WBC, HGB, HCT, PLT in the last 72 hours. No results for input(s): NA, K, CL, CO2, GLUCOSE, BUN, CREATININE, CALCIUM in the last 72 hours.  Intake/Output Summary (Last 24 hours) at 07/31/2019 1301 Last data filed at 07/31/2019 1000 Gross per 24 hour  Intake 480 ml  Output 175 ml  Net 305 ml     Physical Exam: Vital Signs Blood pressure 131/66, pulse 70, temperature 98 F (36.7 C), resp. rate 16, height 5' (1.524 m), weight 61.8 kg, SpO2 99 %. Constitutional: awake, alert, appropriate, sitting up in chair, no distress, husband is at side.  HEENT: dry lips but wearing O2 by [place]- 2L Neck: supple Cardiovascular:RRR- no JVD Respiratory/Chest: CTA B/L except L base is slightly decreased- JP drains in L chest- draining well 1/2 full again this AM of serous drainage  GI: soft, NT, ND, (+)BS; midline incision covered with dressing- a little sanguinous drainage at top, but not draining out of dressing. Dressing intact Ext: no clubbing, cyanosis, or edema Musculoskeletal:  General:1+ edema to shins B/L Cervical back: Normal range of motion.  Neurological: Alert oriented x3 Dysphonia noted--mild. More prominent when she fatigues. Otherwise CN intact. UE motor 4/5 proximal to distal. LE: 3/5 HF, KE and 2-3/5 ADF/PF. Sensed pain and light touch in both feet. Skin:  Dry; but not superdry 2 areas on sacrum- appear Stage II- peeling skin also noted    Assessment/Plan: 1. Functional deficits secondary to debility which require 3+ hours per day of interdisciplinary therapy in a comprehensive inpatient rehab setting.  Physiatrist is providing close team  supervision and 24 hour management of active medical problems listed below.  Physiatrist and rehab team continue to assess barriers to discharge/monitor patient progress toward functional and medical goals  Care Tool:  Bathing  Bathing activity did not occur: Refused Body parts bathed by patient: Right arm, Left arm, Chest, Abdomen, Front perineal area, Buttocks, Right upper leg, Left upper leg, Right lower leg, Left lower leg, Face         Bathing assist Assist Level: Minimal Assistance - Patient > 75%     Upper Body Dressing/Undressing Upper body dressing   What is the patient wearing?: Pull over shirt, Button up shirt    Upper body assist Assist Level: Supervision/Verbal cueing    Lower Body Dressing/Undressing Lower body dressing      What is the patient wearing?: Underwear/pull up, Pants     Lower body assist Assist for lower body dressing: Contact Guard/Touching assist     Toileting Toileting    Toileting assist Assist for toileting: Supervision/Verbal cueing     Transfers Chair/bed transfer  Transfers assist  Chair/bed transfer activity did not occur: N/A  Chair/bed transfer assist level: Supervision/Verbal cueing Chair/bed transfer assistive device: Other(rollator)   Locomotion Ambulation   Ambulation assist   Ambulation activity did not occur: N/A  Assist level: Supervision/Verbal cueing Assistive device: Rollator Max distance: 136ft   Walk 10 feet activity   Assist     Assist level: Supervision/Verbal cueing Assistive device: Rollator   Walk 50 feet activity   Assist    Assist level: Supervision/Verbal cueing Assistive device: Rollator    Walk 150  feet activity   Assist Walk 150 feet activity did not occur: Safety/medical concerns(endurance)  Assist level: Supervision/Verbal cueing Assistive device: Rollator    Walk 10 feet on uneven surface  activity   Assist     Assist level: Minimal Assistance - Patient >  75% Assistive device: Rollator   Wheelchair     Assist Will patient use wheelchair at discharge?: Yes Type of Wheelchair: Manual Wheelchair activity did not occur: N/A  Wheelchair assist level: Dependent - Patient 0%      Wheelchair 50 feet with 2 turns activity    Assist        Assist Level: Dependent - Patient 0%   Wheelchair 150 feet activity     Assist      Assist Level: Dependent - Patient 0%   Blood pressure 131/66, pulse 70, temperature 98 F (36.7 C), resp. rate 16, height 5' (1.524 m), weight 61.8 kg, SpO2 99 %.  Medical Problem List and Plan: 1.Functional and mobility deficitssecondary to debility after empyema, thoracotomy and associated deficts -patient maynot yetshower due to JP drains -ELOS/Goals: mod I with PT, OT, SLP---10-14 days  -Continue CIR PT, OT 2. Antithrombotics: -DVT/anticoagulation:Pharmaceutical:Other (comment)--Eliquis -antiplatelet therapy: N/A 3.OA/Pain Management:has been using Celebrex bid for years.Tylenol prn  3/2- having a lot of pain in knees and LEs in general- not asking for additional meds, but might give tramadol prn. Currently well controlled.  4. Mood:LCSW to follow for evaluation and support. -antipsychotic agents: N/A 5. Neuropsych: This patientiscapable of making decisions on herown behalf. 6. Skin/Wound Care:Routine pressure relief measures -barrier cream to sacral wounds -JP sites clean and dry  3/4- will have nursing change midline incision.   3/5- drainage improved after staples removed.  7. Fluids/Electrolytes/Nutrition:Monitor I/O. Offer supplements between meals.  8. Empyema s/p drainage:  Colchicine added on 2/26 "to try to get tubes drier"  -both JP's drained about 180cc over last 24 hours  3/2- JPs are >1/2 full for both of them- will con't JP drains per surgery.   3/3- taking out part of staples- appears by  CXR that drains are in place.  3/5- JP drains still putting out a lot- 1/2 full with serous drainage this AM   -continue per surgery team 9. PAF: In NSR on amiodarone. Eliquis on board since 2/24 10 Fluid overload: Monitor weights daily. Continue lasix. -heart failure team following   Filed Weights   07/29/19 0533 07/30/19 0500 07/31/19 0443  Weight: 60.8 kg 60.4 kg 61.8 kg    3/2- weight stable at 60 kg  3/3- weight stable/down slightly 59.2 kg  3/4- Weight 60.8kg- still low- will monitor - not sure added 3-4 lbs in 1 day?  3/5- weight 60.4 kg- improved  3/6: weight increased.  11. ABLA:  2/28: trending down 10.4-->9.7-->9.4-->8.9-->8.6 --> 8.5 2/28   -requested stool sample for OB  -mild blood loss from drains  Continue to follow serially  -fe++ supp  3/3- Hb back up to 10 last check -labs this AM- not back yet.  12. Severe protein calorie malnutrition:encourage PO, protein supps 13. COPD- oxygen dependent- acute on chronic hypoxic resp failure- since for the past year but did not start using it till Sept.Respiratiory status stable on Dulera and Singulair.   3/1- tolerating 2L Of O2- comfortable  3/2- on home dose of O2- had a little phlegm this AM, but no Sx's of illness otherwise- will con't to monitor closely.   3/5- tolerating 2L O2- sounds good- con't regimen 14. Nausea/Vomiting:Has had  stomach issues for year. --continue Protonix--decreased to daily.Celebrex likely contributing to GI distress.  3/4- N/V resolved completely. No additional complaints.   15. IVs- will d/c midline since not using 3/5  LOS: 7 days A FACE TO FACE EVALUATION WAS PERFORMED  Michah Minton P Jatavian Calica 07/31/2019, 1:01 PM

## 2019-07-31 NOTE — Plan of Care (Signed)
  Problem: Consults Goal: RH GENERAL PATIENT EDUCATION Description: See Patient Education module for education specifics. Outcome: Progressing   Problem: RH SKIN INTEGRITY Goal: RH STG SKIN FREE OF INFECTION/BREAKDOWN Description: Skin remain free of further breakdown with mod assist Outcome: Progressing Goal: RH STG ABLE TO PERFORM INCISION/WOUND CARE W/ASSISTANCE Description: STG Able To Perform Incision/Wound Care With Assistance. Mod Outcome: Progressing

## 2019-08-01 ENCOUNTER — Inpatient Hospital Stay (HOSPITAL_COMMUNITY): Payer: Medicare Other | Admitting: Occupational Therapy

## 2019-08-01 NOTE — Progress Notes (Signed)
Occupational Therapy Session Note  Patient Details  Name: Connie Jarvis MRN: [id number] Date of Birth: February 08, 1941  Today's Date: 08/01/2019 OT Individual Time: 1405-1500 OT Individual Time Calculation (min): 55 min    Short Term Goals: Week 1:  OT Short Term Goal 1 (Week 1): STGs equal to LTGs set at supervision to modified independent overall  Skilled Therapeutic Interventions/Progress Updates:    Pt seen this session to focus on LE strength and activity tolerance. Pt received in recliner and donned her shoes and stood up to rollator with good recall of safety steps.  With close S pt ambulated at her pace to gym and sat on mat.    To work on dynamic balance and trunk movements needed to enable her to be safer with toileting skills, pt worked on sit to stand from mat with only using her arms to push up slightly to force her legs to do more, stand to sit with legs only with S, standing with no UE support while performing trunk rotation and lateral side bends moving a hula hoop back and forth. She did very well with this and able to complete 3 sets.  UE AROM ex with overhead reaching with hula hoop.   Theraband exercises for upper back and legs. Pt provided with level 1 theraband.   Pt then ambulated back to room. Excellent participation today with good improvement in balance and endurance. Pt resting in recliner with all needs met.  Therapy Documentation Precautions:  Precautions Precautions: Fall Precaution Comments: LUQ bulb drains; chronic O2 via [place] (2L) Restrictions Weight Bearing Restrictions: No RUE Weight Bearing: Non weight bearing  Pain: Pain Assessment Pain Scale: 0-10 Pain Score: 0-No pain       Therapy/Group: Individual Therapy  [place] 08/01/2019, 12:48 PM

## 2019-08-01 NOTE — Plan of Care (Signed)
  Problem: Consults Goal: RH GENERAL PATIENT EDUCATION Description: See Patient Education module for education specifics. Outcome: Progressing   Problem: RH SKIN INTEGRITY Goal: RH STG SKIN FREE OF INFECTION/BREAKDOWN Description: Skin remain free of further breakdown with mod assist Outcome: Progressing Goal: RH STG ABLE TO PERFORM INCISION/WOUND CARE W/ASSISTANCE Description: STG Able To Perform Incision/Wound Care With Assistance. Mod Outcome: Progressing

## 2019-08-01 NOTE — Progress Notes (Signed)
[place] PHYSICAL MEDICINE & REHABILITATION PROGRESS NOTE   Subjective/Complaints: No complaints this morning. On commode.   ROS:  Pt denies SOB, CP, N/V/C/D   Objective:   No results found. No results for input(s): WBC, HGB, HCT, PLT in the last 72 hours. No results for input(s): NA, K, CL, CO2, GLUCOSE, BUN, CREATININE, CALCIUM in the last 72 hours.  Intake/Output Summary (Last 24 hours) at 08/01/2019 1018 Last data filed at 08/01/2019 0915 Gross per 24 hour  Intake 360 ml  Output 100 ml  Net 260 ml     Physical Exam: Vital Signs Blood pressure (!) 111/52, pulse 66, temperature 98.2 F (36.8 C), temperature source Oral, resp. rate 16, height 5' (1.524 m), weight 58.7 kg, SpO2 100 %. Constitutional: awake, alert, appropriate, on commode HEENT: dry lips but wearing O2 by [place]- 2L Neck: supple Cardiovascular:RRR- no JVD Respiratory/Chest: CTA B/L except L base is slightly decreased- JP drains in L chest- draining well 1/2 full again this AM of serous drainage  GI: soft, NT, ND, (+)BS; midline incision covered with dressing- a little sanguinous drainage at top, but not draining out of dressing. Dressing intact Ext: no clubbing, cyanosis, or edema Musculoskeletal:  General:1+ edema to shins B/L Cervical back: Normal range of motion.  Neurological: Alert oriented x3 Dysphonia noted--mild. More prominent when she fatigues. Otherwise CN intact. UE motor 4/5 proximal to distal. LE: 3/5 HF, KE and 2-3/5 ADF/PF. Sensed pain and light touch in both feet. Skin:  Dry; but not superdry 2 areas on sacrum- appear Stage II- peeling skin also noted    Assessment/Plan: 1. Functional deficits secondary to debility which require 3+ hours per day of interdisciplinary therapy in a comprehensive inpatient rehab setting.  Physiatrist is providing close team supervision and 24 hour management of active medical problems listed below.  Physiatrist and rehab team continue to assess  barriers to discharge/monitor patient progress toward functional and medical goals  Care Tool:  Bathing  Bathing activity did not occur: Refused Body parts bathed by patient: Right arm, Left arm, Chest, Abdomen, Front perineal area, Buttocks, Right upper leg, Left upper leg, Right lower leg, Left lower leg, Face         Bathing assist Assist Level: Minimal Assistance - Patient > 75%     Upper Body Dressing/Undressing Upper body dressing   What is the patient wearing?: Pull over shirt, Button up shirt    Upper body assist Assist Level: Supervision/Verbal cueing    Lower Body Dressing/Undressing Lower body dressing      What is the patient wearing?: Underwear/pull up, Pants     Lower body assist Assist for lower body dressing: Contact Guard/Touching assist     Toileting Toileting    Toileting assist Assist for toileting: Supervision/Verbal cueing     Transfers Chair/bed transfer  Transfers assist  Chair/bed transfer activity did not occur: N/A  Chair/bed transfer assist level: Supervision/Verbal cueing Chair/bed transfer assistive device: Programmer, multimedia   Ambulation assist   Ambulation activity did not occur: N/A  Assist level: Supervision/Verbal cueing Assistive device: Rollator Max distance: 188ft   Walk 10 feet activity   Assist     Assist level: Supervision/Verbal cueing Assistive device: Rollator   Walk 50 feet activity   Assist    Assist level: Supervision/Verbal cueing Assistive device: Rollator    Walk 150 feet activity   Assist Walk 150 feet activity did not occur: Safety/medical concerns(endurance)  Assist level: Supervision/Verbal cueing Assistive device: Rollator  Walk 10 feet on uneven surface  activity   Assist     Assist level: Minimal Assistance - Patient > 75% Assistive device: Rollator   Wheelchair     Assist Will patient use wheelchair at discharge?: Yes Type of Wheelchair:  Manual Wheelchair activity did not occur: N/A  Wheelchair assist level: Dependent - Patient 0%      Wheelchair 50 feet with 2 turns activity    Assist        Assist Level: Dependent - Patient 0%   Wheelchair 150 feet activity     Assist      Assist Level: Dependent - Patient 0%   Blood pressure (!) 111/52, pulse 66, temperature 98.2 F (36.8 C), temperature source Oral, resp. rate 16, height 5' (1.524 m), weight 58.7 kg, SpO2 100 %.  Medical Problem List and Plan: 1.Functional and mobility deficitssecondary to debility after empyema, thoracotomy and associated deficts -patient maynot yetshower due to JP drains -ELOS/Goals: mod I with PT, OT, SLP---10-14 days  -Continue CIR PT, OT 2. Antithrombotics: -DVT/anticoagulation:Pharmaceutical:Other (comment)--Eliquis -antiplatelet therapy: N/A 3.OA/Pain Management:has been using Celebrex bid for years.Tylenol prn  3/2- having a lot of pain in knees and LEs in general- not asking for additional meds, but might give tramadol prn. Continues to be well controlled.  4. Mood:LCSW to follow for evaluation and support. -antipsychotic agents: N/A 5. Neuropsych: This patientiscapable of making decisions on herown behalf. 6. Skin/Wound Care:Routine pressure relief measures -barrier cream to sacral wounds -JP sites clean and dry  3/4- will have nursing change midline incision.   3/5- drainage improved after staples removed.  7. Fluids/Electrolytes/Nutrition:Monitor I/O. Offer supplements between meals.  8. Empyema s/p drainage:  Colchicine added on 2/26 "to try to get tubes drier"  -both JP's drained about 180cc over last 24 hours  3/2- JPs are >1/2 full for both of them- will con't JP drains per surgery.   3/3- taking out part of staples- appears by CXR that drains are in place.  3/5- JP drains still putting out a lot- 1/2 full with serous  drainage this AM   -continue per surgery team 9. PAF: In NSR on amiodarone. Eliquis on board since 2/24 10 Fluid overload: Monitor weights daily. Continue lasix. -heart failure team following   Filed Weights   07/30/19 0500 07/31/19 0443 08/01/19 0500  Weight: 60.4 kg 61.8 kg 58.7 kg    3/2- weight stable at 60 kg  3/3- weight stable/down slightly 59.2 kg  3/4- Weight 60.8kg- still low- will monitor - not sure added 3-4 lbs in 1 day?  3/5- weight 60.4 kg- improved  3/6: weight increased.   3/7: weight decreased 11. ABLA:  2/28: trending down 10.4-->9.7-->9.4-->8.9-->8.6 --> 8.5 2/28   -requested stool sample for OB  -mild blood loss from drains  Continue to follow serially  -fe++ supp  3/3- Hb back up to 10 last check -labs this AM- not back yet.  12. Severe protein calorie malnutrition:encourage PO, protein supps 13. COPD- oxygen dependent- acute on chronic hypoxic resp failure- since for the past year but did not start using it till Sept.Respiratiory status stable on Dulera and Singulair.   3/1- tolerating 2L Of O2- comfortable  3/2- on home dose of O2- had a little phlegm this AM, but no Sx's of illness otherwise- will con't to monitor closely.   3/5- tolerating 2L O2- sounds good- con't regimen 14. Nausea/Vomiting:Has had stomach issues for year. --continue Protonix--decreased to daily.Celebrex likely contributing to GI distress.  3/4-  N/V resolved completely. Continues to have no additional complaints.   15. IVs- will d/c midline since not using 3/5  LOS: 8 days A FACE TO FACE EVALUATION WAS PERFORMED  Connie Jarvis P Ronan Dion 08/01/2019, 10:18 AM

## 2019-08-02 ENCOUNTER — Inpatient Hospital Stay (HOSPITAL_COMMUNITY): Payer: Medicare Other | Admitting: Occupational Therapy

## 2019-08-02 ENCOUNTER — Inpatient Hospital Stay (HOSPITAL_COMMUNITY): Payer: Medicare Other

## 2019-08-02 LAB — BASIC METABOLIC PANEL WITH GFR
Anion gap: 6 (ref 5–15)
BUN: 15 mg/dL (ref 8–23)
CO2: 33 mmol/L — ABNORMAL HIGH (ref 22–32)
Calcium: 8.4 mg/dL — ABNORMAL LOW (ref 8.9–10.3)
Chloride: 103 mmol/L (ref 98–111)
Creatinine, Ser: 0.53 mg/dL (ref 0.44–1.00)
GFR calc Af Amer: >60 mL/min
GFR calc non Af Amer: >60 mL/min
Glucose, Bld: 85 mg/dL (ref 70–99)
Potassium: 4.1 mmol/L (ref 3.5–5.1)
Sodium: 142 mmol/L (ref 135–145)

## 2019-08-02 MED ORDER — CELECOXIB 200 MG PO CAPS
200.0000 mg | ORAL_CAPSULE | Freq: Every day | ORAL | Status: DC
  Administered 2019-08-03: 200 mg via ORAL
  Filled 2019-08-02: qty 1

## 2019-08-02 MED ORDER — MONTELUKAST SODIUM 10 MG PO TABS
10.0000 mg | ORAL_TABLET | Freq: Every day | ORAL | Status: DC
  Administered 2019-08-03: 10:00:00 10 mg via ORAL
  Filled 2019-08-02: qty 1

## 2019-08-02 NOTE — Progress Notes (Signed)
Occupational Therapy Session Note  Patient Details  Name: Connie Jarvis MRN: [id number] Date of Birth: 12/27/1940  Today's Date: 08/02/2019 OT Individual Time: 3338-3291 OT Individual Time Calculation (min): 29 min   OT Individual Time: 1500-1035 OT Individual Time Calculation (min): 35 min   Short Term Goals: Week 1:  OT Short Term Goal 1 (Week 1): STGs equal to LTGs set at supervision to modified independent overall  Skilled Therapeutic Interventions/Progress Updates:  Session 1: Patient met lying supine in bed in agreement with OT treatment session. Patient reporting 0/10 pain at rest and with activity this date. Supine to EOB with HOB elevated and Mod I with use of R bedrail. Patient able to ambulate with use of rollator to sink in room with good safety awareness. Patient chose to sit on rollator for bathing at sink level with ability to complete UB bathing/dressing with I and LB bathing/dressing with Mod I for increased time. Patient demonstrates good carryover of energy conservation strategies including threading underwear/pants at the same time and taking rest breaks as needed during ADL tasks. Patient able to maintain standing balance with close supervision and use of LUE supported on sink for perineal care. Patient left seated in recliner with call bell within reach.   Session 2: Patient met lying supine in bed in agreement with afternoon OT treatment session with emphasis on community re-integration. Patient reports slight discomfort in L knee but 0/10 pain at rest and with activity. Sit to stand from EOB with supervision A. Patient ambulated ~139f to elevators with 1 seated rest break demonstrating good safety awareness with rollator. OT provided eduction on positioning of rollator against a wall in the elevator to utilize rollator for rest breaks with patient able to return demonstrate. Patient practiced pulling rollator up to a table in the atrium with good return demonstration of  rollator placement. All sit to stands completed with supervision A/Mod I for increased time. Patient returned to room to recliner and left with call bell with in reach, all needs met, and chair alarm activated.    Therapy Documentation Precautions:  Precautions Precautions: Fall Precaution Comments: LUQ bulb drains; chronic O2 via [place] (2L) Restrictions Weight Bearing Restrictions: No RUE Weight Bearing: Non weight bearing   Therapy/Group: Individual Therapy  [name] 08/02/2019, 7:39 AM

## 2019-08-02 NOTE — Progress Notes (Signed)
Physical Therapy Discharge Summary  Patient Details  Name: Connie Jarvis MRN: [id number] Date of Birth: [birth year]  Today's Date: 08/02/2019 PT Individual Time: 1300-1415 PT Individual Time Calculation (min): 75 min    Patient has met 6 of 6 long term goals due to improved activity tolerance, improved balance, improved postural control, increased strength, decreased pain and ability to compensate for deficits.  Patient to discharge at an ambulatory level Modified Independent for household mobility and supervision for community mobility.   Patient's care partner is independent to provide the necessary physical assistance at discharge.  Recommendation:  Patient will benefit from ongoing skilled PT services in home health setting to continue to advance safe functional mobility, address ongoing impairments in balance, strength, activity tolerance, functional mobility, O2 line management, patient/caregiver education, and minimize fall risk.  Equipment: No equipment provided, patient has rollator at home  Reasons for discharge: treatment goals met  Patient/family agrees with progress made and goals achieved: Yes  Skilled Therapeutic Interventions: Patient in recliner with her husband in the room upon PT arrival. Patient alert and agreeable to PT session. Patient reported 2-3/10 B knee pain that progressed to 4-5/10 B knee pain during session, RN made aware. PT provided repositioning, rest breaks, and distraction as pain interventions throughout session.   Therapeutic Activity: Bed Mobility: Patient performed sit to supine independently in a flat bed without use of bed rails.  Transfers: Patient performed sit to/from stand x6 with and without a rollator with mod I. She performed toileting and was continent of bladder during session. Transfers, peri-care, and LB dressing were performed with mod I using a rollator.  Gait Training:  Patient ambulated 130 feet using a rollator with mod I. Ambulated  with decreased gait speed, decreased step length and height, increased B hip and knee flexion in stance, mild forward trunk lean. Patient self-selected an appropriate pace to manage her endurance during ambulation and performed paced breathing during ambulating and pursed lip breathing with cues x1. She ambulated up/down a ramp with supervision for safety using a rollator. Educated patient on waiting to ambulate on her ramp at home until able to try with HHPT due to increased incline and bumps on home ramp. Plan to ride rollator in the seat with one person in front and one person behind to enter home at d/c. Patient in agreement.  Neuromuscular Re-ed: Patient performed the Berg Balance Test: Patient demonstrates increased fall risk as noted by score of  34/56 on Berg Balance Scale.  (<36= high risk for falls, close to 100%; 37-45 significant >80%; 46-51 moderate >50%; 52-55 lower >25%)  Therapeutic Exercise: Patient performed the following exercises, provided HEP handout, with verbal and tactile cues for proper technique. Seated Long Arc Quad - 10 reps - 2 sets - 2x daily - 7x weekly  Seated Hip Adduction Isometrics with Ball - 10 reps - 2 sets - 2x daily - 7x weekly  Seated March - 10 reps - 2 sets - 2x daily - 7x weekly  Seated Heel Toe Raises - 10 reps - 2 sets - 2x daily - 7x weekly  Provided seated exercises for safety, instructed patient to work with HHPT to progress to standing balance and ambulation exercises as tolerated. Also educated on use of functional mobility around the home as exercise, patient stated understanding.   Educated patient on fall risk/prevention, home modifications to prevent falls, and activation of emergency services in the event of a fall and provided CDC fall prevention handout during session. Instructed  patient that she would be able to ambulate at home alone, however, to try all mobility with someone in the room the first time for safety, patient in agreement.  Discussed O2 line management and increased fall risk/tripping hazard. Patient able to state to turn toward the line to prevent tangling, to have the line in her hand for control with mobility, and to look for it on the floor when ambulating.   Patient in bed at end of session with breaks locked, bed alarm set, and all needs within reach.    PT Discharge Precautions/Restrictions Precautions Precautions: None Restrictions Weight Bearing Restrictions: No Other Position/Activity Restrictions: 2 L JP drains Vital Signs Therapy Vitals Temp: 98.4 F (36.9 C) Pulse Rate: 65 Resp: 20 BP: (!) 126/52 Patient Position (if appropriate): Lying Oxygen Therapy SpO2: 100 % O2 Device: Nasal Cannula Pain Pain Assessment Pain Scale: 0-10 Pain Score: 0-No pain Vision/Perception  Perception Perception: Within Functional Limits Praxis Praxis: Intact  Cognition Overall Cognitive Status: Within Functional Limits for tasks assessed Arousal/Alertness: Awake/alert Attention: Focused;Sustained Focused Attention: Appears intact Sustained Attention: Appears intact Memory: Appears intact Awareness: Appears intact Problem Solving: Appears intact Safety/Judgment: Appears intact Sensation Sensation Light Touch: Appears Intact Additional Comments: reports slight tingling in BLE; sensation to LT intact Coordination Gross Motor Movements are Fluid and Coordinated: Yes Fine Motor Movements are Fluid and Coordinated: Yes Heel Shin Test: Smooth within available range, limited by hip flexor weakness Motor  Motor Motor: Other (comment) Motor - Discharge Observations: Decreased activity tolerance and generalized weakness, on 2L/min O2 for all mobility (baseline)  Mobility Bed Mobility Bed Mobility: Rolling Right;Rolling Left;Supine to Sit;Sit to Supine Rolling Right: Independent Rolling Left: Independent Supine to Sit: Independent Sit to Supine: Independent Transfers Transfers: Sit to Stand;Stand  to Sit;Stand Pivot Transfers Sit to Stand: Independent with assistive device Stand to Sit: Independent with assistive device Stand Pivot Transfers: Independent with assistive device Transfer (Assistive device): Rollator Locomotion  Gait Ambulation: Yes Gait Assistance: Independent with assistive device Assistive device: Rollator Gait Gait: Yes Gait Pattern: Step-through pattern;Decreased stride length;Decreased hip/knee flexion - right;Decreased hip/knee flexion - left;Decreased trunk rotation;Narrow base of support Gait velocity: decreased Stairs / Additional Locomotion Stairs: No(Patient did not perform stairs at home, 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 stair training due to B knee OA) Ramp: Supervision/Verbal cueing(with rollator) 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, petroleum)  Trunk/Postural Assessment  Cervical Assessment Cervical Assessment: Exceptions to WFL(cervical protraction) Thoracic Assessment Thoracic Assessment: Exceptions to WFL(slight thoracic rounding) Lumbar Assessment Lumbar Assessment: Exceptions to WFL(posterior pelvic tilt) Postural Control Postural Control: Deficits on evaluation(decreased/dalayed)  Balance Standardized Balance Assessment Standardized Balance Assessment: Berg Balance Test Berg Balance Test Sit to Stand: Able to stand  independently using hands Standing Unsupported: Able to stand safely 2 minutes Sitting with Back Unsupported but Feet Supported on Floor or Stool: Able to sit safely and securely 2 minutes Stand to Sit: Sits safely with minimal use of hands Transfers: Able to transfer safely, definite need of hands Standing Unsupported with Eyes Closed: Able to stand 10 seconds safely Standing Ubsupported with Feet Together: Able to place feet together independently and stand for 1 minute with supervision From Standing, Reach Forward with Outstretched Arm: Can reach forward >5 cm safely (2") From Standing Position, Pick up  Object from Floor: Able to pick up shoe, needs supervision From Standing Position, Turn to Look Behind Over each Shoulder: Turn sideways only but maintains balance Turn 360 Degrees: Needs assistance while turning Standing Unsupported, Alternately Place Feet on  Step/Stool: Needs assistance to keep from falling or unable to try Standing Unsupported, One Foot in Front: Able to take small step independently and hold 30 seconds Standing on One Leg: Unable to try or needs assist to prevent fall Total Score: 34 Static Sitting Balance Static Sitting - Balance Support: No upper extremity supported;Feet supported Static Sitting - Level of Assistance: 7: Independent Dynamic Sitting Balance Dynamic Sitting - Balance Support: No upper extremity supported;During functional activity;Feet supported Dynamic Sitting - Level of Assistance: 7: Independent Static Standing Balance Static Standing - Balance Support: No upper extremity supported;During functional activity Static Standing - Level of Assistance: 6: Modified independent (Device/Increase time) Dynamic Standing Balance Dynamic Standing - Balance Support: Right upper extremity supported;Left upper extremity supported;During functional activity Dynamic Standing - Level of Assistance: 6: Modified independent (Device/Increase time) Extremity Assessment  RUE Assessment RUE Assessment: Within Functional Limits LUE Assessment LUE Assessment: Within Functional Limits RLE Assessment RLE Assessment: Exceptions to [facility] Active Range of Motion (AROM) Comments: pt reports toes often curl under when putting on shoes, otherwise [facility] General Strength Comments: grossly 5/5 throughout, except hip flexion and PF 4/5(mild edema) LLE Assessment LLE Assessment: Exceptions to [facility] Active Range of Motion (AROM) Comments: WFL for all functional mobility General Strength Comments: Grossly 4+/5 throughout(mild edema)    Giavonni Cizek L Dalin Caldera PT, DPT  08/02/2019, 4:25 PM

## 2019-08-02 NOTE — Progress Notes (Signed)
Occupational Therapy Discharge Summary  Patient Details  Name: Connie Jarvis MRN: [id number] Date of Birth: 10/21/1940  Today's Date: 08/02/2019 OT Individual Time: 1058-1156 OT Individual Time Calculation (min): 58 min   Patient alert and ready for therapy session today.  O2 via [place] 2L.  She demonstrates good recall of strategies presented over the course of her rehab stay and states that she is ready to go home tomorrow.  She is able to donn socks and shoes independently.  Completed ambulation with rollator mod I/DS.   Tolerated unsupported sitting on mat for 30 minutes for seated trunk, UB and LB conditioning activities.  Returned to recliner at close of session, call bell and tray table in reach.     Patient has met 12 of 12 long term goals due to improved activity tolerance and improved balance.  Patient to discharge at overall Modified Independent level.  Patient's care partner is independent to provide the necessary physical assistance at discharge.    Reasons goals not met: na  Recommendation:  Patient will benefit from ongoing skilled OT services in home health setting to continue to advance functional skills in the area of BADL and iADL.  Equipment: tub transfer bench  Reasons for discharge: treatment goals met  Patient/family agrees with progress made and goals achieved: Yes  OT Discharge Precautions/Restrictions  Precautions Precautions: None Precaution Comments: LUQ bulb drains; chronic O2 via  (2L) Restrictions Weight Bearing Restrictions: No Other Position/Activity Restrictions: 2 L JP drains General   Vital Signs Therapy Vitals Pulse Rate: 64 BP: (!) 138/57 Patient Position (if appropriate): Sitting Pain Pain Assessment Pain Scale: 0-10 Pain Score: 0-No pain ADL ADL Eating: Independent Where Assessed-Eating: Chair Grooming: Independent Where Assessed-Grooming: Chair Upper Body Bathing: Independent Where Assessed-Upper Body Bathing: Sitting at sink,  Chair Lower Body Bathing: Setup, Supervision/safety Where Assessed-Lower Body Bathing: Sitting at sink, Standing at sink, Chair Upper Body Dressing: Independent Where Assessed-Upper Body Dressing: Chair Lower Body Dressing: Independent Where Assessed-Lower Body Dressing: Chair Toileting: Modified independent Where Assessed-Toileting: Toilet Toilet Transfer: Modified independent Toilet Transfer Method: Ambulating Toilet Transfer Equipment: Bedside commode, Grab bars Tub/Shower Transfer: Close supervison Tub/Shower Transfer Method: Ambulating Tub/Shower Equipment: Transfer tub bench Vision Baseline Vision/History: Wears glasses Wears Glasses: Reading only Patient Visual Report: No change from baseline Vision Assessment?: No apparent visual deficits Perception  Perception: Within Functional Limits Praxis Praxis: Intact Cognition Overall Cognitive Status: Within Functional Limits for tasks assessed Arousal/Alertness: Awake/alert Orientation Level: Oriented X4 Safety/Judgment: Appears intact Sensation Sensation Additional Comments: occ tingling bilateral UEs at times Coordination Fine Motor Movements are Fluid and Coordinated: Yes Motor  Motor Motor - Discharge Observations: Decreased activity tolerance and generalized weakness, on 2L/min O2 for all mobility (baseline) Mobility  Bed Mobility Supine to Sit: Independent Sit to Supine: Independent Transfers Sit to Stand: Independent with assistive device Stand to Sit: Independent with assistive device     Balance Static Sitting Balance Static Sitting - Level of Assistance: 7: Independent Dynamic Sitting Balance Dynamic Sitting - Level of Assistance: 7: Independent Static Standing Balance Static Standing - Level of Assistance: 6: Modified independent (Device/Increase time) Dynamic Standing Balance Dynamic Standing - Level of Assistance: 6: Modified independent (Device/Increase time) Extremity/Trunk Assessment RUE  Assessment RUE Assessment: Within Functional Limits LUE Assessment LUE Assessment: Within Functional Limits   Stacey A Kinter 08/02/2019, 12:31 PM 

## 2019-08-02 NOTE — Progress Notes (Signed)
[facility] PHYSICAL MEDICINE & REHABILITATION PROGRESS NOTE   Subjective/Complaints:  Pt reports exciting that's being discharged tomorrow. Bowels working well/daily.   Wants to make sure  Has f/u with surgery due to JP drains.   ROS: Pt denies SOB, CP, N/V/C/D.    Objective:   No results found. No results for input(s): WBC, HGB, HCT, PLT in the last 72 hours. Recent Labs    08/02/19 0600  NA 142  K 4.1  CL 103  CO2 33*  GLUCOSE 85  BUN 15  CREATININE 0.53  CALCIUM 8.4*    Intake/Output Summary (Last 24 hours) at 08/02/2019 0917 Last data filed at 08/02/2019 0838 Gross per 24 hour  Intake 360 ml  Output 60 ml  Net 300 ml     Physical Exam: Vital Signs Blood pressure 131/60, pulse 68, temperature 97.9 F (36.6 C), resp. rate 16, height 5' (1.524 m), weight 58.6 kg, SpO2 92 %. Constitutional: pt awake, sitting up in bed; face appears a little fuller,  NAD HEENT: O2 by [place] 2L; face fuller Neck: supple Cardiovascular: RRR Respiratory/Chest: CTA B/L- J drains at least 1/3 full - were emptied this AM GI: soft, NT, ND, (+)BS; midline incision looks better- less drainage. Ext: no clubbing, cyanosis, or edema Musculoskeletal:  General:1+ edema to shins B/L Cervical back: Normal range of motion.  Neurological: Alert oriented x3 Dysphonia noted--mild. More prominent when she fatigues. Otherwise CN intact. UE motor 4/5 proximal to distal. LE: 3/5 HF, KE and 2-3/5 ADF/PF. Sensed pain and light touch in both feet. Skin:  Dry; but not superdry Still 2 spots on sacrum Stage II    Assessment/Plan: 1. Functional deficits secondary to debility which require 3+ hours per day of interdisciplinary therapy in a comprehensive inpatient rehab setting.  Physiatrist is providing close team supervision and 24 hour management of active medical problems listed below.  Physiatrist and rehab team continue to assess barriers to discharge/monitor patient progress toward  functional and medical goals  Care Tool:  Bathing  Bathing activity did not occur: Refused Body parts bathed by patient: Right arm, Left arm, Chest, Abdomen, Front perineal area, Buttocks, Right upper leg, Left upper leg, Right lower leg, Left lower leg, Face         Bathing assist Assist Level: Minimal Assistance - Patient > 75%     Upper Body Dressing/Undressing Upper body dressing   What is the patient wearing?: Pull over shirt, Button up shirt    Upper body assist Assist Level: Supervision/Verbal cueing    Lower Body Dressing/Undressing Lower body dressing      What is the patient wearing?: Underwear/pull up, Pants     Lower body assist Assist for lower body dressing: Contact Guard/Touching assist     Toileting Toileting    Toileting assist Assist for toileting: Supervision/Verbal cueing     Transfers Chair/bed transfer  Transfers assist  Chair/bed transfer activity did not occur: N/A  Chair/bed transfer assist level: Supervision/Verbal cueing Chair/bed transfer assistive device: Programmer, multimedia   Ambulation assist   Ambulation activity did not occur: N/A  Assist level: Supervision/Verbal cueing Assistive device: Rollator Max distance: 143ft   Walk 10 feet activity   Assist     Assist level: Supervision/Verbal cueing Assistive device: Rollator   Walk 50 feet activity   Assist    Assist level: Supervision/Verbal cueing Assistive device: Rollator    Walk 150 feet activity   Assist Walk 150 feet activity did not occur: Safety/medical concerns(endurance)  Assist level: Supervision/Verbal cueing Assistive device: Rollator    Walk 10 feet on uneven surface  activity   Assist     Assist level: Minimal Assistance - Patient > 75% Assistive device: Rollator   Wheelchair     Assist Will patient use wheelchair at discharge?: Yes Type of Wheelchair: Manual Wheelchair activity did not occur: N/A  Wheelchair  assist level: Dependent - Patient 0%      Wheelchair 50 feet with 2 turns activity    Assist        Assist Level: Dependent - Patient 0%   Wheelchair 150 feet activity     Assist      Assist Level: Dependent - Patient 0%   Blood pressure 131/60, pulse 68, temperature 97.9 F (36.6 C), resp. rate 16, height 5' (1.524 m), weight 58.6 kg, SpO2 92 %.  Medical Problem List and Plan: 1.Functional and mobility deficitssecondary to debility after empyema, thoracotomy and associated deficts -patient maynot yetshower due to JP drains -ELOS/Goals: mod I with PT, OT, SLP---10-14 days  -Continue CIR PT, OT 2. Antithrombotics: -DVT/anticoagulation:Pharmaceutical:Other (comment)--Eliquis -antiplatelet therapy: N/A 3.OA/Pain Management:has been using Celebrex bid for years.Tylenol prn  3/2- having a lot of pain in knees and LEs in general- not asking for additional meds, but might give tramadol prn. Continues to be well controlled.  4. Mood:LCSW to follow for evaluation and support. -antipsychotic agents: N/A 5. Neuropsych: This patientiscapable of making decisions on herown behalf. 6. Skin/Wound Care:Routine pressure relief measures -barrier cream to sacral wounds -JP sites clean and dry  3/4- will have nursing change midline incision.   3/5- drainage improved after staples removed.   3/8- con't JP drains- need to f/u with surgery.  7. Fluids/Electrolytes/Nutrition:Monitor I/O. Offer supplements between meals.  8. Empyema s/p drainage:  Colchicine added on 2/26 "to try to get tubes drier"  -both JP's drained about 180cc over last 24 hours  3/2- JPs are >1/2 full for both of them- will con't JP drains per surgery.   3/3- taking out part of staples- appears by CXR that drains are in place.  3/5- JP drains still putting out a lot- 1/2 full with serous drainage this AM   3/8- JP drains  continue- will d/c with them.  -continue per surgery team 9. PAF: In NSR on amiodarone. Eliquis on board since 2/24 10 Fluid overload: Monitor weights daily. Continue lasix. -heart failure team following   Filed Weights   07/31/19 0443 08/01/19 0500 08/02/19 0440  Weight: 61.8 kg 58.7 kg 58.6 kg    3/2- weight stable at 60 kg  3/3- weight stable/down slightly 59.2 kg  3/4- Weight 60.8kg- still low- will monitor - not sure added 3-4 lbs in 1 day?  3/5- weight 60.4 kg- improved  3/6: weight increased.   3/7: weight decreased 11. ABLA:  2/28: trending down 10.4-->9.7-->9.4-->8.9-->8.6 --> 8.5 2/28   -requested stool sample for OB  -mild blood loss from drains  Continue to follow serially  -fe++ supp  3/3- Hb back up to 10 last check -labs this AM- not back yet.  12. Severe protein calorie malnutrition:encourage PO, protein supps 13. COPD- oxygen dependent- acute on chronic hypoxic resp failure- since for the past year but did not start using it till Sept.Respiratiory status stable on Dulera and Singulair.   3/1- tolerating 2L Of O2- comfortable  3/2- on home dose of O2- had a little phlegm this AM, but no Sx's of illness otherwise- will con't to monitor closely.  3/5- tolerating 2L O2- sounds good- con't regimen  3/8- tolerating O2 and breathing well.  14. Nausea/Vomiting:Has had stomach issues for year. --continue Protonix--decreased to daily.Celebrex likely contributing to GI distress.  3/4- N/V resolved completely. Continues to have no additional complaints.   15. IVs- will d/c midline since not using 3/5 16. Dispo  3/8- f/u with Dr Dagoberto Ligas- d/c tomorrow  LOS: 9 days A FACE TO FACE EVALUATION WAS PERFORMED  Yatzil Clippinger 08/02/2019, 9:17 AM

## 2019-08-02 NOTE — Plan of Care (Signed)
  Problem: Consults Goal: RH GENERAL PATIENT EDUCATION Description: See Patient Education module for education specifics. Outcome: Progressing   Problem: RH SKIN INTEGRITY Goal: RH STG SKIN FREE OF INFECTION/BREAKDOWN Description: Skin remain free of further breakdown with mod assist Outcome: Progressing Goal: RH STG ABLE TO PERFORM INCISION/WOUND CARE W/ASSISTANCE Description: STG Able To Perform Incision/Wound Care With Assistance. Mod Outcome: Progressing   Problem: RH SKIN INTEGRITY Goal: RH STG ABLE TO PERFORM INCISION/WOUND CARE W/ASSISTANCE Description: STG Able To Perform Incision/Wound Care With Assistance. Mod Outcome: Progressing

## 2019-08-02 NOTE — Progress Notes (Signed)
Social Work Patient ID: [name], female   DOB: Jul 13, 1940, 79 y.o.   MRN: [id number]    SW followed up with [facility] (p:[phone]/f: [phone]) about referral and if received.  *SW confirms that referral was received.  SW met with pt in room to provide above updates. Still waiting on TTB to be delivered to pt room.   Loralee Pacas, MSW, [place] Office: [phone] Cell: [phone] Fax: [phone]

## 2019-08-03 DIAGNOSIS — D62 Acute posthemorrhagic anemia: Secondary | ICD-10-CM

## 2019-08-03 MED ORDER — FE FUMARATE-B12-VIT C-FA-IFC PO CAPS: 1.0000 | ORAL_CAPSULE | Freq: Two times a day (BID) | ORAL | 0 refills | Status: DC

## 2019-08-03 MED ORDER — FUROSEMIDE 20 MG PO TABS: 20.0000 mg | ORAL_TABLET | Freq: Every day | ORAL | 0 refills | Status: DC

## 2019-08-03 MED ORDER — AMIODARONE HCL 200 MG PO TABS: 200.0000 mg | ORAL_TABLET | Freq: Two times a day (BID) | ORAL | 0 refills | Status: AC

## 2019-08-03 MED ORDER — MOMETASONE FURO-FORMOTEROL FUM 100-5 MCG/ACT IN AERO: 2.0000 | INHALATION_SPRAY | Freq: Two times a day (BID) | RESPIRATORY_TRACT | Status: DC

## 2019-08-03 MED ORDER — CELECOXIB 200 MG PO CAPS: 200.0000 mg | ORAL_CAPSULE | Freq: Every day | ORAL | 3 refills | Status: AC

## 2019-08-03 MED ORDER — APIXABAN 5 MG PO TABS: 5.0000 mg | ORAL_TABLET | Freq: Two times a day (BID) | ORAL | 0 refills | Status: DC

## 2019-08-03 MED ORDER — COLCHICINE 0.6 MG PO TABS: 0.3000 mg | ORAL_TABLET | Freq: Every day | ORAL | 0 refills | Status: DC

## 2019-08-03 MED ORDER — TRAZODONE HCL 50 MG PO TABS: 25.0000 mg | ORAL_TABLET | Freq: Every evening | ORAL | 0 refills | Status: DC | PRN

## 2019-08-03 MED ORDER — MONTELUKAST SODIUM 10 MG PO TABS: 10.0000 mg | ORAL_TABLET | Freq: Every day | ORAL | Status: AC

## 2019-08-03 MED ORDER — PANTOPRAZOLE SODIUM 40 MG PO TBEC: 40.0000 mg | DELAYED_RELEASE_TABLET | Freq: Every day | ORAL | 0 refills | Status: DC

## 2019-08-03 NOTE — Progress Notes (Signed)
[facility] PHYSICAL MEDICINE & REHABILITATION PROGRESS NOTE   Subjective/Complaints:  Pt reports staples need to be removed- going home with drains.     ROS: Pt denies SOB, CP, N/V/C/D.    Objective:   No results found. No results for input(s): WBC, HGB, HCT, PLT in the last 72 hours. Recent Labs    08/02/19 0600  NA 142  K 4.1  CL 103  CO2 33*  GLUCOSE 85  BUN 15  CREATININE 0.53  CALCIUM 8.4*    Intake/Output Summary (Last 24 hours) at 08/03/2019 0845 Last data filed at 08/03/2019 0837 Gross per 24 hour  Intake 360 ml  Output 145 ml  Net 215 ml     Physical Exam: Vital Signs Blood pressure (!) 123/46, pulse 61, temperature 97.9 F (36.6 C), resp. rate 17, height 5' (1.524 m), weight 61.5 kg, SpO2 98 %. Constitutional: pt awake, sitting up in bed; wearing O2 by [place], NAD HEENT: O2 by [place] 2L; face fuller Neck: supple Cardiovascular: RRR Respiratory/Chest: CTA B/L- J drains at least 1/3 full - were emptied this AM GI: soft, NT, ND, (+)BS; midline incision- no drainage/leakage- staples in place on midline and L abd- JP drains have ~ 7-10 cc in them-  Ext: no clubbing, cyanosis, or edema Musculoskeletal:  General:1+ edema to shins B/L Cervical back: Normal range of motion.  Neurological: Alert oriented x3 Dysphonia noted--mild. More prominent when she fatigues. Otherwise CN intact. UE motor 4/5 proximal to distal. LE: 3/5 HF, KE and 2-3/5 ADF/PF. Sensed pain and light touch in both feet. Skin:  Dry; but not superdry Still 2 spots on sacrum Stage II    Assessment/Plan: 1. Functional deficits secondary to debility which require 3+ hours per day of interdisciplinary therapy in a comprehensive inpatient rehab setting.  Physiatrist is providing close team supervision and 24 hour management of active medical problems listed below.  Physiatrist and rehab team continue to assess barriers to discharge/monitor patient progress toward functional and medical  goals  Care Tool:  Bathing  Bathing activity did not occur: Refused Body parts bathed by patient: Right arm, Left arm, Chest, Abdomen, Front perineal area, Buttocks, Right upper leg, Left upper leg, Right lower leg, Left lower leg, Face         Bathing assist Assist Level: Supervision/Verbal cueing     Upper Body Dressing/Undressing Upper body dressing   What is the patient wearing?: Pull over shirt, Button up shirt    Upper body assist Assist Level: Independent    Lower Body Dressing/Undressing Lower body dressing      What is the patient wearing?: Underwear/pull up, Pants     Lower body assist Assist for lower body dressing: Independent with assitive device     Toileting Toileting    Toileting assist Assist for toileting: Independent with assistive device     Transfers Chair/bed transfer  Transfers assist  Chair/bed transfer activity did not occur: N/A  Chair/bed transfer assist level: Independent with assistive device Chair/bed transfer assistive device: Other(rollator)   Locomotion Ambulation   Ambulation assist   Ambulation activity did not occur: N/A  Assist level: Independent with assistive device Assistive device: Rollator Max distance: >100'   Walk 10 feet activity   Assist     Assist level: Independent with assistive device Assistive device: Rollator   Walk 50 feet activity   Assist    Assist level: Independent with assistive device Assistive device: Rollator    Walk 150 feet activity   Assist Walk  150 feet activity did not occur: Safety/medical concerns(endurance)  Assist level: Supervision/Verbal cueing Assistive device: Rollator    Walk 10 feet on uneven surface  activity   Assist     Assist level: Supervision/Verbal cueing(ramp only) Assistive device: Rollator   Wheelchair     Assist Will patient use wheelchair at discharge?: No(Patient is a functional ambulator) Type of Wheelchair: Manual Wheelchair  activity did not occur: N/A  Wheelchair assist level: Dependent - Patient 0%      Wheelchair 50 feet with 2 turns activity    Assist    Wheelchair 50 feet with 2 turns activity did not occur: N/A   Assist Level: Dependent - Patient 0%   Wheelchair 150 feet activity     Assist  Wheelchair 150 feet activity did not occur: N/A   Assist Level: Dependent - Patient 0%   Blood pressure (!) 123/46, pulse 61, temperature 97.9 F (36.6 C), resp. rate 17, height 5' (1.524 m), weight 61.5 kg, SpO2 98 %.  Medical Problem List and Plan: 1.Functional and mobility deficitssecondary to debility after empyema, thoracotomy and associated deficts -patient maynot yetshower due to JP drains -ELOS/Goals: mod I with PT, OT, SLP---10-14 days  -Continue CIR PT, OT 2. Antithrombotics: -DVT/anticoagulation:Pharmaceutical:Other (comment)--Eliquis -antiplatelet therapy: N/A 3.OA/Pain Management:has been using Celebrex bid for years.Tylenol prn  3/2- having a lot of pain in knees and LEs in general- not asking for additional meds, but might give tramadol prn. Continues to be well controlled.  4. Mood:LCSW to follow for evaluation and support. -antipsychotic agents: N/A 5. Neuropsych: This patientiscapable of making decisions on herown behalf. 6. Skin/Wound Care:Routine pressure relief measures -barrier cream to sacral wounds -JP sites clean and dry  3/4- will have nursing change midline incision.   3/5- drainage improved after staples removed.   3/8- con't JP drains- need to f/u with surgery.  3/9- remove staples and have pt f/u with surgery for JP drains  7. Fluids/Electrolytes/Nutrition:Monitor I/O. Offer supplements between meals.  8. Empyema s/p drainage:  Colchicine added on 2/26 "to try to get tubes drier"  -both JP's drained about 180cc over last 24 hours  3/2- JPs are >1/2 full for both of  them- will con't JP drains per surgery.   3/3- taking out part of staples- appears by CXR that drains are in place.  3/5- JP drains still putting out a lot- 1/2 full with serous drainage this AM   3/8- JP drains continue- will d/c with them.  -continue per surgery team 9. PAF: In NSR on amiodarone. Eliquis on board since 2/24 10 Fluid overload: Monitor weights daily. Continue lasix. -heart failure team following   Filed Weights   08/01/19 0500 08/02/19 0440 08/03/19 0505  Weight: 58.7 kg 58.6 kg 61.5 kg    3/2- weight stable at 60 kg  3/3- weight stable/down slightly 59.2 kg  3/4- Weight 60.8kg- still low- will monitor - not sure added 3-4 lbs in 1 day?  3/5- weight 60.4 kg- improved  3/6: weight increased.   3/7: weight decreased 11. ABLA:  2/28: trending down 10.4-->9.7-->9.4-->8.9-->8.6 --> 8.5 2/28   -requested stool sample for OB  -mild blood loss from drains  Continue to follow serially  -fe++ supp  3/3- Hb back up to 10 last check -labs this AM- not back yet.  12. Severe protein calorie malnutrition:encourage PO, protein supps 13. COPD- oxygen dependent- acute on chronic hypoxic resp failure- since for the past year but did not start using it till Sept.Respiratiory status  stable on Dulera and Singulair.   3/1- tolerating 2L Of O2- comfortable  3/2- on home dose of O2- had a little phlegm this AM, but no Sx's of illness otherwise- will con't to monitor closely.   3/5- tolerating 2L O2- sounds good- con't regimen  3/8- tolerating O2 and breathing well.  14. Nausea/Vomiting:Has had stomach issues for year. --continue Protonix--decreased to daily.Celebrex likely contributing to GI distress.  3/4- N/V resolved completely. Continues to have no additional complaints.   15. IVs- will d/c midline since not using 3/5 16. Dispo  3/8- f/u with Dr Dagoberto Ligas- d/c tomorrow  LOS: 10 days A FACE TO FACE EVALUATION WAS PERFORMED  Bronislaus Verdell 08/03/2019, 8:45 AM

## 2019-08-03 NOTE — Progress Notes (Signed)
Social Work Discharge Note   The overall goal for the admission was met for:   Discharge location: Yes. Pt discharged to home with her husband.  Length of Stay: Yes. 10 days  Discharge activity level: Yes. Mod I  Home/community participation: Yes. Limited  Services provided included: MD, RD, PT, OT, SLP, RN, CM, TR, Pharmacy, Neuropsych and SW  Financial Services: Medicare and Private Insurance: Medicare A/B and [employer]  Follow-up services arranged: Home Health: [facility] for PT/OT/SN #[phone], DME: [facility] for TTB #[phone] and Patient/Family request agency HH: [facility], DME: N/A  Comments (or additional information):contact pt# [phone]  Patient/Family verbalized understanding of follow-up arrangements: Yes  Individual responsible for coordination of the follow-up plan: Pt and pt husband will coordinate her care needs.   Confirmed correct DME delivered: Connie Jarvis 08/03/2019    Connie Jarvis 

## 2019-08-03 NOTE — Discharge Summary (Signed)
Physician Discharge Summary  Patient ID: Connie Jarvis MRN: [id number] DOB/AGE: [birth year] 79 y.o.  Admit date: 07/24/2019 Discharge date: 08/03/2019  Discharge Diagnoses:  Principal Problem:   Debility Active Problems:   Pulmonary hypertension ([place])   COPD with hypoxia (HCC)   Atrial fibrillation, chronic (HCC)   Malnutrition of moderate degree   Acute blood loss anemia   Discharged Condition:  Stable   Significant Diagnostic Studies:  DG Chest 2 View  Result Date: 07/27/2019 CLINICAL DATA:  Patient status post placement of a left pleural drainage catheter 1 week ago. EXAM: CHEST - 2 VIEW COMPARISON:  PA and lateral chest 07/20/2019. FINDINGS: Left IJ catheter has been removed. Left pleural drainage catheter is unchanged in position. Small left effusion and basilar atelectasis noted. No pneumothorax. Trace right pleural effusion. Right lung is clear. Heart size upper normal. IMPRESSION: Left pleural drainage catheter is unchanged. Small left pleural effusion and basilar atelectasis are also unchanged. No pneumothorax. Status post right IJ catheter removal. Trace right pleural effusion. Electronically Signed   By: Inge Rise M.D.   On: 07/27/2019 15:54     Labs:  Basic Metabolic Panel: BMP Latest Ref Rng & Units 08/02/2019 07/26/2019 07/25/2019  Glucose 70 - 99 mg/dL 85 101(H) 97  BUN 8 - 23 mg/dL 15 12 14   Creatinine 0.44 - 1.00 mg/dL 0.53 0.54 0.60  Sodium 135 - 145 mmol/L 142 139 139  Potassium 3.5 - 5.1 mmol/L 4.1 4.4 4.1  Chloride 98 - 111 mmol/L 103 101 100  CO2 22 - 32 mmol/L 33(H) 32 33(H)  Calcium 8.9 - 10.3 mg/dL 8.4(L) 8.7(L) 8.3(L)    CBC: CBC Latest Ref Rng & Units 07/28/2019 07/26/2019 07/25/2019  WBC 4.0 - 10.5 K/uL 6.7 8.5 7.2  Hemoglobin 12.0 - 15.0 g/dL 9.1(L) 10.0(L) 8.5(L)  Hematocrit 36.0 - 46.0 % 29.4(L) 32.2(L) 27.9(L)  Platelets 150 - 400 K/uL 270 329 315    CBG: No results for input(s): GLUCAP in the last 168 hours.  Brief HPI:   Connie Jarvis is a 79  y.o. female with history of COPD, depression, HTN, left chest rib lesion s/p I&D was admitted on 07/07/2019 with large empyema with compressive atelectasis.  She underwent left and right catheterization revealing mild PAH prior to being taken to the OR the same day for major thoracotomy with drainage of empyema and application of wound VAC by Dr. Julien Girt.  Postop course significant for issues with volume overload, problems with N/V,  A. fib with RVR as well as wound infection requiring repeat I&D and ultimately greater omental flap closure on 02/06 with placement of right chest tubes.  She was treated with vancomycin and Zosyn through 2/20 for wound infection and Eliquis resumed. Symptoms were improving but patient was noted to be debilitated therefore CIR recommended for follow up therapy   Hospital Course: Connie Jarvis was admitted to rehab 07/24/2019 for inpatient therapies to consist of PT and OT at least three hours five days a week. Past admission physiatrist, therapy team and rehab RN have worked together to provide customized collaborative inpatient rehab.  She has made steady progress during her rehab stay with improvement in her respiratory status and endurance has improved.  Mild dysphonia has improved and is more prominent when fatigue.  Weights have been monitored with serial checks and she continues on Lasix.  Heart rate is stable and in NSR on amiodarone.  Serial CBC shows acute blood loss anemia which is overall stable.  Check of  BMET showed electrolytes showed electrolytes and renal status to be within normal limits.  Her left chest drains insertion site are clean and dry.  Drains were pulled briefly during therapy however follow-up exam and x-ray shows drains to be in place.  CT surgery has been following for input and patient discharged to home with drains which had to be emptied daily. Patient's left lateral chest incision is healing well and staples were DC'd prior to discharge.  Mid abdominal  incision and has minimal serosanguineous drainage due to removal of partial staples.  Dry dressing recommended for now.  She has made steady progress during her stay and is currently at modified independent level.  She will continue to receive further follow-up home health PT, OT and RN by rent of home health after discharge   Rehab course: During patient's stay in rehab weekly team conferences were held to monitor patient's progress, set goals and discuss barriers to discharge. At admission, patient required min assist with mobility and with ADL tasks. She  has had improvement in activity tolerance, balance, postural control as well as ability to compensate for deficits.  She is able to complete ADL tasks at modified independent level.  She is modified independent for transfers and is able to ambulate 130 feet using a Rollator.  Family education was completed with husband regarding all aspects of safety and care    Disposition: Home  Diet: Heart healthy  Special Instructions: 1. Empty drains daily. 2. Paint midline wound with betadine daily. Apply dry dressing till drainage resolved.    Discharge Instructions    Ambulatory referral to Physical Medicine Rehab   Complete by: As directed    1-2 weeks TC appointment     Allergies as of 08/03/2019      Reactions   Codeine Nausea And Vomiting   Gluten Meal Diarrhea   Abdominal pain and bloating   Lactose Diarrhea   Bloating and abdominal pain   Neosporin [neomycin-bacitracin Zn-polymyx]    Other    Pain medication pt. Cannot remember the name.      Medication List    STOP taking these medications   feeding supplement (ENSURE ENLIVE) Liqd   ondansetron 4 MG/2ML Soln injection Commonly known as: ZOFRAN   promethazine 25 MG/ML injection Commonly known as: PHENERGAN     TAKE these medications   acetaminophen 500 MG tablet Commonly known as: TYLENOL Take 500 mg by mouth every 6 (six) hours as needed for mild pain, fever or  headache.   amiodarone 200 MG tablet Commonly known as: PACERONE Take 1 tablet (200 mg total) by mouth 2 (two) times daily.   apixaban 5 MG Tabs tablet Commonly known as: ELIQUIS Take 1 tablet (5 mg total) by mouth 2 (two) times daily.   bisacodyl 5 MG EC tablet Commonly known as: DULCOLAX Take 2 tablets (10 mg total) by mouth daily as needed for moderate constipation.   celecoxib 200 MG capsule Commonly known as: CELEBREX Take 1 capsule (200 mg total) by mouth daily. What changed: when to take this   colchicine 0.6 MG tablet Take 0.5 tablets (0.3 mg total) by mouth daily.   ferrous Q000111Q C-folic acid capsule Commonly known as: TRINSICON / FOLTRIN Take 1 capsule by mouth 2 (two) times daily after a meal.   furosemide 20 MG tablet Commonly known as: LASIX Take 1 tablet (20 mg total) by mouth daily.   Gerhardt's butt cream Crea Apply 1 application topically 2 (two) times daily.   levalbuterol  0.63 MG/3ML nebulizer solution Commonly known as: XOPENEX Take 3 mLs (0.63 mg total) by nebulization every 6 (six) hours as needed for wheezing or shortness of breath.   loperamide HCl 1 MG/7.5ML suspension Commonly known as: IMODIUM Take 7.5 mLs (1 mg total) by mouth as needed for diarrhea or loose stools.   mometasone-formoterol 100-5 MCG/ACT Aero Commonly known as: DULERA Inhale 2 puffs into the lungs 2 (two) times daily. Once this is used up ---you can resume symbicort What changed: additional instructions   montelukast 10 MG tablet Commonly known as: SINGULAIR Take 1 tablet (10 mg total) by mouth daily. What changed: how to take this   multivitamin with minerals Tabs tablet Take 1 tablet by mouth daily.   pantoprazole 40 MG tablet Commonly known as: PROTONIX Take 1 tablet (40 mg total) by mouth daily.   traZODone 50 MG tablet Commonly known as: DESYREL Take 0.5 tablets (25 mg total) by mouth at bedtime as needed for sleep.      Follow-up  Information    Lovorn, Jinny Blossom, MD Follow up.   Specialty: Physical Medicine and Rehabilitation Why: Office will call you with follow up appointment Contact information: [id number] [place] [address] [phone]        Wonda Olds, MD Follow up on 08/09/2019.   Specialty: Cardiothoracic Surgery Why: Appointment at 1 pm Contact information: [address] [place] [address] [id number]        Ronita Hipps, MD. Call on 08/04/2019.   Specialty: Family Medicine Why: for post hospital follow up Contact information: [place], Shade Flood., MD Follow up.   Specialty: Cardiology Why: Call for follow up appointment -- post op A fib Contact information: [address] [place] [address] [phone]           Signed: Bary Leriche 08/05/2019, 12:44 PM

## 2019-08-03 NOTE — Progress Notes (Signed)
Patient discharged to home with husband. Patient transported to personal vehicle via wheelchair by nurse  Tech,. Denied pain. Patient able to demonstrate emptying of JP drains.

## 2019-08-03 NOTE — Plan of Care (Signed)
  Problem: Consults Goal: RH GENERAL PATIENT EDUCATION Description: See Patient Education module for education specifics. Outcome: Completed/Met   Problem: RH SKIN INTEGRITY Goal: RH STG SKIN FREE OF INFECTION/BREAKDOWN Description: Skin remain free of further breakdown with mod assist Outcome: Completed/Met Goal: RH STG ABLE TO PERFORM INCISION/WOUND CARE W/ASSISTANCE Description: STG Able To Perform Incision/Wound Care With Assistance. Mod Outcome: Completed/Met

## 2019-08-03 NOTE — Progress Notes (Signed)
Social Work Patient ID: Connie Jarvis, female   DOB: February 15, 1941, 79 y.o.   MRN: [id number]    SW met with pt in room to review discharge. SW confirms DME: TTB in room.   Loralee Pacas, MSW, [place] Office: [phone] Cell: [phone] Fax: [phone]

## 2019-08-04 ENCOUNTER — Other Ambulatory Visit: Payer: Self-pay | Admitting: Cardiothoracic Surgery

## 2019-08-04 DIAGNOSIS — I482 Chronic atrial fibrillation, unspecified: Secondary | ICD-10-CM

## 2019-08-05 ENCOUNTER — Telehealth: Payer: Self-pay | Admitting: Registered Nurse

## 2019-08-05 NOTE — Telephone Encounter (Signed)
Transitional Care call  Patient name: Connie Jarvis  DOB: 1941-01-02 1. Are you/is patient experiencing any problems since coming home? No a. Are there any questions regarding any aspect of care? No 2. Are there any questions regarding medications administration/dosing? No a. Are meds being taken as prescribed? Yes, except Colchicine she states her pharmacy doesn't have the prescription. Placed a call to [facility], the pharmacist has the prescription. Placed a call to [name] regarding the above, she verbalizes understanding.  b. "Patient should review meds with caller to confirm" Medication List Reviewed 3. Have there been any falls? No 4. Has Home Health been to the house and/or have they contacted you? No a. If not, have you tried to contact them? No b. Can we help you contact them? Yes, this provider placed a call to [facility], representative states they are awaiting the discharge summary and Loralee Pacas LCSW stated she will fax it to them. This provider placed a call to [facility] , left a voicemail, asking if she would faxed the discharge summary to [name]  5. Are bowels and bladder emptying properly? Yes a. Are there any unexpected incontinence issues? No b. If applicable, is patient following bowel/bladder programs? NA 6. Any fevers, problems with breathing, unexpected pain? No 7. Are there any skin problems or new areas of breakdown? No 8. Has the patient/family member arranged specialty MD follow up (ie cardiology/neurology/renal/surgical/etc.)?  [name] was asked to call Dr. Beatrix Fetters to schedule HFU appointment, she verbalizes understanding. a. Can we help arrange? No 9. Does the patient need any other services or support that we can help arrange? No 10. Are caregivers following through as expected in assisting the patient? Yes 11. Has the patient quit smoking, drinking alcohol, or using drugs as recommended? (                        )  Appointment date/time  08/13/2019  arrival time 11:00  For 11:20 appointment with Dr. Dagoberto Ligas. At [facility]

## 2019-08-06 DIAGNOSIS — J449 Chronic obstructive pulmonary disease, unspecified: Secondary | ICD-10-CM | POA: Diagnosis not present

## 2019-08-06 DIAGNOSIS — J9620 Acute and chronic respiratory failure, unspecified whether with hypoxia or hypercapnia: Secondary | ICD-10-CM | POA: Diagnosis not present

## 2019-08-06 DIAGNOSIS — Z9981 Dependence on supplemental oxygen: Secondary | ICD-10-CM | POA: Diagnosis not present

## 2019-08-06 DIAGNOSIS — K219 Gastro-esophageal reflux disease without esophagitis: Secondary | ICD-10-CM | POA: Diagnosis not present

## 2019-08-06 DIAGNOSIS — F329 Major depressive disorder, single episode, unspecified: Secondary | ICD-10-CM | POA: Diagnosis not present

## 2019-08-06 DIAGNOSIS — I10 Essential (primary) hypertension: Secondary | ICD-10-CM | POA: Diagnosis not present

## 2019-08-06 DIAGNOSIS — I2721 Secondary pulmonary arterial hypertension: Secondary | ICD-10-CM | POA: Diagnosis not present

## 2019-08-06 DIAGNOSIS — Z48813 Encounter for surgical aftercare following surgery on the respiratory system: Secondary | ICD-10-CM | POA: Diagnosis not present

## 2019-08-06 DIAGNOSIS — R0902 Hypoxemia: Secondary | ICD-10-CM | POA: Diagnosis not present

## 2019-08-06 DIAGNOSIS — J869 Pyothorax without fistula: Secondary | ICD-10-CM | POA: Diagnosis not present

## 2019-08-06 DIAGNOSIS — Z7902 Long term (current) use of antithrombotics/antiplatelets: Secondary | ICD-10-CM | POA: Diagnosis not present

## 2019-08-06 DIAGNOSIS — M1990 Unspecified osteoarthritis, unspecified site: Secondary | ICD-10-CM | POA: Diagnosis not present

## 2019-08-06 DIAGNOSIS — I4891 Unspecified atrial fibrillation: Secondary | ICD-10-CM | POA: Diagnosis not present

## 2019-08-06 DIAGNOSIS — Z791 Long term (current) use of non-steroidal anti-inflammatories (NSAID): Secondary | ICD-10-CM | POA: Diagnosis not present

## 2019-08-06 DIAGNOSIS — Z7982 Long term (current) use of aspirin: Secondary | ICD-10-CM | POA: Diagnosis not present

## 2019-08-06 DIAGNOSIS — Z7952 Long term (current) use of systemic steroids: Secondary | ICD-10-CM | POA: Diagnosis not present

## 2019-08-06 DIAGNOSIS — J441 Chronic obstructive pulmonary disease with (acute) exacerbation: Secondary | ICD-10-CM | POA: Diagnosis not present

## 2019-08-06 DIAGNOSIS — D62 Acute posthemorrhagic anemia: Secondary | ICD-10-CM | POA: Diagnosis not present

## 2019-08-06 DIAGNOSIS — E440 Moderate protein-calorie malnutrition: Secondary | ICD-10-CM | POA: Diagnosis not present

## 2019-08-06 DIAGNOSIS — Z79899 Other long term (current) drug therapy: Secondary | ICD-10-CM | POA: Diagnosis not present

## 2019-08-06 DIAGNOSIS — Z792 Long term (current) use of antibiotics: Secondary | ICD-10-CM | POA: Diagnosis not present

## 2019-08-07 LAB — FUNGUS CULTURE WITH STAIN

## 2019-08-07 LAB — FUNGUS CULTURE RESULT

## 2019-08-07 LAB — FUNGAL ORGANISM REFLEX

## 2019-08-09 ENCOUNTER — Ambulatory Visit: Payer: Self-pay | Admitting: Cardiothoracic Surgery

## 2019-08-09 ENCOUNTER — Encounter: Payer: Self-pay | Admitting: Cardiothoracic Surgery

## 2019-08-09 ENCOUNTER — Ambulatory Visit
Admission: RE | Admit: 2019-08-09 | Discharge: 2019-08-09 | Disposition: A | Discharge status: Home / Self Care | Payer: Medicare Other | Source: Ambulatory Visit | Attending: Cardiothoracic Surgery | Admitting: Cardiothoracic Surgery

## 2019-08-09 ENCOUNTER — Other Ambulatory Visit: Payer: Self-pay

## 2019-08-09 ENCOUNTER — Ambulatory Visit (INDEPENDENT_AMBULATORY_CARE_PROVIDER_SITE_OTHER): Payer: Self-pay | Admitting: Cardiothoracic Surgery

## 2019-08-09 ENCOUNTER — Ambulatory Visit: Payer: Medicare Other | Admitting: Cardiothoracic Surgery

## 2019-08-09 VITALS — BP 137/83 | HR 77 | Temp 97.7°F | Resp 16 | Ht 60.0 in | Wt 127.0 lb

## 2019-08-09 DIAGNOSIS — J869 Pyothorax without fistula: Secondary | ICD-10-CM

## 2019-08-09 DIAGNOSIS — J90 Pleural effusion, not elsewhere classified: Secondary | ICD-10-CM | POA: Diagnosis not present

## 2019-08-09 DIAGNOSIS — I482 Chronic atrial fibrillation, unspecified: Secondary | ICD-10-CM

## 2019-08-09 DIAGNOSIS — Z09 Encounter for follow-up examination after completed treatment for conditions other than malignant neoplasm: Secondary | ICD-10-CM

## 2019-08-10 DIAGNOSIS — D62 Acute posthemorrhagic anemia: Secondary | ICD-10-CM | POA: Diagnosis not present

## 2019-08-10 DIAGNOSIS — I2721 Secondary pulmonary arterial hypertension: Secondary | ICD-10-CM | POA: Diagnosis not present

## 2019-08-10 DIAGNOSIS — J869 Pyothorax without fistula: Secondary | ICD-10-CM | POA: Diagnosis not present

## 2019-08-10 DIAGNOSIS — E440 Moderate protein-calorie malnutrition: Secondary | ICD-10-CM | POA: Diagnosis not present

## 2019-08-10 DIAGNOSIS — Z48813 Encounter for surgical aftercare following surgery on the respiratory system: Secondary | ICD-10-CM | POA: Diagnosis not present

## 2019-08-10 DIAGNOSIS — F329 Major depressive disorder, single episode, unspecified: Secondary | ICD-10-CM | POA: Diagnosis not present

## 2019-08-11 DIAGNOSIS — D62 Acute posthemorrhagic anemia: Secondary | ICD-10-CM | POA: Diagnosis not present

## 2019-08-11 DIAGNOSIS — I2721 Secondary pulmonary arterial hypertension: Secondary | ICD-10-CM | POA: Diagnosis not present

## 2019-08-11 DIAGNOSIS — E440 Moderate protein-calorie malnutrition: Secondary | ICD-10-CM | POA: Diagnosis not present

## 2019-08-11 DIAGNOSIS — Z48813 Encounter for surgical aftercare following surgery on the respiratory system: Secondary | ICD-10-CM | POA: Diagnosis not present

## 2019-08-11 DIAGNOSIS — J869 Pyothorax without fistula: Secondary | ICD-10-CM | POA: Diagnosis not present

## 2019-08-11 DIAGNOSIS — F329 Major depressive disorder, single episode, unspecified: Secondary | ICD-10-CM | POA: Diagnosis not present

## 2019-08-12 DIAGNOSIS — R195 Other fecal abnormalities: Secondary | ICD-10-CM | POA: Diagnosis not present

## 2019-08-12 DIAGNOSIS — E440 Moderate protein-calorie malnutrition: Secondary | ICD-10-CM | POA: Diagnosis not present

## 2019-08-12 DIAGNOSIS — Z6825 Body mass index (BMI) 25.0-25.9, adult: Secondary | ICD-10-CM | POA: Diagnosis not present

## 2019-08-12 DIAGNOSIS — Z0000 Encounter for general adult medical examination without abnormal findings: Secondary | ICD-10-CM | POA: Diagnosis not present

## 2019-08-12 DIAGNOSIS — Z48813 Encounter for surgical aftercare following surgery on the respiratory system: Secondary | ICD-10-CM | POA: Diagnosis not present

## 2019-08-12 DIAGNOSIS — I2721 Secondary pulmonary arterial hypertension: Secondary | ICD-10-CM | POA: Diagnosis not present

## 2019-08-12 DIAGNOSIS — J869 Pyothorax without fistula: Secondary | ICD-10-CM | POA: Diagnosis not present

## 2019-08-12 DIAGNOSIS — F329 Major depressive disorder, single episode, unspecified: Secondary | ICD-10-CM | POA: Diagnosis not present

## 2019-08-12 DIAGNOSIS — D62 Acute posthemorrhagic anemia: Secondary | ICD-10-CM | POA: Diagnosis not present

## 2019-08-12 DIAGNOSIS — Z1331 Encounter for screening for depression: Secondary | ICD-10-CM | POA: Diagnosis not present

## 2019-08-13 ENCOUNTER — Encounter
Payer: Medicare Other | Attending: Physical Medicine and Rehabilitation | Admitting: Physical Medicine and Rehabilitation

## 2019-08-13 ENCOUNTER — Encounter: Payer: Self-pay | Admitting: Physical Medicine and Rehabilitation

## 2019-08-13 ENCOUNTER — Other Ambulatory Visit: Payer: Self-pay

## 2019-08-13 VITALS — BP 120/67 | HR 77 | Temp 97.5°F | Ht 58.75 in | Wt 126.6 lb

## 2019-08-13 DIAGNOSIS — J4540 Moderate persistent asthma, uncomplicated: Secondary | ICD-10-CM | POA: Insufficient documentation

## 2019-08-13 DIAGNOSIS — R5381 Other malaise: Secondary | ICD-10-CM | POA: Diagnosis not present

## 2019-08-13 DIAGNOSIS — Z4802 Encounter for removal of sutures: Secondary | ICD-10-CM

## 2019-08-13 DIAGNOSIS — Z4800 Encounter for change or removal of nonsurgical wound dressing: Secondary | ICD-10-CM | POA: Diagnosis not present

## 2019-08-13 DIAGNOSIS — Z9981 Dependence on supplemental oxygen: Secondary | ICD-10-CM | POA: Insufficient documentation

## 2019-08-13 DIAGNOSIS — J45909 Unspecified asthma, uncomplicated: Secondary | ICD-10-CM | POA: Insufficient documentation

## 2019-08-13 DIAGNOSIS — I272 Pulmonary hypertension, unspecified: Secondary | ICD-10-CM | POA: Diagnosis not present

## 2019-08-13 NOTE — Progress Notes (Signed)
Subjective:    Patient ID: Connie Jarvis, female    DOB: [birth year], 79 y.o.   MRN: [id number]  HPI   Pt is a 79 yr old female with previous thoracotomy, empyema drainage, and debility who is O2 dependent.  Got JP drains out- but still had 2 sutures in place on JP drain sites. Staples were all out.   Asking if can come off O2 at all.  Hasn't asked Dr Orvan Seen if could come off O2.  Has been on O2 since Labor Day- 2L.    Had accidentally gotten off O2 sometimes- and not SOB.   Husband was a Psychologist, occupational EMT for years.  PT only- 2x/week so far- walking in hall, doing home exercises, still has issues with balance. Marching in place as well.  Tolerating well- tired afterwards.   Knees are biggest obstacles-   Never smoked- no COPD- went on O2 due to empyema possibly.  Pain Inventory Average Pain 0 Pain Right Now 0 My pain is tightness  In the last 24 hours, has pain interfered with the following? General activity 3 Relation with others 0 Enjoyment of life 3 What TIME of day is your pain at its worst? none Sleep (in general) Good  Pain is worse with: walking Pain improves with: na Relief from Meds: na  Mobility use a walker how many minutes can you walk? a few ability to climb steps?  no do you drive?  no  Function retired I need assistance with the following:  meal prep, household duties and shopping  Neuro/Psych No problems in this area  Prior Studies x-rays  Physicians involved in your care Any changes since last visit?  no   Family History  Problem Relation Age of Onset  . Colon cancer Neg Hx   . Colon polyps Neg Hx   . Esophageal cancer Neg Hx   . Pancreatic cancer Neg Hx   . Rectal cancer Neg Hx   . Stomach cancer Neg Hx    Social History   Socioeconomic History  . Marital status: Married    Spouse name: Not on file  . Number of children: Not on file  . Years of education: Not on file  . Highest education level: Not on file  Occupational  History  . Not on file  Tobacco Use  . Smoking status: Never Smoker  . Smokeless tobacco: Never Used  Substance and Sexual Activity  . Alcohol use: Not Currently  . Drug use: Never  . Sexual activity: Not on file  Other Topics Concern  . Not on file  Social History Narrative  . Not on file   Social Determinants of Health   Financial Resource Strain:   . Difficulty of Paying Living Expenses:   Food Insecurity:   . Worried About Charity fundraiser in the Last Year:   . Arboriculturist in the Last Year:   Transportation Needs:   . Film/video editor (Medical):   Marland Kitchen Lack of Transportation (Non-Medical):   Physical Activity:   . Days of Exercise per Week:   . Minutes of Exercise per Session:   Stress:   . Feeling of Stress :   Social Connections:   . Frequency of Communication with Friends and Family:   . Frequency of Social Gatherings with Friends and Family:   . Attends Religious Services:   . Active Member of Clubs or Organizations:   . Attends Archivist Meetings:   Marland Kitchen Marital Status:  Past Surgical History:  Procedure Laterality Date  . APPLICATION OF WOUND VAC Left 07/08/2019   Procedure: APPLICATION OF WOUND VAC;  Surgeon: Wonda Olds, MD;  Location: [facility];  Service: Thoracic;  Laterality: Left;  . APPLICATION OF WOUND VAC Left 07/11/2019   Procedure: WOUND VAC CHANGE AND WOUND IRRIGATION AND SHARP DEBRIDEMENT;  Surgeon: Wonda Olds, MD;  Location: [facility];  Service: Thoracic;  Laterality: Left;  . APPLICATION OF WOUND VAC Left 07/13/2019   Procedure: APPLICATION OF WOUND VAC USING PREVENA DRESSING;  Surgeon: Wonda Olds, MD;  Location: [facility];  Service: Thoracic;  Laterality: Left;  . CENTRAL LINE INSERTION Left 07/08/2019   Procedure: CENTRAL LINE INSERTION;  Surgeon: Jolaine Artist, MD;  Location: [facility];  Service: Cardiovascular;  Laterality: Left;  . CHEST TUBE INSERTION Right 07/13/2019   Procedure: Chest Tube  Insertion;  Surgeon: Wonda Olds, MD;  Location[facility];  Service: Thoracic;  Laterality: Right;  . COLONOSCOPY    . EMPYEMA DRAINAGE Left 07/08/2019   Procedure: EMPYEMA DRAINAGE;  Surgeon: Wonda Olds, MD;  Location: [facility];  Service: Thoracic;  Laterality: Left;  . GREATER OMENTAL FLAP CLOSURE Left 07/13/2019   Procedure: GREATER OMENTAL FLAP CLOSURE;  Surgeon: Wonda Olds, MD;  Location: [facility];  Service: Thoracic;  Laterality: Left;  . MOUTH SURGERY    . RIGHT/LEFT HEART CATH AND CORONARY ANGIOGRAPHY N/A 07/08/2019   Procedure: RIGHT/LEFT HEART CATH AND CORONARY ANGIOGRAPHY;  Surgeon: Jolaine Artist, MD;  Location: [facility];  Service: Cardiovascular;  Laterality: N/A;  . THORACOTOMY Left 07/08/2019   Procedure: THORACOTOMY MAJOR;  Surgeon: Wonda Olds, MD;  Location: [facility];  Service: Thoracic;  Laterality: Left;  . THORACOTOMY Left 07/13/2019   Procedure: CLOSURE OF THORACOTOMY;  Surgeon: Wonda Olds, MD;  Location: [facility];  Service: Thoracic;  Laterality: Left;  . TONSILLECTOMY    . TUBAL LIGATION    . VASCULAR ASSESSMENT WITH SPY ELITE Left 07/13/2019   Procedure: Vascular Assessment With Spy Elite;  Surgeon: Wonda Olds, MD;  Location[facility];  Service: Thoracic;  Laterality: Left;   Past Medical History:  Diagnosis Date  . Allergy   . Anemia   . Anxiety   . Arthritis   . Cataract    pt. not sure which eye early stage  . Depression   . GERD (gastroesophageal reflux disease)   . Hypertension    BP 120/67   Pulse 77   Temp (!) 97.5 F (36.4 C)   Ht 4' 10.75" (1.492 m)   Wt 126 lb 9.6 oz (57.4 kg)   SpO2 98%   BMI 25.79 kg/m   Opioid Risk Score:   Fall Risk Score:  `1  Depression screen PHQ 2/9  Depression screen PHQ 2/9 08/13/2019  Decreased Interest 0  Down, Depressed, Hopeless 0  PHQ - 2 Score 0  Altered sleeping 0  Tired, decreased energy 1  Change in appetite 1  Feeling bad or failure about yourself  0  Trouble  concentrating 0  Moving slowly or fidgety/restless 1  Suicidal thoughts 0  PHQ-9 Score 3  Difficult doing work/chores Not difficult at all     Review of Systems  Constitutional: Positive for unexpected weight change.  All other systems reviewed and are negative.      Objective:   Physical Exam Awake, alert, appropriate, On O2 by [place]; accompanied by daughter RRR- borderline tachycardia CTA B/L-  no W/R/R- good air movement abd soft, NT, ND, (+)BS Wound on midline abd- healing well- steristrips in place And L side wound- 2 open spots- slough notable in center- sutures in place- removed      Assessment & Plan:  Pt is a 79 yr old female with previous thoracotomy, empyema drainage, and debility who is O2 dependent.  1. Have a pulse ox- wants to keep pulse  Oxygen level >92% and no Shortness of breath- No more than 1 hour at a time- at first while awake. Also has dx of pulmonary HTN- which will need O2 long term;   2. Removed sutures on Left side/JP drain sites-   3. Already takes Celebrex currently- taking 1x/day- add the Voltaren gel to that- up to 4x/day. Apply to all side of knees and ankle- can a max of 4G at a time  4. If Voltaren gel doesn't work, Lidocaine cream might be helpful.  5. Referral to Pulmonary to see if can come off O2.   6. F/U as needed    I have spent a total of 30 minutes on appointment more than 20 minutes discussing pulm options- decided to do referral

## 2019-08-13 NOTE — Patient Instructions (Signed)
Pt is a 79 yr old female with previous thoracotomy, empyema drainage, and debility who is O2 dependent.  1. Have a pulse ox- wants to keep pulse  Oxygen level >92% and no Shortness of breath- No more than 1 hour at a time- at first while awake. Also has dx of pulmonary HTN- which will need O2 long term;   2. Removed sutures on Left side/JP drain sites-   3. Already takes Celebrex currently- taking 1x/day- add the Voltaren gel to that- up to 4x/day. Apply to all side of knees and ankle- can a max of 4G at a time  4. If Voltaren gel doesn't work, Lidocaine cream might be helpful.  5. Referral to Pulmonary to see if can come off O2.   6. F/U as needed

## 2019-08-16 NOTE — Progress Notes (Signed)
[address]       [place] [address]             [phone]     CARDIOTHORACIC SURGERY OFFICE NOTE  Referring Provider is Ronita Hipps, MD Primary Cardiologist is No primary care provider on file. 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 She underwent thoracotomy and ultimately omental flap repair to fill the empyema cavity. She was discharged to rehab unit and now returns for wound check. She has been keeping up with bulb drainage output. Denies pain, fever, chills.    Current Outpatient Medications  Medication Sig Dispense Refill  . acetaminophen (TYLENOL) 500 MG tablet Take 500 mg by mouth every 6 (six) hours as needed for mild pain, fever or headache.     Marland Kitchen amiodarone (PACERONE) 200 MG tablet Take 1 tablet (200 mg total) by mouth 2 (two) times daily. 60 tablet 0  . apixaban (ELIQUIS) 5 MG TABS tablet Take 1 tablet (5 mg total) by mouth 2 (two) times daily. 60 tablet 0  . bisacodyl (DULCOLAX) 5 MG EC tablet Take 2 tablets (10 mg total) by mouth daily as needed for moderate constipation. 30 tablet 0  . celecoxib (CELEBREX) 200 MG capsule Take 1 capsule (200 mg total) by mouth daily.  3  . colchicine 0.6 MG tablet Take 0.5 tablets (0.3 mg total) by mouth daily. (Patient not taking: Reported on 08/13/2019) 15 tablet 0  . ferrous Q000111Q C-folic acid (TRINSICON / FOLTRIN) capsule Take 1 capsule by mouth 2 (two) times daily after a meal. 60 capsule 0  . furosemide (LASIX) 20 MG tablet Take 1 tablet (20 mg total) by mouth daily. 30 tablet 0  . Hydrocortisone (GERHARDT'S BUTT CREAM) CREA Apply 1 application topically 2 (two) times daily.    Marland Kitchen levalbuterol (XOPENEX) 0.63 MG/3ML nebulizer solution Take 3 mLs (0.63 mg total) by nebulization every 6 (six) hours as needed for wheezing or shortness of breath. 3 mL 12  . loperamide HCl (IMODIUM) 1 MG/7.5ML suspension Take 7.5 mLs (1 mg total) by mouth as needed for  diarrhea or loose stools. (Patient not taking: Reported on 08/13/2019) 120 mL 0  . mometasone-formoterol (DULERA) 100-5 MCG/ACT AERO Inhale 2 puffs into the lungs 2 (two) times daily. Once this is used up ---you can resume symbicort    . montelukast (SINGULAIR) 10 MG tablet Take 1 tablet (10 mg total) by mouth daily.    . Multiple Vitamin (MULTIVITAMIN WITH MINERALS) TABS tablet Take 1 tablet by mouth daily.    . pantoprazole (PROTONIX) 40 MG tablet Take 1 tablet (40 mg total) by mouth daily. 30 tablet 0  . traZODone (DESYREL) 50 MG tablet Take 0.5 tablets (25 mg total) by mouth at bedtime as needed for sleep. 30 tablet 0   No current facility-administered medications for this visit.      Physical Exam:   BP 137/83 (BP Location: Right Arm, Patient Position: Sitting, Cuff Size: Normal)   Pulse 77   Temp 97.7 F (36.5 C)   Resp 16   Ht 5' (1.524 m)   Wt 57.6 kg   SpO2 98% Comment: 2L O2  BMI 24.80 kg/m   General:  Chronically-ill appearing, NAD  Chest:   cta  CV:   rrr  Incisions:  Well-healed  Abdomen:  sntnd  Extremities:  Mild edema  Diagnostic Tests:  CXR with clear lung fields   Impression:  Doing well after treatment of left empyema  Plan:  F/u in 2 weeks for final wound check.   I spent in excess of 25 minutes during the conduct of this office consultation and >50% of this time involved direct face-to-face encounter with the patient for counseling and/or coordination of their care.  Level 2                 10 minutes Level 3                 15 minutes Level 4                 25 minutes Level 5                 40 minutes  B. Murvin Natal, MD 08/16/2019 11:12 AM

## 2019-08-17 DIAGNOSIS — F329 Major depressive disorder, single episode, unspecified: Secondary | ICD-10-CM | POA: Diagnosis not present

## 2019-08-17 DIAGNOSIS — D62 Acute posthemorrhagic anemia: Secondary | ICD-10-CM | POA: Diagnosis not present

## 2019-08-17 DIAGNOSIS — I2721 Secondary pulmonary arterial hypertension: Secondary | ICD-10-CM | POA: Diagnosis not present

## 2019-08-17 DIAGNOSIS — J869 Pyothorax without fistula: Secondary | ICD-10-CM | POA: Diagnosis not present

## 2019-08-17 DIAGNOSIS — E440 Moderate protein-calorie malnutrition: Secondary | ICD-10-CM | POA: Diagnosis not present

## 2019-08-17 DIAGNOSIS — Z48813 Encounter for surgical aftercare following surgery on the respiratory system: Secondary | ICD-10-CM | POA: Diagnosis not present

## 2019-08-18 DIAGNOSIS — J869 Pyothorax without fistula: Secondary | ICD-10-CM | POA: Diagnosis not present

## 2019-08-18 DIAGNOSIS — I2721 Secondary pulmonary arterial hypertension: Secondary | ICD-10-CM | POA: Diagnosis not present

## 2019-08-18 DIAGNOSIS — Z48813 Encounter for surgical aftercare following surgery on the respiratory system: Secondary | ICD-10-CM | POA: Diagnosis not present

## 2019-08-18 DIAGNOSIS — F329 Major depressive disorder, single episode, unspecified: Secondary | ICD-10-CM | POA: Diagnosis not present

## 2019-08-18 DIAGNOSIS — D62 Acute posthemorrhagic anemia: Secondary | ICD-10-CM | POA: Diagnosis not present

## 2019-08-18 DIAGNOSIS — E440 Moderate protein-calorie malnutrition: Secondary | ICD-10-CM | POA: Diagnosis not present

## 2019-08-19 DIAGNOSIS — D62 Acute posthemorrhagic anemia: Secondary | ICD-10-CM | POA: Diagnosis not present

## 2019-08-19 DIAGNOSIS — I2721 Secondary pulmonary arterial hypertension: Secondary | ICD-10-CM | POA: Diagnosis not present

## 2019-08-19 DIAGNOSIS — F329 Major depressive disorder, single episode, unspecified: Secondary | ICD-10-CM | POA: Diagnosis not present

## 2019-08-19 DIAGNOSIS — J869 Pyothorax without fistula: Secondary | ICD-10-CM | POA: Diagnosis not present

## 2019-08-19 DIAGNOSIS — E440 Moderate protein-calorie malnutrition: Secondary | ICD-10-CM | POA: Diagnosis not present

## 2019-08-19 DIAGNOSIS — Z48813 Encounter for surgical aftercare following surgery on the respiratory system: Secondary | ICD-10-CM | POA: Diagnosis not present

## 2019-08-23 ENCOUNTER — Other Ambulatory Visit: Payer: Self-pay

## 2019-08-23 ENCOUNTER — Ambulatory Visit (INDEPENDENT_AMBULATORY_CARE_PROVIDER_SITE_OTHER): Payer: Self-pay | Admitting: Cardiothoracic Surgery

## 2019-08-23 VITALS — BP 150/84 | HR 76 | Temp 97.6°F | Resp 20 | Ht <= 58 in | Wt 126.0 lb

## 2019-08-23 DIAGNOSIS — I482 Chronic atrial fibrillation, unspecified: Secondary | ICD-10-CM

## 2019-08-23 DIAGNOSIS — Z09 Encounter for follow-up examination after completed treatment for conditions other than malignant neoplasm: Secondary | ICD-10-CM

## 2019-08-23 DIAGNOSIS — J869 Pyothorax without fistula: Secondary | ICD-10-CM

## 2019-08-23 NOTE — Progress Notes (Signed)
[address]       [place] [address]             [phone]     CARDIOTHORACIC SURGERY OFFICE NOTE  Referring Provider is Ronita Hipps, MD Primary Cardiologist is No primary care provider on file. PCP is Ronita Hipps, MD   HPI:  79 yo lady s/p left thoracotomy for empyema necesitans. This was treated with omental flap repair and she has done well. Presents for final outpatient visit. No complaints of pain or fever.   Current Outpatient Medications  Medication Sig Dispense Refill  . acetaminophen (TYLENOL) 500 MG tablet Take 500 mg by mouth every 6 (six) hours as needed for mild pain, fever or headache.     Marland Kitchen amiodarone (PACERONE) 200 MG tablet Take 1 tablet (200 mg total) by mouth 2 (two) times daily. 60 tablet 0  . apixaban (ELIQUIS) 5 MG TABS tablet Take 1 tablet (5 mg total) by mouth 2 (two) times daily. 60 tablet 0  . bisacodyl (DULCOLAX) 5 MG EC tablet Take 2 tablets (10 mg total) by mouth daily as needed for moderate constipation. 30 tablet 0  . celecoxib (CELEBREX) 200 MG capsule Take 1 capsule (200 mg total) by mouth daily.  3  . colchicine 0.6 MG tablet Take 0.5 tablets (0.3 mg total) by mouth daily. 15 tablet 0  . ferrous [id number] C-folic acid (TRINSICON / FOLTRIN) capsule Take 1 capsule by mouth 2 (two) times daily after a meal. 60 capsule 0  . furosemide (LASIX) 20 MG tablet Take 1 tablet (20 mg total) by mouth daily. 30 tablet 0  . Hydrocortisone (GERHARDT'S BUTT CREAM) CREA Apply 1 application topically 2 (two) times daily.    Marland Kitchen levalbuterol (XOPENEX) 0.63 MG/3ML nebulizer solution Take 3 mLs (0.63 mg total) by nebulization every 6 (six) hours as needed for wheezing or shortness of breath. 3 mL 12  . loperamide HCl (IMODIUM) 1 MG/7.5ML suspension Take 7.5 mLs (1 mg total) by mouth as needed for diarrhea or loose stools. 120 mL 0  . mometasone-formoterol (DULERA) 100-5 MCG/ACT AERO Inhale 2 puffs into the lungs 2 (two) times  daily. Once this is used up ---you can resume symbicort    . montelukast (SINGULAIR) 10 MG tablet Take 1 tablet (10 mg total) by mouth daily.    . Multiple Vitamin (MULTIVITAMIN WITH MINERALS) TABS tablet Take 1 tablet by mouth daily.    . pantoprazole (PROTONIX) 40 MG tablet Take 1 tablet (40 mg total) by mouth daily. 30 tablet 0  . traZODone (DESYREL) 50 MG tablet Take 0.5 tablets (25 mg total) by mouth at bedtime as needed for sleep. 30 tablet 0   No current facility-administered medications for this visit.      Physical Exam:   BP (!) 150/84   Pulse 76   Temp 97.6 F (36.4 C) (Skin)   Resp 20   Ht (!) 5" (0.127 m)   Wt 57.2 kg   SpO2 97% Comment: 2L O2 oer [place]  BMI 3543.51 kg/m   General:  Well-appearing, NAD  Chest:   cta  CV:   rrr  Incisions:  Well-healed  Abdomen:  sntnd  Extremities:  Mild edema  Diagnostic Tests:  n/a   Impression:  Doing well after omental flap repair of left empyema space  Plan:  F/u as needed Ok to stop amiodarone and eliquis--has not been observed in afib for several weeks  I spent in excess  of 20 minutes during the conduct of this office consultation and >50% of this time involved direct face-to-face encounter with the patient for counseling and/or coordination of their care.  Level 2                 10 minutes Level 3                 15 minutes Level 4                 25 minutes Level 5                 40 minutes  B. Murvin Natal, MD 08/23/2019 3:13 PM

## 2019-08-24 DIAGNOSIS — I2721 Secondary pulmonary arterial hypertension: Secondary | ICD-10-CM | POA: Diagnosis not present

## 2019-08-24 DIAGNOSIS — Z48813 Encounter for surgical aftercare following surgery on the respiratory system: Secondary | ICD-10-CM | POA: Diagnosis not present

## 2019-08-24 DIAGNOSIS — F329 Major depressive disorder, single episode, unspecified: Secondary | ICD-10-CM | POA: Diagnosis not present

## 2019-08-24 DIAGNOSIS — D62 Acute posthemorrhagic anemia: Secondary | ICD-10-CM | POA: Diagnosis not present

## 2019-08-24 DIAGNOSIS — J869 Pyothorax without fistula: Secondary | ICD-10-CM | POA: Diagnosis not present

## 2019-08-24 DIAGNOSIS — E440 Moderate protein-calorie malnutrition: Secondary | ICD-10-CM | POA: Diagnosis not present

## 2019-08-25 ENCOUNTER — Other Ambulatory Visit: Payer: Self-pay | Admitting: Physical Medicine and Rehabilitation

## 2019-08-25 DIAGNOSIS — Z48813 Encounter for surgical aftercare following surgery on the respiratory system: Secondary | ICD-10-CM | POA: Diagnosis not present

## 2019-08-25 DIAGNOSIS — J869 Pyothorax without fistula: Secondary | ICD-10-CM | POA: Diagnosis not present

## 2019-08-25 DIAGNOSIS — F329 Major depressive disorder, single episode, unspecified: Secondary | ICD-10-CM | POA: Diagnosis not present

## 2019-08-25 DIAGNOSIS — I2721 Secondary pulmonary arterial hypertension: Secondary | ICD-10-CM | POA: Diagnosis not present

## 2019-08-25 DIAGNOSIS — E440 Moderate protein-calorie malnutrition: Secondary | ICD-10-CM | POA: Diagnosis not present

## 2019-08-25 DIAGNOSIS — D62 Acute posthemorrhagic anemia: Secondary | ICD-10-CM | POA: Diagnosis not present

## 2019-08-25 NOTE — Telephone Encounter (Signed)
Refill request of Protonix. No indication in last note to continue. Is this okay to refill?

## 2019-08-26 DIAGNOSIS — I2510 Atherosclerotic heart disease of native coronary artery without angina pectoris: Secondary | ICD-10-CM | POA: Diagnosis not present

## 2019-08-26 DIAGNOSIS — I340 Nonrheumatic mitral (valve) insufficiency: Secondary | ICD-10-CM | POA: Diagnosis not present

## 2019-08-26 DIAGNOSIS — R000 Tachycardia, unspecified: Secondary | ICD-10-CM | POA: Diagnosis not present

## 2019-08-26 DIAGNOSIS — I272 Pulmonary hypertension, unspecified: Secondary | ICD-10-CM | POA: Diagnosis not present

## 2019-08-26 DIAGNOSIS — Z7982 Long term (current) use of aspirin: Secondary | ICD-10-CM | POA: Diagnosis not present

## 2019-08-26 DIAGNOSIS — I493 Ventricular premature depolarization: Secondary | ICD-10-CM | POA: Diagnosis not present

## 2019-08-26 DIAGNOSIS — I351 Nonrheumatic aortic (valve) insufficiency: Secondary | ICD-10-CM | POA: Diagnosis not present

## 2019-08-27 ENCOUNTER — Other Ambulatory Visit: Payer: Self-pay | Admitting: Physical Medicine and Rehabilitation

## 2019-08-27 DIAGNOSIS — Z48813 Encounter for surgical aftercare following surgery on the respiratory system: Secondary | ICD-10-CM | POA: Diagnosis not present

## 2019-08-27 DIAGNOSIS — D62 Acute posthemorrhagic anemia: Secondary | ICD-10-CM | POA: Diagnosis not present

## 2019-08-27 DIAGNOSIS — I2721 Secondary pulmonary arterial hypertension: Secondary | ICD-10-CM | POA: Diagnosis not present

## 2019-08-27 DIAGNOSIS — E440 Moderate protein-calorie malnutrition: Secondary | ICD-10-CM | POA: Diagnosis not present

## 2019-08-27 DIAGNOSIS — J869 Pyothorax without fistula: Secondary | ICD-10-CM | POA: Diagnosis not present

## 2019-08-27 DIAGNOSIS — F329 Major depressive disorder, single episode, unspecified: Secondary | ICD-10-CM | POA: Diagnosis not present

## 2019-08-30 DIAGNOSIS — F329 Major depressive disorder, single episode, unspecified: Secondary | ICD-10-CM | POA: Diagnosis not present

## 2019-08-30 DIAGNOSIS — J869 Pyothorax without fistula: Secondary | ICD-10-CM | POA: Diagnosis not present

## 2019-08-30 DIAGNOSIS — E440 Moderate protein-calorie malnutrition: Secondary | ICD-10-CM | POA: Diagnosis not present

## 2019-08-30 DIAGNOSIS — I2721 Secondary pulmonary arterial hypertension: Secondary | ICD-10-CM | POA: Diagnosis not present

## 2019-08-30 DIAGNOSIS — D62 Acute posthemorrhagic anemia: Secondary | ICD-10-CM | POA: Diagnosis not present

## 2019-08-30 DIAGNOSIS — Z48813 Encounter for surgical aftercare following surgery on the respiratory system: Secondary | ICD-10-CM | POA: Diagnosis not present

## 2019-08-31 DIAGNOSIS — D62 Acute posthemorrhagic anemia: Secondary | ICD-10-CM | POA: Diagnosis not present

## 2019-08-31 DIAGNOSIS — F329 Major depressive disorder, single episode, unspecified: Secondary | ICD-10-CM | POA: Diagnosis not present

## 2019-08-31 DIAGNOSIS — J869 Pyothorax without fistula: Secondary | ICD-10-CM | POA: Diagnosis not present

## 2019-08-31 DIAGNOSIS — Z48813 Encounter for surgical aftercare following surgery on the respiratory system: Secondary | ICD-10-CM | POA: Diagnosis not present

## 2019-08-31 DIAGNOSIS — I2721 Secondary pulmonary arterial hypertension: Secondary | ICD-10-CM | POA: Diagnosis not present

## 2019-08-31 DIAGNOSIS — E440 Moderate protein-calorie malnutrition: Secondary | ICD-10-CM | POA: Diagnosis not present

## 2019-09-01 DIAGNOSIS — R195 Other fecal abnormalities: Secondary | ICD-10-CM | POA: Diagnosis not present

## 2019-09-03 ENCOUNTER — Ambulatory Visit (INDEPENDENT_AMBULATORY_CARE_PROVIDER_SITE_OTHER): Payer: Medicare Other | Admitting: Critical Care Medicine

## 2019-09-03 ENCOUNTER — Encounter: Payer: Self-pay | Admitting: Critical Care Medicine

## 2019-09-03 ENCOUNTER — Other Ambulatory Visit: Payer: Self-pay

## 2019-09-03 VITALS — BP 122/87 | HR 68 | Temp 97.1°F | Ht 60.0 in | Wt 125.6 lb

## 2019-09-03 DIAGNOSIS — E440 Moderate protein-calorie malnutrition: Secondary | ICD-10-CM | POA: Diagnosis not present

## 2019-09-03 DIAGNOSIS — Z48813 Encounter for surgical aftercare following surgery on the respiratory system: Secondary | ICD-10-CM | POA: Diagnosis not present

## 2019-09-03 DIAGNOSIS — J9611 Chronic respiratory failure with hypoxia: Secondary | ICD-10-CM

## 2019-09-03 DIAGNOSIS — J869 Pyothorax without fistula: Secondary | ICD-10-CM | POA: Diagnosis not present

## 2019-09-03 DIAGNOSIS — D62 Acute posthemorrhagic anemia: Secondary | ICD-10-CM | POA: Diagnosis not present

## 2019-09-03 DIAGNOSIS — I2721 Secondary pulmonary arterial hypertension: Secondary | ICD-10-CM | POA: Diagnosis not present

## 2019-09-03 DIAGNOSIS — F329 Major depressive disorder, single episode, unspecified: Secondary | ICD-10-CM | POA: Diagnosis not present

## 2019-09-03 NOTE — Patient Instructions (Addendum)
Thank you for visiting Dr. Carlis Abbott at [facility]. We recommend the following: Orders Placed This Encounter  Procedures  . Pulse oximetry, overnight  . Pulmonary function test   Orders Placed This Encounter  Procedures  . Pulse oximetry, overnight    Standing Status:   Future    Standing Expiration Date:   09/02/2020  . Pulmonary function test    Standing Status:   Future    Standing Expiration Date:   09/02/2020    Scheduling Instructions:     Please schedule in June 2021    Order Specific Question:   Where should this test be performed?    Answer:   [facility]    Order Specific Question:   Full PFT: includes the following: basic spirometry, spirometry pre & post bronchodilator, diffusion capacity (DLCO), lung volumes    Answer:   Full PFT      Return in about 2 months (around 11/03/2019).    Please do your part to reduce the spread of COVID-19.

## 2019-09-03 NOTE — Progress Notes (Signed)
Synopsis: Referred in April 2021 for hypoxia by Courtney Heys, MD  Subjective:   PATIENT ID: Connie Jarvis GENDER: female DOB: 23-Dec-1940, MRN: [id number]  Chief Complaint  Patient presents with  . Consult    Patient was in hospital in Feb for almost 4 weeks. Wants to know if she can cut back on oxygen and wants to know if she can drive. Patient has numbness in feet and ankles that started when in the hospital. Patient denies cough. Patient denies shortness of breath except for certain activites with PT.     Mrs. Grinnan is a 79 year old woman who presents for evaluation of chronic hypoxic respiratory failure.  She is accompanied today by her husband.  She is formerly a patient of Dr. Carren Rang in [facility], but wishes to transfer her care.  She has chronic hypoxic respiratory failure since 2020.  She did not originally use the 2 L of supplemental oxygen that was prescribed, but she began using it after a hospital admission for pneumonia around Labor Day 2020.  She was hospitalized at [facility] for several weeks in February 2021 for respiratory failure and sepsis due to an empyema necessitans. She underwent a thoracotomy on 2/10, and subsequently developed a wound infection requiring an omental flap.  After discharge she was able to go to inpatient rehab, where she has progressively improved.  She is feeling well and was wondering if she still needed her supplemental oxygen.  When tested at the beginning of her visit she had desaturations associated with mild chest discomfort which was relieved by replacing her oxygen.  She has had asthma her entire life and was on Symbicort prior to admission.  She was diagnosed with pulmonary hypertension in 1999 and was on sildenafil prior to her hospital admission.  She underwent right heart cath with PVR <1 WU prior to thoracotomy.  She was discharged without any pulmonary vasodilators.  She has minimal chronic leg edema, which is much improved compared to prior to  her February hospitalization.  Her edema worsens throughout the day and improves with her legs propped up.  She denies wheezing, sputum production, cough, chest pain, or dizziness.  She overall feels that she is doing well.  No history of tobacco use.  No family history of lung disease. 08/23/2019 [facility] note reviewed.  Hospital discharge summary reviewed.        Past Medical History:  Diagnosis Date  . Allergy   . Anemia   . Anxiety   . Arthritis   . Cataract    pt. not sure which eye early stage  . Depression   . GERD (gastroesophageal reflux disease)   . Hypertension      Family History  Problem Relation Age of Onset  . Osteoporosis Mother   . Parkinson's disease Mother   . CVA Father   . Heart attack Father   . Lung cancer Paternal Grandfather   . Colon cancer Neg Hx   . Colon polyps Neg Hx   . Esophageal cancer Neg Hx   . Pancreatic cancer Neg Hx   . Rectal cancer Neg Hx   . Stomach cancer Neg Hx      Past Surgical History:  Procedure Laterality Date  . APPLICATION OF WOUND VAC Left 07/08/2019   Procedure: APPLICATION OF WOUND VAC;  Surgeon: Wonda Olds, MD;  Location: [facility] OR;  Service: Thoracic;  Laterality: Left;  . APPLICATION OF WOUND VAC Left 07/11/2019   Procedure: WOUND VAC CHANGE AND  WOUND IRRIGATION AND SHARP DEBRIDEMENT;  Surgeon: Wonda Olds, MD;  Location: [facility] OR;  Service: Thoracic;  Laterality: Left;  . APPLICATION OF WOUND VAC Left 07/13/2019   Procedure: APPLICATION OF WOUND VAC USING PREVENA DRESSING;  Surgeon: Wonda Olds, MD;  Location: [facility] OR;  Service: Thoracic;  Laterality: Left;  . CENTRAL LINE INSERTION Left 07/08/2019   Procedure: CENTRAL LINE INSERTION;  Surgeon: Jolaine Artist, MD;  Location: [facility];  Service: Cardiovascular;  Laterality: Left;  . CHEST TUBE INSERTION Right 07/13/2019   Procedure: Chest Tube Insertion;  Surgeon: Wonda Olds, MD;  Location: [facility] OR;  Service: Thoracic;  Laterality: Right;    . COLONOSCOPY    . EMPYEMA DRAINAGE Left 07/08/2019   Procedure: EMPYEMA DRAINAGE;  Surgeon: Wonda Olds, MD;  Location: [facility];  Service: Thoracic;  Laterality: Left;  . GREATER OMENTAL FLAP CLOSURE Left 07/13/2019   Procedure: GREATER OMENTAL FLAP CLOSURE;  Surgeon: Wonda Olds, MD;  Location: [facility];  Service: Thoracic;  Laterality: Left;  . MOUTH SURGERY    . RIGHT/LEFT HEART CATH AND CORONARY ANGIOGRAPHY N/A 07/08/2019   Procedure: RIGHT/LEFT HEART CATH AND CORONARY ANGIOGRAPHY;  Surgeon: Jolaine Artist, MD;  Location: [facility];  Service: Cardiovascular;  Laterality: N/A;  . THORACOTOMY Left 07/08/2019   Procedure: THORACOTOMY MAJOR;  Surgeon: Wonda Olds, MD;  Location: [facility];  Service: Thoracic;  Laterality: Left;  . THORACOTOMY Left 07/13/2019   Procedure: CLOSURE OF THORACOTOMY;  Surgeon: Wonda Olds, MD;  Location: [facility];  Service: Thoracic;  Laterality: Left;  . TONSILLECTOMY    . TUBAL LIGATION    . VASCULAR ASSESSMENT WITH SPY ELITE Left 07/13/2019   Procedure: Vascular Assessment With Spy Elite;  Surgeon: Wonda Olds, MD;  Location: [facility] OR;  Service: Thoracic;  Laterality: Left;    Social History   Socioeconomic History  . Marital status: Married    Spouse name: Not on file  . Number of children: Not on file  . Years of education: Not on file  . Highest education level: Not on file  Occupational History  . Not on file  Tobacco Use  . Smoking status: Never Smoker  . Smokeless tobacco: Never Used  Substance and Sexual Activity  . Alcohol use: Not Currently  . Drug use: Never  . Sexual activity: Not on file  Other Topics Concern  . Not on file  Social History Narrative  . Not on file   Social Determinants of Health   Financial Resource Strain:   . Difficulty of Paying Living Expenses:   Food Insecurity:   . Worried About Charity fundraiser in the Last Year:   . Arboriculturist in the Last Year:   Transportation Needs:    . Film/video editor (Medical):   Marland Kitchen Lack of Transportation (Non-Medical):   Physical Activity:   . Days of Exercise per Week:   . Minutes of Exercise per Session:   Stress:   . Feeling of Stress :   Social Connections:   . Frequency of Communication with Friends and Family:   . Frequency of Social Gatherings with Friends and Family:   . Attends Religious Services:   . Active Member of Clubs or Organizations:   . Attends Archivist Meetings:   Marland Kitchen Marital Status:   Intimate Partner Violence:   . Fear of Current or Ex-Partner:   . Emotionally Abused:   .  Physically Abused:   . Sexually Abused:      Allergies  Allergen Reactions  . Codeine Nausea And Vomiting  . Gluten Meal Diarrhea    Abdominal pain and bloating  . Lactose Diarrhea    Bloating and abdominal pain  . Neosporin [Neomycin-Bacitracin Zn-Polymyx]   . Other     Pain medication pt. Cannot remember the name.     Immunization History  Administered Date(s) Administered  . Fluad Quad(high Dose 65+) 03/05/2019  . PFIZER SARS-COV-2 Vaccination 06/12/2019, 07/03/2019    Outpatient Medications Prior to Visit  Medication Sig Dispense Refill  . acetaminophen (TYLENOL) 500 MG tablet Take 500 mg by mouth every 6 (six) hours as needed for mild pain, fever or headache.     Marland Kitchen amiodarone (PACERONE) 200 MG tablet Take 1 tablet (200 mg total) by mouth 2 (two) times daily. 60 tablet 0  . celecoxib (CELEBREX) 200 MG capsule Take 1 capsule (200 mg total) by mouth daily.  3  . furosemide (LASIX) 20 MG tablet TAKE 1 TABLET BY MOUTH EVERY DAY 30 tablet 0  . Hydrocortisone (GERHARDT'S BUTT CREAM) CREA Apply 1 application topically 2 (two) times daily.    . montelukast (SINGULAIR) 10 MG tablet Take 1 tablet (10 mg total) by mouth daily.    . Multiple Vitamin (MULTIVITAMIN WITH MINERALS) TABS tablet Take 1 tablet by mouth daily.    . SYMBICORT 80-4.5 MCG/ACT inhaler Inhale 2 puffs into the lungs in the morning and at  bedtime.    . levalbuterol (XOPENEX) 0.63 MG/3ML nebulizer solution Take 3 mLs (0.63 mg total) by nebulization every 6 (six) hours as needed for wheezing or shortness of breath. (Patient not taking: Reported on 09/03/2019) 3 mL 12  . apixaban (ELIQUIS) 5 MG TABS tablet Take 1 tablet (5 mg total) by mouth 2 (two) times daily. 60 tablet 0  . bisacodyl (DULCOLAX) 5 MG EC tablet Take 2 tablets (10 mg total) by mouth daily as needed for moderate constipation. 30 tablet 0  . colchicine 0.6 MG tablet Take 0.5 tablets (0.3 mg total) by mouth daily. 15 tablet 0  . ferrous [id number] C-folic acid (TRINSICON / FOLTRIN) capsule Take 1 capsule by mouth 2 (two) times daily after a meal. 60 capsule 0  . loperamide HCl (IMODIUM) 1 MG/7.5ML suspension Take 7.5 mLs (1 mg total) by mouth as needed for diarrhea or loose stools. 120 mL 0  . mometasone-formoterol (DULERA) 100-5 MCG/ACT AERO Inhale 2 puffs into the lungs 2 (two) times daily. Once this is used up ---you can resume symbicort    . pantoprazole (PROTONIX) 40 MG tablet TAKE 1 TABLET BY MOUTH EVERY DAY 30 tablet 0  . traZODone (DESYREL) 50 MG tablet Take 0.5 tablets (25 mg total) by mouth at bedtime as needed for sleep. 30 tablet 0   No facility-administered medications prior to visit.    Review of Systems  Constitutional: Positive for weight loss. Negative for chills and fever.  Respiratory: Negative for cough, sputum production, shortness of breath and wheezing.   Cardiovascular: Positive for chest pain and leg swelling.  Gastrointestinal: Negative for abdominal pain, heartburn, nausea and vomiting.     Objective:   Vitals:   09/03/19 1413 09/03/19 1442 09/03/19 1443  BP: 122/87    Pulse: 68    Temp: (!) 97.1 F (36.2 C)    TempSrc: Temporal    SpO2: (!) 86% 97% 97%  Weight: 125 lb 9.6 oz (57 kg)    Height: 5' (1.524 m)  97% on   RA BMI Readings from Last 3 Encounters:  09/03/19 24.53 kg/m  08/23/19 3543.51 kg/m  08/13/19  25.79 kg/m   Wt Readings from Last 3 Encounters:  09/03/19 125 lb 9.6 oz (57 kg)  08/23/19 126 lb (57.2 kg)  08/13/19 126 lb 9.6 oz (57.4 kg)    Physical Exam Vitals reviewed.  Constitutional:      General: She is not in acute distress. HENT:     Head: Normocephalic and atraumatic.  Eyes:     General: No scleral icterus. Cardiovascular:     Rate and Rhythm: Normal rate and regular rhythm.     Heart sounds: No murmur.  Abdominal:     General: There is no distension.     Palpations: Abdomen is soft.     Tenderness: There is no abdominal tenderness.     Comments: Close visibly baggy from weight loss  Musculoskeletal:        General: No deformity.     Cervical back: Neck supple.     Comments: Symmetric lower extremity edema to her shins  Lymphadenopathy:     Cervical: No cervical adenopathy.  Skin:    General: Skin is warm and dry.     Findings: No rash.     Comments: Well-healed abdominal, left thoracotomy and chest tube scars.  Neurological:     Mental Status: She is alert.     Coordination: Coordination normal.     Comments: Ambulates with walker  Psychiatric:        Mood and Affect: Mood normal.        Behavior: Behavior normal.     Comments: Very interactive      CBC    Component Value Date/Time   WBC 6.7 07/28/2019 0905   RBC 2.98 (L) 07/28/2019 0905   HGB 9.1 (L) 07/28/2019 0905   HCT 29.4 (L) 07/28/2019 0905   PLT 270 07/28/2019 0905   MCV 98.7 07/28/2019 0905   MCH 30.5 07/28/2019 0905   MCHC 31.0 07/28/2019 0905   RDW 21.7 (H) 07/28/2019 0905   LYMPHSABS 0.6 (L) 07/28/2019 0905   MONOABS 0.8 07/28/2019 0905   EOSABS 0.3 07/28/2019 0905   BASOSABS 0.0 07/28/2019 0905    CHEMISTRY No results for input(s): NA, K, CL, CO2, GLUCOSE, BUN, CREATININE, CALCIUM, MG, PHOS in the last 168 hours. CrCl cannot be calculated (Patient's most recent lab result is older than the maximum 21 days allowed.).   Chest Imaging- films reviewed: CXR, 2 view  08/09/2019- Left-sided small bore chest tube in place, persistent opacification of left lower hemithorax with likely pleural effusion.  Enlarged right hilum.   Pulmonary Functions Testing Results: No flowsheet data found.    Echocardiogram 07/08/19: LVEF 60 to 123456, grade 2 diastolic dysfunction. Normal LA.  Normal RV function.  Moderately elevated PASP.  Mild to moderate MR and TR.  Trivial AR.  Heart Catheterization 07/08/19:    Prox Cx to Mid Cx lesion is 20% stenosed.   Findings:  Ao = 104/46 (72)  LV =  90/14 RA =  7 RV = 47/8 PA = 50/9 (21) PCW = 19 Fick cardiac output/index = 8.8/5.2 PVR = < 1.0 WU Ao sat = 100 PA sat = 74%, 75%  Assessment: 1. LVEF 60-65% 2. Minimal CAD 3. Very mild PAH  Plan/Discussion:  Ok to proceed with thoracotomy for very large left-sided empyema.    Assessment & Plan:     ICD-10-CM   1. Chronic respiratory failure with  hypoxia ([place])  J96.11 Pulmonary function test    Pulse oximetry, overnight     Chronic respiratory failure with hypoxia.  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 -Requalified for oxygen today in the office -Overnight oximetry to quantify nocturnal oxygen requirements -PFTs -Goal is to prevent future pneumonias.  Up-to-date on pneumonia, flu, & Covid vaccines. -Encouraged her to remain active and continue with her rehabilitation efforts.  Asthma -Continue Symbicort twice daily -Albuterol as needed -Will reassess at follow-up if she would be eligible to de-escalate her inhaler regimen.  Questionable history of pulmonary hypertension-no signs of decompensated right heart failure currently and unconvincing right heart catheterization in February. -If clinical concern for recurrent PAH develops, will pursue repeat echocardiography and right heart cath.   RTC in 2 months.   Current Outpatient Medications:  .  acetaminophen (TYLENOL) 500 MG tablet, Take 500 mg by mouth every 6 (six)  hours as needed for mild pain, fever or headache. , Disp: , Rfl:  .  amiodarone (PACERONE) 200 MG tablet, Take 1 tablet (200 mg total) by mouth 2 (two) times daily., Disp: 60 tablet, Rfl: 0 .  celecoxib (CELEBREX) 200 MG capsule, Take 1 capsule (200 mg total) by mouth daily., Disp: , Rfl: 3 .  furosemide (LASIX) 20 MG tablet, TAKE 1 TABLET BY MOUTH EVERY DAY, Disp: 30 tablet, Rfl: 0 .  Hydrocortisone (GERHARDT'S BUTT CREAM) CREA, Apply 1 application topically 2 (two) times daily., Disp: , Rfl:  .  montelukast (SINGULAIR) 10 MG tablet, Take 1 tablet (10 mg total) by mouth daily., Disp:  , Rfl:  .  Multiple Vitamin (MULTIVITAMIN WITH MINERALS) TABS tablet, Take 1 tablet by mouth daily., Disp:  , Rfl:  .  SYMBICORT 80-4.5 MCG/ACT inhaler, Inhale 2 puffs into the lungs in the morning and at bedtime., Disp: , Rfl:  .  levalbuterol (XOPENEX) 0.63 MG/3ML nebulizer solution, Take 3 mLs (0.63 mg total) by nebulization every 6 (six) hours as needed for wheezing or shortness of breath. (Patient not taking: Reported on 09/03/2019), Disp: 3 mL, Rfl: Pixley Naftali Carchi, DO [facility] 09/03/2019 7:34 PM

## 2019-09-05 DIAGNOSIS — I4891 Unspecified atrial fibrillation: Secondary | ICD-10-CM | POA: Diagnosis not present

## 2019-09-05 DIAGNOSIS — Z48813 Encounter for surgical aftercare following surgery on the respiratory system: Secondary | ICD-10-CM | POA: Diagnosis not present

## 2019-09-05 DIAGNOSIS — M1990 Unspecified osteoarthritis, unspecified site: Secondary | ICD-10-CM | POA: Diagnosis not present

## 2019-09-05 DIAGNOSIS — Z79899 Other long term (current) drug therapy: Secondary | ICD-10-CM | POA: Diagnosis not present

## 2019-09-05 DIAGNOSIS — Z791 Long term (current) use of non-steroidal anti-inflammatories (NSAID): Secondary | ICD-10-CM | POA: Diagnosis not present

## 2019-09-05 DIAGNOSIS — K219 Gastro-esophageal reflux disease without esophagitis: Secondary | ICD-10-CM | POA: Diagnosis not present

## 2019-09-05 DIAGNOSIS — Z9981 Dependence on supplemental oxygen: Secondary | ICD-10-CM | POA: Diagnosis not present

## 2019-09-05 DIAGNOSIS — I10 Essential (primary) hypertension: Secondary | ICD-10-CM | POA: Diagnosis not present

## 2019-09-05 DIAGNOSIS — I2721 Secondary pulmonary arterial hypertension: Secondary | ICD-10-CM | POA: Diagnosis not present

## 2019-09-05 DIAGNOSIS — J869 Pyothorax without fistula: Secondary | ICD-10-CM | POA: Diagnosis not present

## 2019-09-05 DIAGNOSIS — E440 Moderate protein-calorie malnutrition: Secondary | ICD-10-CM | POA: Diagnosis not present

## 2019-09-05 DIAGNOSIS — J449 Chronic obstructive pulmonary disease, unspecified: Secondary | ICD-10-CM | POA: Diagnosis not present

## 2019-09-05 DIAGNOSIS — Z792 Long term (current) use of antibiotics: Secondary | ICD-10-CM | POA: Diagnosis not present

## 2019-09-05 DIAGNOSIS — Z7902 Long term (current) use of antithrombotics/antiplatelets: Secondary | ICD-10-CM | POA: Diagnosis not present

## 2019-09-05 DIAGNOSIS — J9620 Acute and chronic respiratory failure, unspecified whether with hypoxia or hypercapnia: Secondary | ICD-10-CM | POA: Diagnosis not present

## 2019-09-05 DIAGNOSIS — Z7952 Long term (current) use of systemic steroids: Secondary | ICD-10-CM | POA: Diagnosis not present

## 2019-09-05 DIAGNOSIS — D62 Acute posthemorrhagic anemia: Secondary | ICD-10-CM | POA: Diagnosis not present

## 2019-09-05 DIAGNOSIS — Z7982 Long term (current) use of aspirin: Secondary | ICD-10-CM | POA: Diagnosis not present

## 2019-09-05 DIAGNOSIS — R0902 Hypoxemia: Secondary | ICD-10-CM | POA: Diagnosis not present

## 2019-09-05 DIAGNOSIS — F329 Major depressive disorder, single episode, unspecified: Secondary | ICD-10-CM | POA: Diagnosis not present

## 2019-09-05 DIAGNOSIS — J441 Chronic obstructive pulmonary disease with (acute) exacerbation: Secondary | ICD-10-CM | POA: Diagnosis not present

## 2019-09-06 DIAGNOSIS — F329 Major depressive disorder, single episode, unspecified: Secondary | ICD-10-CM | POA: Diagnosis not present

## 2019-09-06 DIAGNOSIS — D62 Acute posthemorrhagic anemia: Secondary | ICD-10-CM | POA: Diagnosis not present

## 2019-09-06 DIAGNOSIS — J869 Pyothorax without fistula: Secondary | ICD-10-CM | POA: Diagnosis not present

## 2019-09-06 DIAGNOSIS — Z48813 Encounter for surgical aftercare following surgery on the respiratory system: Secondary | ICD-10-CM | POA: Diagnosis not present

## 2019-09-06 DIAGNOSIS — E440 Moderate protein-calorie malnutrition: Secondary | ICD-10-CM | POA: Diagnosis not present

## 2019-09-06 DIAGNOSIS — I2721 Secondary pulmonary arterial hypertension: Secondary | ICD-10-CM | POA: Diagnosis not present

## 2019-09-07 ENCOUNTER — Telehealth: Payer: Self-pay | Admitting: Critical Care Medicine

## 2019-09-07 NOTE — Telephone Encounter (Signed)
Spoke with pt, she is wanting to know if she should wear her oxygen for overnight oximetry test. The DME just dropped off the equipment and advised her not to use her oxygen. She thought Dr. Carlis Abbott wanted her to use her oxygen with the test to make sure she is getting enough oxygen at night. Dr. Carlis Abbott please clarify.

## 2019-09-07 NOTE — Telephone Encounter (Signed)
Yes she should wear her oxygen. She has already qualified during the day. Thanks!  LPC

## 2019-09-08 DIAGNOSIS — Z48813 Encounter for surgical aftercare following surgery on the respiratory system: Secondary | ICD-10-CM | POA: Diagnosis not present

## 2019-09-08 DIAGNOSIS — E440 Moderate protein-calorie malnutrition: Secondary | ICD-10-CM | POA: Diagnosis not present

## 2019-09-08 DIAGNOSIS — D62 Acute posthemorrhagic anemia: Secondary | ICD-10-CM | POA: Diagnosis not present

## 2019-09-08 DIAGNOSIS — J869 Pyothorax without fistula: Secondary | ICD-10-CM | POA: Diagnosis not present

## 2019-09-08 DIAGNOSIS — F329 Major depressive disorder, single episode, unspecified: Secondary | ICD-10-CM | POA: Diagnosis not present

## 2019-09-08 DIAGNOSIS — I2721 Secondary pulmonary arterial hypertension: Secondary | ICD-10-CM | POA: Diagnosis not present

## 2019-09-08 NOTE — Telephone Encounter (Signed)
I spoke with pt, she didn't wear the oxygen for her test last night. We can do another ONO after we see the results from this test. She stated she kept checking it during the night and didn't see her oxygen go under 90%. FYI Dr. Carlis Abbott.

## 2019-09-08 NOTE — Telephone Encounter (Signed)
Thanks!  LPC 

## 2019-09-09 ENCOUNTER — Telehealth: Payer: Self-pay | Admitting: Critical Care Medicine

## 2019-09-09 DIAGNOSIS — R0902 Hypoxemia: Secondary | ICD-10-CM | POA: Diagnosis not present

## 2019-09-09 NOTE — Telephone Encounter (Signed)
Overnight oximetry results reviewed: 106 minutes spent less than 88% saturation.  Lowest saturation 81% the study qualifies the patient for supplemental oxygen.  Recommend 2 L whenever sleeping.  Julian Hy, DO 09/09/19 4:51 PM [facility]

## 2019-09-09 NOTE — Telephone Encounter (Signed)
Called and spoke with patient and told her that she requires oxygen she she sleeps. Patient states that she wears oxygen 24/7 already.  Looking at notes and talking to Dr. Carlis Abbott looks like this was not supposed to be done to qualify patient for oxygen it was supposed to be done with her wearing her oxygen to make sure 2 liters was what she needed. This test was not performed with her oxygen like requested I believe. Dr. Carlis Abbott and patient aware nothing further needed at this time.

## 2019-09-15 DIAGNOSIS — F329 Major depressive disorder, single episode, unspecified: Secondary | ICD-10-CM | POA: Diagnosis not present

## 2019-09-15 DIAGNOSIS — D62 Acute posthemorrhagic anemia: Secondary | ICD-10-CM | POA: Diagnosis not present

## 2019-09-15 DIAGNOSIS — E440 Moderate protein-calorie malnutrition: Secondary | ICD-10-CM | POA: Diagnosis not present

## 2019-09-15 DIAGNOSIS — I2721 Secondary pulmonary arterial hypertension: Secondary | ICD-10-CM | POA: Diagnosis not present

## 2019-09-15 DIAGNOSIS — J869 Pyothorax without fistula: Secondary | ICD-10-CM | POA: Diagnosis not present

## 2019-09-15 DIAGNOSIS — Z48813 Encounter for surgical aftercare following surgery on the respiratory system: Secondary | ICD-10-CM | POA: Diagnosis not present

## 2019-09-28 DIAGNOSIS — I2721 Secondary pulmonary arterial hypertension: Secondary | ICD-10-CM | POA: Diagnosis not present

## 2019-09-28 DIAGNOSIS — Z48813 Encounter for surgical aftercare following surgery on the respiratory system: Secondary | ICD-10-CM | POA: Diagnosis not present

## 2019-09-28 DIAGNOSIS — J869 Pyothorax without fistula: Secondary | ICD-10-CM | POA: Diagnosis not present

## 2019-09-28 DIAGNOSIS — D62 Acute posthemorrhagic anemia: Secondary | ICD-10-CM | POA: Diagnosis not present

## 2019-09-28 DIAGNOSIS — F329 Major depressive disorder, single episode, unspecified: Secondary | ICD-10-CM | POA: Diagnosis not present

## 2019-09-28 DIAGNOSIS — E440 Moderate protein-calorie malnutrition: Secondary | ICD-10-CM | POA: Diagnosis not present

## 2019-10-02 ENCOUNTER — Other Ambulatory Visit: Payer: Self-pay | Admitting: Physical Medicine and Rehabilitation

## 2019-10-18 LAB — ACID FAST CULTURE WITH REFLEXED SENSITIVITIES (MYCOBACTERIA): Acid Fast Culture: NEGATIVE

## 2019-11-01 DIAGNOSIS — Z1231 Encounter for screening mammogram for malignant neoplasm of breast: Secondary | ICD-10-CM | POA: Diagnosis not present

## 2019-11-02 DIAGNOSIS — Z1152 Encounter for screening for COVID-19: Secondary | ICD-10-CM | POA: Diagnosis not present

## 2019-11-02 DIAGNOSIS — Z20828 Contact with and (suspected) exposure to other viral communicable diseases: Secondary | ICD-10-CM | POA: Diagnosis not present

## 2019-11-04 ENCOUNTER — Other Ambulatory Visit: Payer: Self-pay

## 2019-11-04 ENCOUNTER — Ambulatory Visit: Payer: Medicare Other | Admitting: Pulmonary Disease

## 2019-11-04 ENCOUNTER — Ambulatory Visit (INDEPENDENT_AMBULATORY_CARE_PROVIDER_SITE_OTHER): Payer: Medicare Other | Admitting: Critical Care Medicine

## 2019-11-04 DIAGNOSIS — J9611 Chronic respiratory failure with hypoxia: Secondary | ICD-10-CM | POA: Diagnosis not present

## 2019-11-04 LAB — PULMONARY FUNCTION TEST
DL/VA % pred: 76 %
DL/VA: 3.22 ml/min/mmHg/L
DLCO cor % pred: 41 %
DLCO cor: 6.84 ml/min/mmHg
DLCO unc % pred: 41 %
DLCO unc: 6.84 ml/min/mmHg
FEF 25-75 Post: 0.47 L/sec
FEF 25-75 Pre: 0.3 L/sec
FEF2575-%Change-Post: 54 %
FEF2575-%Pred-Post: 34 %
FEF2575-%Pred-Pre: 22 %
FEV1-%Change-Post: 16 %
FEV1-%Pred-Post: 48 %
FEV1-%Pred-Pre: 41 %
FEV1-Post: 0.82 L
FEV1-Pre: 0.7 L
FEV1FVC-%Change-Post: 4 %
FEV1FVC-%Pred-Pre: 78 %
FEV6-%Change-Post: 10 %
FEV6-%Pred-Post: 62 %
FEV6-%Pred-Pre: 56 %
FEV6-Post: 1.33 L
FEV6-Pre: 1.2 L
FEV6FVC-%Change-Post: -1 %
FEV6FVC-%Pred-Post: 104 %
FEV6FVC-%Pred-Pre: 105 %
FVC-%Change-Post: 11 %
FVC-%Pred-Post: 59 %
FVC-%Pred-Pre: 53 %
FVC-Post: 1.35 L
FVC-Pre: 1.21 L
Post FEV1/FVC ratio: 61 %
Post FEV6/FVC ratio: 98 %
Pre FEV1/FVC ratio: 58 %
Pre FEV6/FVC Ratio: 100 %
RV % pred: 106 %
RV: 2.28 L
TLC % pred: 83 %
TLC: 3.7 L

## 2019-11-04 NOTE — Progress Notes (Signed)
Full PFT performed today. °

## 2019-11-10 ENCOUNTER — Ambulatory Visit: Payer: Medicare Other | Admitting: Critical Care Medicine

## 2019-11-10 NOTE — Progress Notes (Deleted)
Synopsis: Referred in April 2021 for hypoxia by Ronita Hipps, MD  Subjective:   PATIENT ID: Connie Jarvis GENDER: female DOB: 11-10-1940, MRN: [id number]  No chief complaint on file.   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  Lowest saturation 81% the study qualifies the patient for supplemental oxygen.  Recommend 2 L whenever sleeping.   Symbicort Singulair Xopenex   OV 09/03/19: Connie Jarvis is a 79 year old woman who presents for evaluation of chronic hypoxic respiratory failure.  She is accompanied today by her husband.  She is formerly a patient of Dr. Carren Rang in [facility], but wishes to transfer her care.  She has chronic hypoxic respiratory failure since 2020.  She did not originally use the 2 L of supplemental oxygen that was prescribed, but she began using it after a hospital admission for pneumonia around Labor Day 2020.  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 She underwent a thoracotomy on 2/10, and subsequently developed a wound infection requiring an omental flap.  After discharge she was able to go to inpatient rehab, where she has progressively improved.  She is feeling well and was wondering if she still needed her supplemental oxygen.  When tested at the beginning of her visit she had desaturations associated with mild chest discomfort which was relieved by replacing her oxygen.  She has had asthma her entire life and was on Symbicort prior to admission.  She was diagnosed with pulmonary hypertension in 1999 and was on sildenafil prior to her hospital admission.  She underwent right heart cath with PVR <1 WU prior to thoracotomy.  She was discharged without any pulmonary vasodilators.  She has  minimal chronic leg edema, which is much improved compared to prior to her February hospitalization.  Her edema worsens throughout the day and improves with her legs propped up.  She denies wheezing, sputum production, cough, chest pain, or dizziness.  She overall feels that she is doing well.  No history of tobacco use.  No family history of lung disease. 08/23/2019 [facility] note reviewed.  Hospital discharge summary reviewed.    Past Medical History:  Diagnosis Date  . Allergy   . Anemia   . Anxiety   . Arthritis   . Cataract    pt. not sure which eye early stage  . Depression   . GERD (gastroesophageal reflux disease)   . Hypertension      Family History  Problem Relation Age of Onset  . Osteoporosis Mother   . Parkinson's disease Mother   . CVA Father   . Heart attack Father   . Lung cancer Paternal Grandfather   . Colon cancer Neg Hx   . Colon polyps Neg Hx   . Esophageal cancer Neg Hx   . Pancreatic cancer Neg Hx   . Rectal cancer Neg Hx   . Stomach cancer Neg Hx      Past Surgical History:  Procedure Laterality Date  . APPLICATION OF WOUND VAC Left 07/08/2019   Procedure: APPLICATION OF WOUND VAC;  Surgeon: Wonda Olds, MD;  Location: [facility] OR;  Service: Thoracic;  Laterality: Left;  . APPLICATION OF WOUND VAC Left 07/11/2019   Procedure: WOUND VAC CHANGE AND WOUND IRRIGATION AND SHARP DEBRIDEMENT;  Surgeon: Wonda Olds, MD;  Location: [facility] OR;  Service: Thoracic;  Laterality: Left;  . APPLICATION OF WOUND VAC Left 07/13/2019   Procedure: APPLICATION OF WOUND VAC USING PREVENA DRESSING;  Surgeon: Wonda Olds, MD;  Location: [facility] OR;  Service: Thoracic;  Laterality: Left;  . CENTRAL LINE INSERTION Left 07/08/2019   Procedure: CENTRAL LINE INSERTION;  Surgeon: Jolaine Artist, MD;  Location: [facility];  Service: Cardiovascular;  Laterality: Left;  . CHEST TUBE INSERTION Right 07/13/2019   Procedure: Chest Tube Insertion;  Surgeon: Wonda Olds,  MD;  Location: [facility] OR;  Service: Thoracic;  Laterality: Right;  . COLONOSCOPY    . EMPYEMA DRAINAGE Left 07/08/2019   Procedure: EMPYEMA DRAINAGE;  Surgeon: Wonda Olds, MD;  Location: [facility] OR;  Service: Thoracic;  Laterality: Left;  . GREATER OMENTAL FLAP CLOSURE Left 07/13/2019   Procedure: GREATER OMENTAL FLAP CLOSURE;  Surgeon: Wonda Olds, MD;  Location: [facility] OR;  Service: Thoracic;  Laterality: Left;  . MOUTH SURGERY    . RIGHT/LEFT HEART CATH AND CORONARY ANGIOGRAPHY N/A 07/08/2019   Procedure: RIGHT/LEFT HEART CATH AND CORONARY ANGIOGRAPHY;  Surgeon: Jolaine Artist, MD;  Location: [facility];  Service: Cardiovascular;  Laterality: N/A;  . THORACOTOMY Left 07/08/2019   Procedure: THORACOTOMY MAJOR;  Surgeon: Wonda Olds, MD;  Location: [facility] OR;  Service: Thoracic;  Laterality: Left;  . THORACOTOMY Left 07/13/2019   Procedure: CLOSURE OF THORACOTOMY;  Surgeon: Wonda Olds, MD;  Location: [facility] OR;  Service: Thoracic;  Laterality: Left;  . TONSILLECTOMY    . TUBAL LIGATION    . VASCULAR ASSESSMENT WITH SPY ELITE Left 07/13/2019   Procedure: Vascular Assessment With Spy Elite;  Surgeon: Wonda Olds, MD;  Location: [facility] OR;  Service: Thoracic;  Laterality: Left;    Social History   Socioeconomic History  . Marital status: Married    Spouse name: Not on file  . Number of children: Not on file  . Years of education: Not on file  . Highest education level: Not on file  Occupational History  . Not on file  Tobacco Use  . Smoking status: Never Smoker  . Smokeless tobacco: Never Used  Vaping Use  . Vaping Use: Never used  Substance and Sexual Activity  . Alcohol use: Not Currently  . Drug use: Never  . Sexual activity: Not on file  Other Topics Concern  . Not on file  Social History Narrative  . Not on file   Social Determinants of Health   Financial Resource Strain:   . Difficulty of Paying Living Expenses:   Food Insecurity:   . Worried About  Charity fundraiser in the Last Year:   . Arboriculturist in the Last Year:   Transportation Needs:   . Film/video editor (Medical):   Marland Kitchen Lack of Transportation (Non-Medical):   Physical Activity:   . Days of Exercise per Week:   . Minutes of Exercise per Session:   Stress:   . Feeling of Stress :   Social Connections:   . Frequency of Communication with Friends and Family:   . Frequency of Social Gatherings with Friends and Family:   . Attends Religious Services:   . Active Member of Clubs or Organizations:   . Attends Archivist Meetings:   Marland Kitchen Marital Status:   Intimate Partner Violence:   . Fear of Current or Ex-Partner:   . Emotionally Abused:   .  Physically Abused:   . Sexually Abused:      Allergies  Allergen Reactions  . Codeine Nausea And Vomiting  . Gluten Meal Diarrhea    Abdominal pain and bloating  . Lactose Diarrhea    Bloating and abdominal pain  . Neosporin [Neomycin-Bacitracin Zn-Polymyx]   . Other     Pain medication pt. Cannot remember the name.     Immunization History  Administered Date(s) Administered  . Fluad Quad(high Dose 65+) 03/05/2019  . PFIZER SARS-COV-2 Vaccination 06/12/2019, 07/03/2019    Outpatient Medications Prior to Visit  Medication Sig Dispense Refill  . acetaminophen (TYLENOL) 500 MG tablet Take 500 mg by mouth every 6 (six) hours as needed for mild pain, fever or headache.     Marland Kitchen amiodarone (PACERONE) 200 MG tablet Take 1 tablet (200 mg total) by mouth 2 (two) times daily. 60 tablet 0  . celecoxib (CELEBREX) 200 MG capsule Take 1 capsule (200 mg total) by mouth daily.  3  . furosemide (LASIX) 20 MG tablet TAKE 1 TABLET BY MOUTH EVERY DAY 30 tablet 0  . Hydrocortisone (GERHARDT'S BUTT CREAM) CREA Apply 1 application topically 2 (two) times daily.    Marland Kitchen levalbuterol (XOPENEX) 0.63 MG/3ML nebulizer solution Take 3 mLs (0.63 mg total) by nebulization every 6 (six) hours as needed for wheezing or shortness of breath.  (Patient not taking: Reported on 09/03/2019) 3 mL 12  . montelukast (SINGULAIR) 10 MG tablet Take 1 tablet (10 mg total) by mouth daily.    . Multiple Vitamin (MULTIVITAMIN WITH MINERALS) TABS tablet Take 1 tablet by mouth daily.    . SYMBICORT 80-4.5 MCG/ACT inhaler Inhale 2 puffs into the lungs in the morning and at bedtime.     No facility-administered medications prior to visit.    Review of Systems  Constitutional: Positive for weight loss. Negative for chills and fever.  Respiratory: Negative for cough, sputum production, shortness of breath and wheezing.   Cardiovascular: Positive for chest pain and leg swelling.  Gastrointestinal: Negative for abdominal pain, heartburn, nausea and vomiting.     Objective:   There were no vitals filed for this visit.   on   RA BMI Readings from Last 3 Encounters:  09/03/19 24.53 kg/m  08/23/19 3543.51 kg/m  08/13/19 25.79 kg/m   Wt Readings from Last 3 Encounters:  09/03/19 125 lb 9.6 oz (57 kg)  08/23/19 126 lb (57.2 kg)  08/13/19 126 lb 9.6 oz (57.4 kg)    Physical Exam   CBC    Component Value Date/Time   WBC 6.7 07/28/2019 0905   RBC 2.98 (L) 07/28/2019 0905   HGB 9.1 (L) 07/28/2019 0905   HCT 29.4 (L) 07/28/2019 0905   PLT 270 07/28/2019 0905   MCV 98.7 07/28/2019 0905   MCH 30.5 07/28/2019 0905   MCHC 31.0 07/28/2019 0905   RDW 21.7 (H) 07/28/2019 0905   LYMPHSABS 0.6 (L) 07/28/2019 0905   MONOABS 0.8 07/28/2019 0905   EOSABS 0.3 07/28/2019 0905   BASOSABS 0.0 07/28/2019 0905    CHEMISTRY No results for input(s): NA, K, CL, CO2, GLUCOSE, BUN, CREATININE, CALCIUM, MG, PHOS in the last 168 hours. CrCl cannot be calculated (Patient's most recent lab result is older than the maximum 21 days allowed.).   Chest Imaging- films reviewed: CXR, 2 view 08/09/2019- Left-sided small bore chest tube in place, persistent opacification of left lower hemithorax with likely pleural effusion.  Enlarged right hilum.   Pulmonary  Functions Testing Results: PFT Results Latest  Ref Rng & Units 11/04/2019  FVC-Pre L 1.21  FVC-Predicted Pre % 53  FVC-Post L 1.35  FVC-Predicted Post % 59  Pre FEV1/FVC % % 58  Post FEV1/FCV % % 61  FEV1-Pre L 0.70  FEV1-Predicted Pre % 41  FEV1-Post L 0.82  DLCO UNC% % 41  DLCO COR %Predicted % 76  TLC L 3.70  TLC % Predicted % 83  RV % Predicted % 106   2021- severe obstruction with significant bronchodilator reversibility.  No hyperinflation, air trapping, or restriction.  Moderately impaired diffusion.  Overnight oximetry results reviewed: 106 minutes spent less than 88% saturation.  Lowest saturation 81% the study qualifies the patient for supplemental oxygen.  Recommend 2 L whenever sleeping.   Echocardiogram 07/08/19: LVEF 60 to 94%, grade 2 diastolic dysfunction. Normal LA.  Normal RV function.  Moderately elevated PASP.  Mild to moderate MR and TR.  Trivial AR.  Heart Catheterization 07/08/19:    Prox Cx to Mid Cx lesion is 20% stenosed.   Findings:  Ao = 104/46 (72)  LV =  90/14 RA =  7 RV = 47/8 PA = 50/9 (21) PCW = 19 Fick cardiac output/index = 8.8/5.2 PVR = < 1.0 WU Ao sat = 100 PA sat = 74%, 75%  Assessment: 1. LVEF 60-65% 2. Minimal CAD 3. Very mild PAH     Assessment & Plan:   No diagnosis found.   Chronic respiratory failure with hypoxia.  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 -Requalified for oxygen today in the office -Overnight oximetry to quantify nocturnal oxygen requirements -PFTs -Goal is to prevent future pneumonias.  Up-to-date on pneumonia, flu, & Covid vaccines. -Encouraged her to remain active and continue with her rehabilitation efforts.  Asthma -Continue Symbicort twice daily -Albuterol as needed -Will reassess at follow-up if she would be eligible to de-escalate her inhaler regimen.  Questionable history of pulmonary hypertension-no signs of decompensated right heart failure currently and  unconvincing right heart catheterization in February. -If clinical concern for recurrent PAH develops, will pursue repeat echocardiography and right heart cath.   RTC in 2 months.   Current Outpatient Medications:  .  acetaminophen (TYLENOL) 500 MG tablet, Take 500 mg by mouth every 6 (six) hours as needed for mild pain, fever or headache. , Disp: , Rfl:  .  amiodarone (PACERONE) 200 MG tablet, Take 1 tablet (200 mg total) by mouth 2 (two) times daily., Disp: 60 tablet, Rfl: 0 .  celecoxib (CELEBREX) 200 MG capsule, Take 1 capsule (200 mg total) by mouth daily., Disp: , Rfl: 3 .  furosemide (LASIX) 20 MG tablet, TAKE 1 TABLET BY MOUTH EVERY DAY, Disp: 30 tablet, Rfl: 0 .  Hydrocortisone (GERHARDT'S BUTT CREAM) CREA, Apply 1 application topically 2 (two) times daily., Disp: , Rfl:  .  levalbuterol (XOPENEX) 0.63 MG/3ML nebulizer solution, Take 3 mLs (0.63 mg total) by nebulization every 6 (six) hours as needed for wheezing or shortness of breath. (Patient not taking: Reported on 09/03/2019), Disp: 3 mL, Rfl: 12 .  montelukast (SINGULAIR) 10 MG tablet, Take 1 tablet (10 mg total) by mouth daily., Disp:  , Rfl:  .  Multiple Vitamin (MULTIVITAMIN WITH MINERALS) TABS tablet, Take 1 tablet by mouth daily., Disp:  , Rfl:  .  SYMBICORT 80-4.5 MCG/ACT inhaler, Inhale 2 puffs into the lungs in the morning and at bedtime., Disp: , Rfl:     Julian Hy, DO [facility] 11/10/2019 8:44 AM

## 2019-11-15 ENCOUNTER — Other Ambulatory Visit: Payer: Self-pay

## 2019-11-15 ENCOUNTER — Ambulatory Visit (INDEPENDENT_AMBULATORY_CARE_PROVIDER_SITE_OTHER): Payer: Medicare Other | Admitting: Primary Care

## 2019-11-15 VITALS — BP 118/78 | HR 61 | Temp 98.3°F | Ht 62.0 in | Wt 115.8 lb

## 2019-11-15 DIAGNOSIS — R0902 Hypoxemia: Secondary | ICD-10-CM | POA: Diagnosis not present

## 2019-11-15 DIAGNOSIS — J449 Chronic obstructive pulmonary disease, unspecified: Secondary | ICD-10-CM | POA: Diagnosis not present

## 2019-11-15 DIAGNOSIS — K219 Gastro-esophageal reflux disease without esophagitis: Secondary | ICD-10-CM | POA: Diagnosis not present

## 2019-11-15 MED ORDER — OMEPRAZOLE 20 MG PO CPDR: 20.0000 mg | DELAYED_RELEASE_CAPSULE | Freq: Every day | ORAL | 11 refills | Status: AC

## 2019-11-15 NOTE — Patient Instructions (Addendum)
Pleasure meeting you today Connie Jarvis  Pulmonary function testing showed moderate obstruction/restriction in lung function with positive bronchodilator response (This is indicative of obstructive asthma)  Recommend that you continue Symbicort 80 two puffs twice daily.  Try taking singular 10 mg at bedtime.  We will check an alpha 1 antitrypsin level to ensure that there is not a genetic component contributing to your lung disease  We are going to start you on omeprazole 20 milligrams daily, you should take this 30 to 60 minutes before your first meal of the day on an empty stomach  If you develop worsening lower extremity edema or shortness of breath recommend repeating echocardiogram to monitor pulmonary hypertension  Orders: Alpha 1 level  Follow-up: 3 months with Dr. Carlis Abbott or sooner if needed   Asthma and Physical Activity Physical activity is an important part of a healthy lifestyle. If you have asthma, it is important to exercise because physical activity can help you to:  Control your asthma.  Maintain your weight or lose weight.  Increase your energy.  Decrease stress and anxiety.  Lower your risk of getting sick.  Improve your heart health. However, asthma symptoms can flare up when you are physically active or exercising. You can learn how to control your asthma and prevent symptoms during exercise. This will help you to remain physically active. How can asthma affect my ability to be physically active? When you have asthma, physical activity can cause you to have symptoms such as:  Wheezing. This may sound like whistling while breathing.  A feeling of tightness in the chest.  Sore throat.  Coughing.  Shortness of breath.  Tiredness (fatigue) with minimal activity.  Increased sputum production.  Chest pain. What actions can I take to prevent asthma problems during physical activity? Pulmonary rehabilitation Enroll in a pulmonary rehabilitation program.  Benefits of this type of program include:  Education on lung diseases.  Classes that teach you how to exercise and be more active while decreasing your shortness of breath.  A group setting that allows you to talk with others who have asthma. Asthma action plan Follow the asthma action plan set by your health care provider. Your personal asthma plan may include:  Taking your medicines as told by your health care provider.  Avoiding your asthma triggers, except physical activity. Triggers may include cold air, dust, pollen, pet dander, and air pollution.  Tracking your asthma control.  Using a peak flow meter.  Being aware of worsening symptoms.  Knowing when to seek emergency care. Proper breathing During exercise, follow these tips for proper breathing:  Breathe in before starting the exercise and breathe out during the hardest part of the exercise.  Take slow breaths.  Pace yourself and do not try to go too fast.  While breathing out, purse your lips. Before beginning any exercise program or new activity, talk with your health care provider. Medicines If physical activity triggers your asthma, your health care provider may order the following medicines:  A rescue inhaler (short-acting beta2-agonist) for you to use shortly before physical activity or exercise. Its effects may reduce exercise-related symptoms for 2-3 hours.  A long-acting beta2-agonist that can offer up to 12 hours of relief if taken daily.  Leukotriene modifiers. These pills are taken several hours before physical activity or exercise to help prevent asthma symptoms that are caused by exercise.  Long-term control medicines. These will be given if you have severe or frequent asthma symptoms during or after exercise. These  symptoms may also mean that your asthma is not well controlled.  General information  Exercise indoors when the air is dry or during allergy season.  Try to breathe in warm, moist air  by wearing a scarf over your nose and mouth or breathing only through your nose.  Spend a few minutes warming up before your workout.  Cool down after exercise. What should I do if my asthma symptoms get worse? Contact your health care provider if your asthma symptoms are getting worse. Your asthma is getting worse if:  You have symptoms more often.  Your symptoms are more severe.  Your symptoms get worse at night and make you lose sleep.  Your peak flow number is lower than your personal best or changes a lot from day to day.  Your asthma medicines do not work as well as they used to.  You use your rescue inhaler more often. If you use your rescue inhaler more than 2 days a week, your asthma is not well controlled.  You go to the emergency room or see your health care provider because of an asthma attack. Where to find support  Ask your health care provider about signing up for a pulmonary rehabilitation program.  Ask your health care provider about asthma support groups.  Visit your local community health department.  Check out local hospitals' community health programs. Where can I get more information?  Your health care provider.  American Lung Association: lung.org  National Heart, Lung, and Blood Institute: https://www.[place]man-[place].biz/ Contact a health care provider if:  You have trouble walking and talking because you are out of breath. Get help right away if:  Your lips or fingernails are blue.  You are not able to breathe or catch your breath. Summary  Physical activity is an important part of a healthy lifestyle. However, if you have asthma, your symptoms can flare up during exercise or physical activity.  You can prevent problems during physical activity by doing pulmonary rehabilitation, following an asthma action plan, doing proper breathing, and using medicines.  Talk with your health care provider before starting any exercise program or new activity. This  information is not intended to replace advice given to you by your health care provider. Make sure you discuss any questions you have with your health care provider. Document Revised: 09/01/2018 Document Reviewed: 07/29/2017 Elsevier Patient Education  [place].

## 2019-11-15 NOTE — Progress Notes (Signed)
@Patient  ID: Connie Jarvis, female    DOB: Feb 15, 1941, 79 y.o.   MRN: [id number]  Chief Complaint  Patient presents with  . Follow-up    pt is here to go over pft results.pt is coughing up clear mucus    Referring provider: Ronita Hipps, MD  HPI: 79 year old female, never smoked.  Past medical history significant for COPD/asthma , chronic respiratory failure with hypoxia, GERD, atrial fibrillation, hypertension, pulmonary hypertension.  Patient of Dr. Carlis Jarvis, seen on 09/03/2019.  She underwent a thoracotomy in February and subsequently developed a wound infection requiring repair.  He has had asthma her whole life and was on Symbicort prior to admission.  He was diagnosed with pulmonary hypertension 1999 and was on sildenafil prior to hospitalization.  She underwent a right heart cath with PVR less than 1 WU thoracotomy.  She was discharged without any pulmonary vasodilators.  No family history of lung disease.  11/15/2019 Presents today to review recent pulmonary function testing. She continues taking Symbicort 80 two puffs twice a day. Breathing is alright with oxygen. She occasionally has shortness of breath with activity. She does not have an albuterol rescue inhaler. She has xopenex nebulizer, she has not needed to use this since February. She is taking Singulair 10mg  in the morning. She has been off vasodilators since February. Her breathing is the same off this medication. She has been on 3L oxygen since Labor day 2020.  She is currently on 2L oxygen. She is comfortable on oxygen. She has a pulse oximeter at home but does not regularly monitor her level. She is not needing to use her walker as much as home. She is doing more. Re-qualified for oxygen during last office visit. She is up to date with pneumonia vaccine, flu and covid. She got pneumonia vaccine after age 67 in [place] with PCP.  Some reflux symptoms and feeling full.    11/15/2019 PFTs-FVC 1.35 (59%), FEV1 0.82 (48%), ratio 61, TLC  83%, DLCO 6.84 (41%) Interpretation-severe obstructive lung disease, with positive bronchodilator response.  Moderate diffusion defect  Allergies  Allergen Reactions  . Codeine Nausea And Vomiting  . Gluten Meal Diarrhea    Abdominal pain and bloating  . Lactose Diarrhea    Bloating and abdominal pain  . Neosporin [Neomycin-Bacitracin Zn-Polymyx]   . Other     Pain medication pt. Cannot remember the name.  . Tramadol     Immunization History  Administered Date(s) Administered  . Fluad Quad(high Dose 65+) 03/05/2019  . PFIZER SARS-COV-2 Vaccination 06/12/2019, 07/03/2019    Past Medical History:  Diagnosis Date  . Allergy   . Anemia   . Anxiety   . Arthritis   . Cataract    pt. not sure which eye early stage  . Depression   . GERD (gastroesophageal reflux disease)   . Hypertension     Tobacco History: Social History   Tobacco Use  Smoking Status Never Smoker  Smokeless Tobacco Never Used   Counseling given: Not Answered   Outpatient Medications Prior to Visit  Medication Sig Dispense Refill  . acetaminophen (TYLENOL) 500 MG tablet Take 500 mg by mouth every 6 (six) hours as needed for mild pain, fever or headache.     Marland Kitchen amiodarone (PACERONE) 200 MG tablet Take 1 tablet (200 mg total) by mouth 2 (two) times daily. 60 tablet 0  . celecoxib (CELEBREX) 200 MG capsule Take 1 capsule (200 mg total) by mouth daily.  3  . furosemide (LASIX)  20 MG tablet TAKE 1 TABLET BY MOUTH EVERY DAY 30 tablet 0  . Hydrocortisone (GERHARDT'S BUTT CREAM) CREA Apply 1 application topically 2 (two) times daily.    Marland Kitchen levalbuterol (XOPENEX) 0.63 MG/3ML nebulizer solution Take 3 mLs (0.63 mg total) by nebulization every 6 (six) hours as needed for wheezing or shortness of breath. 3 mL 12  . montelukast (SINGULAIR) 10 MG tablet Take 1 tablet (10 mg total) by mouth daily.    . Multiple Vitamin (MULTIVITAMIN WITH MINERALS) TABS tablet Take 1 tablet by mouth daily.    . SYMBICORT 80-4.5 MCG/ACT  inhaler Inhale 2 puffs into the lungs in the morning and at bedtime.     No facility-administered medications prior to visit.    Review of Systems  Review of Systems  Respiratory: Negative for cough and wheezing.   Gastrointestinal:       Reflux    Physical Exam  BP 118/78   Pulse 61   Temp 98.3 F (36.8 C) (Oral)   Ht 5\' 2"  (1.575 m)   Wt 115 lb 12.8 oz (52.5 kg)   SpO2 97%   BMI 21.18 kg/m  Physical Exam Constitutional:      Appearance: Normal appearance.  HENT:     Mouth/Throat:     Mouth: Mucous membranes are moist.     Pharynx: Oropharynx is clear.  Cardiovascular:     Rate and Rhythm: Normal rate and regular rhythm.     Comments: Trace BLE edema, non-pitting Pulmonary:     Effort: Pulmonary effort is normal.     Breath sounds: Normal breath sounds.     Comments: CTA Neurological:     General: No focal deficit present.     Mental Status: She is alert and oriented to person, place, and time. Mental status is at baseline.  Psychiatric:        Mood and Affect: Mood normal.        Behavior: Behavior normal.        Thought Content: Thought content normal.        Judgment: Judgment normal.      Lab Results:  CBC    Component Value Date/Time   WBC 6.7 07/28/2019 0905   RBC 2.98 (L) 07/28/2019 0905   HGB 9.1 (L) 07/28/2019 0905   HCT 29.4 (L) 07/28/2019 0905   PLT 270 07/28/2019 0905   MCV 98.7 07/28/2019 0905   MCH 30.5 07/28/2019 0905   MCHC 31.0 07/28/2019 0905   RDW 21.7 (H) 07/28/2019 0905   LYMPHSABS 0.6 (L) 07/28/2019 0905   MONOABS 0.8 07/28/2019 0905   EOSABS 0.3 07/28/2019 0905   BASOSABS 0.0 07/28/2019 0905    BMET    Component Value Date/Time   NA 142 08/02/2019 0600   K 4.1 08/02/2019 0600   CL 103 08/02/2019 0600   CO2 33 (H) 08/02/2019 0600   GLUCOSE 85 08/02/2019 0600   BUN 15 08/02/2019 0600   CREATININE 0.53 08/02/2019 0600   CALCIUM 8.4 (L) 08/02/2019 0600   GFRNONAA >60 08/02/2019 0600   GFRAA >60 08/02/2019 0600     BNP    Component Value Date/Time   BNP 721.3 (H) 07/21/2019 1400    ProBNP No results found for: PROBNP  Imaging: No results found.   Assessment & Plan:   COPD with hypoxia ([place]) - Stable interval, experiences intermittent DOE  - Patient is lifelong non-smoker  - PFTs showed moderate obstruction/restriction in lung function with positive bronchodilator response indicative of obstructive asthma) -  Recommend continuing Symbicort 80 two puffs twice daily; trial singular 10 mg at bedtime.  - We will check an alpha 1 antitrypsin level  - Patient re-qualified for oxygen during last office visit - Up to date with pneumonia, flu and covid-19 vaccines - FU in 3 months with Dr. Carlis Jarvis or APP  GERD (gastroesophageal reflux disease) - Patient reports reflux symptoms and feeling full - Starting omeprazole 20 milligrams daily 30 to 60 minutes before first meal of the day on an empty stomach   Martyn Ehrich, NP 11/22/2019

## 2019-11-16 ENCOUNTER — Telehealth: Payer: Self-pay | Admitting: Primary Care

## 2019-11-16 NOTE — Telephone Encounter (Signed)
Patient called, 3 month recall placed to see Connie Barrow NP, patient forgot to make appointment yesterday. Also medications added to list as requested.

## 2019-11-22 ENCOUNTER — Encounter: Payer: Self-pay | Admitting: Primary Care

## 2019-11-22 NOTE — Assessment & Plan Note (Addendum)
-   Stable interval, experiences intermittent DOE  - Patient is lifelong non-smoker  - PFTs showed moderate obstruction/restriction in lung function with positive bronchodilator response indicative of obstructive asthma) - Recommend continuing Symbicort 80 two puffs twice daily; trial singular 10 mg at bedtime.  - We will check an alpha 1 antitrypsin level  - Patient re-qualified for oxygen during last office visit - Up to date with pneumonia, flu and covid-19 vaccines - FU in 3 months with Dr. Carlis Abbott or APP

## 2019-11-22 NOTE — Assessment & Plan Note (Signed)
-   Patient reports reflux symptoms and feeling full - Starting omeprazole 20 milligrams daily 30 to 60 minutes before first meal of the day on an empty stomach

## 2019-11-24 LAB — ALPHA-1 ANTITRYPSIN PHENOTYPE: A-1 Antitrypsin, Ser: 203 mg/dL — ABNORMAL HIGH (ref 83–199)

## 2019-11-24 NOTE — Progress Notes (Signed)
Please let patient know alpha-1 showed normal pheno type, ever so slightly elevated. I dont think this is clinically significant

## 2019-11-25 NOTE — Progress Notes (Signed)
LMTCB

## 2019-12-01 ENCOUNTER — Other Ambulatory Visit: Payer: Self-pay | Admitting: Radiology

## 2019-12-01 DIAGNOSIS — R921 Mammographic calcification found on diagnostic imaging of breast: Secondary | ICD-10-CM | POA: Diagnosis not present

## 2020-01-07 ENCOUNTER — Other Ambulatory Visit: Payer: Self-pay | Admitting: Cardiothoracic Surgery

## 2020-01-07 DIAGNOSIS — Z9889 Other specified postprocedural states: Secondary | ICD-10-CM

## 2020-01-10 ENCOUNTER — Ambulatory Visit: Payer: Medicare Other | Admitting: Cardiothoracic Surgery

## 2020-01-10 ENCOUNTER — Other Ambulatory Visit: Payer: Self-pay

## 2020-01-10 ENCOUNTER — Ambulatory Visit
Admission: RE | Admit: 2020-01-10 | Discharge: 2020-01-10 | Disposition: A | Discharge status: Home / Self Care | Payer: Medicare Other | Source: Ambulatory Visit | Attending: Cardiothoracic Surgery | Admitting: Cardiothoracic Surgery

## 2020-01-10 ENCOUNTER — Other Ambulatory Visit: Payer: Self-pay | Admitting: Cardiothoracic Surgery

## 2020-01-10 ENCOUNTER — Ambulatory Visit (INDEPENDENT_AMBULATORY_CARE_PROVIDER_SITE_OTHER): Payer: Medicare Other | Admitting: Cardiothoracic Surgery

## 2020-01-10 VITALS — BP 160/80 | HR 68 | Temp 97.6°F | Resp 20 | Ht 60.0 in | Wt 111.0 lb

## 2020-01-10 DIAGNOSIS — J984 Other disorders of lung: Secondary | ICD-10-CM | POA: Diagnosis not present

## 2020-01-10 DIAGNOSIS — J869 Pyothorax without fistula: Secondary | ICD-10-CM

## 2020-01-10 DIAGNOSIS — R0782 Intercostal pain: Secondary | ICD-10-CM

## 2020-01-10 DIAGNOSIS — J929 Pleural plaque without asbestos: Secondary | ICD-10-CM | POA: Diagnosis not present

## 2020-01-10 DIAGNOSIS — Z9889 Other specified postprocedural states: Secondary | ICD-10-CM

## 2020-01-10 DIAGNOSIS — K469 Unspecified abdominal hernia without obstruction or gangrene: Secondary | ICD-10-CM

## 2020-01-10 DIAGNOSIS — I7090 Unspecified atherosclerosis: Secondary | ICD-10-CM | POA: Diagnosis not present

## 2020-01-11 NOTE — Progress Notes (Signed)
[address]       [place] [address]             [phone]     CARDIOTHORACIC SURGERY OFFICE NOTE  Referring Provider is Ronita Hipps, MD Primary Cardiologist is No primary care provider on file. PCP is Ronita Hipps, MD   HPI:  79 yo lady presented this past Spring with left empyema necessitans which was treated with relatively radical left chest wall debridement and omental flap repair of intrathoracic space. She did well after surgery but now returns with a reducible defect in the area of the thoracotomy incision. Denies N/V or F/C. Does note occasional heartburn sx. Having normal bowel habits.    Current Outpatient Medications  Medication Sig Dispense Refill  . acetaminophen (TYLENOL) 500 MG tablet Take 500 mg by mouth every 6 (six) hours as needed for mild pain, fever or headache.     Marland Kitchen amiodarone (PACERONE) 200 MG tablet Take 1 tablet (200 mg total) by mouth 2 (two) times daily. 60 tablet 0  . celecoxib (CELEBREX) 200 MG capsule Take 1 capsule (200 mg total) by mouth daily.  3  . ferrous [id number] C-folic acid (TRINSICON / FOLTRIN) capsule Take 1 capsule by mouth in the morning and at bedtime.    . furosemide (LASIX) 20 MG tablet TAKE 1 TABLET BY MOUTH EVERY DAY 30 tablet 0  . Hydrocortisone (GERHARDT'S BUTT CREAM) CREA Apply 1 application topically 2 (two) times daily.    . montelukast (SINGULAIR) 10 MG tablet Take 1 tablet (10 mg total) by mouth daily.    . Multiple Vitamin (MULTIVITAMIN WITH MINERALS) TABS tablet Take 1 tablet by mouth daily.    Marland Kitchen omeprazole (PRILOSEC) 20 MG capsule Take 1 capsule (20 mg total) by mouth daily. 30 capsule 11  . SYMBICORT 80-4.5 MCG/ACT inhaler Inhale 2 puffs into the lungs in the morning and at bedtime.     No current facility-administered medications for this visit.      Physical Exam:   BP (!) 160/80   Pulse 68   Temp 97.6 F (36.4 C) (Skin)   Resp 20   Ht 5' (1.524 m)   Wt 50.3 kg   SpO2  93% Comment: 2L per [place] O2  BMI 21.68 kg/m   General:  Well-appearing, NAD  Chest:   cta  CV:   rrr  Incisions:  Well-healed upper abdominal and left thoracotomy incisions; there is a reducible mass within the medial aspect of the thoracotomy incision consistent with hernia sac contents  Abdomen:  sntnd  Extremities:  Mild edema  Diagnostic Tests:  CXR with clear lung fields; no obvious enteral contents within the left chest   Impression:  Pleasant 79 yo lady s/p omental flap repair of left chest empyema now with physical exam evidence for hernia in the area of the left thoracotomy incision. 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  Level 2                 10 minutes Level 3                 15 minutes Level 4  25 minutes Level 5                 40 minutes  B. Murvin Natal, MD 01/11/2020 7:22 AM

## 2020-01-19 DIAGNOSIS — H353121 Nonexudative age-related macular degeneration, left eye, early dry stage: Secondary | ICD-10-CM | POA: Diagnosis not present

## 2020-01-19 DIAGNOSIS — H52223 Regular astigmatism, bilateral: Secondary | ICD-10-CM | POA: Diagnosis not present

## 2020-01-19 DIAGNOSIS — H5203 Hypermetropia, bilateral: Secondary | ICD-10-CM | POA: Diagnosis not present

## 2020-01-19 DIAGNOSIS — H524 Presbyopia: Secondary | ICD-10-CM | POA: Diagnosis not present

## 2020-01-19 DIAGNOSIS — H3530 Unspecified macular degeneration: Secondary | ICD-10-CM | POA: Diagnosis not present

## 2020-01-19 DIAGNOSIS — H25813 Combined forms of age-related cataract, bilateral: Secondary | ICD-10-CM | POA: Diagnosis not present

## 2020-01-24 ENCOUNTER — Ambulatory Visit
Admission: RE | Admit: 2020-01-24 | Discharge: 2020-01-24 | Disposition: A | Discharge status: Home / Self Care | Payer: Medicare Other | Source: Ambulatory Visit | Attending: Cardiothoracic Surgery | Admitting: Cardiothoracic Surgery

## 2020-01-24 ENCOUNTER — Encounter: Payer: Self-pay | Admitting: Cardiothoracic Surgery

## 2020-01-24 ENCOUNTER — Other Ambulatory Visit: Payer: Self-pay

## 2020-01-24 ENCOUNTER — Ambulatory Visit (INDEPENDENT_AMBULATORY_CARE_PROVIDER_SITE_OTHER): Payer: Medicare Other | Admitting: Thoracic Surgery (Cardiothoracic Vascular Surgery)

## 2020-01-24 VITALS — BP 161/66 | HR 66 | Temp 97.9°F | Ht 60.0 in | Wt 112.2 lb

## 2020-01-24 DIAGNOSIS — K449 Diaphragmatic hernia without obstruction or gangrene: Secondary | ICD-10-CM

## 2020-01-24 DIAGNOSIS — K469 Unspecified abdominal hernia without obstruction or gangrene: Secondary | ICD-10-CM

## 2020-01-24 DIAGNOSIS — R079 Chest pain, unspecified: Secondary | ICD-10-CM | POA: Diagnosis not present

## 2020-01-24 DIAGNOSIS — K8020 Calculus of gallbladder without cholecystitis without obstruction: Secondary | ICD-10-CM | POA: Diagnosis not present

## 2020-01-24 DIAGNOSIS — R0782 Intercostal pain: Secondary | ICD-10-CM

## 2020-01-24 MED ORDER — IOPAMIDOL (ISOVUE-300) INJECTION 61%
100.0000 mL | Freq: Once | INTRAVENOUS | Status: AC | PRN
  Administered 2020-01-24: 80 mL via INTRAVENOUS

## 2020-01-24 NOTE — Progress Notes (Signed)
     [address]       [place] [address]             [phone]      I had a brief discussion with Ms. Meyer in regards to her chest wall defect.  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 Orvan Seen in February of this year.  She comes in today to speak with him in regards to a bulge and gurgling that she noted along her chest wall.  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  Per the discussion with Dr.Atkins the omental patch was passed through the diaphragm and positioned to the to allow adequate healing to the chest wall defect.  In regards to her symptoms she states that she does have some early satiety which is new since March but her weight has been stable.  She is compensating by having several small meals a day.  Her reflux is unchanged and she denies any pain with eating.   I personally reviewed the CT scan and discussed with her and her husband, and I explained that this would be a very difficult defect to fix.  It would require a left thoracotomy as well as an abdominal incision.  Also explained that it would be difficult to place a permanent mesh to cover the chest wall defect given that it appears as though one rib was removed.  Since this was an infected space I would be hesitant to place a permanent mesh.  The diaphragmatic defect would also be difficult to fix without the risk of strangulating the omental patch.  I do think that if she develops some pain with the eating worsening early satiety we could consider mobilizing the stomach out of the chest back into the abdominal cavity and closing the diaphragmatic defect.  She is quite frail, with multiple medical problems thus pursuing surgery at this point may present more risk than benefit.  She and her husband were understanding of this and will contact this  if her symptoms worsen.

## 2020-02-21 ENCOUNTER — Ambulatory Visit: Payer: Medicare Other | Admitting: Primary Care

## 2020-02-22 ENCOUNTER — Other Ambulatory Visit: Payer: Self-pay

## 2020-02-22 ENCOUNTER — Encounter: Payer: Self-pay | Admitting: Primary Care

## 2020-02-22 ENCOUNTER — Ambulatory Visit (INDEPENDENT_AMBULATORY_CARE_PROVIDER_SITE_OTHER): Payer: Medicare Other | Admitting: Primary Care

## 2020-02-22 VITALS — BP 128/76 | HR 66 | Temp 97.2°F | Ht 60.0 in | Wt 109.6 lb

## 2020-02-22 DIAGNOSIS — J9611 Chronic respiratory failure with hypoxia: Secondary | ICD-10-CM

## 2020-02-22 DIAGNOSIS — J449 Chronic obstructive pulmonary disease, unspecified: Secondary | ICD-10-CM | POA: Diagnosis not present

## 2020-02-22 DIAGNOSIS — R0902 Hypoxemia: Secondary | ICD-10-CM | POA: Diagnosis not present

## 2020-02-22 DIAGNOSIS — K219 Gastro-esophageal reflux disease without esophagitis: Secondary | ICD-10-CM | POA: Diagnosis not present

## 2020-02-22 MED ORDER — SYMBICORT 80-4.5 MCG/ACT IN AERO: 2.0000 | INHALATION_SPRAY | Freq: Two times a day (BID) | RESPIRATORY_TRACT | 11 refills | Status: AC

## 2020-02-22 MED ORDER — ALBUTEROL SULFATE HFA 108 (90 BASE) MCG/ACT IN AERS
1.0000 | INHALATION_SPRAY | Freq: Four times a day (QID) | RESPIRATORY_TRACT | 2 refills | Status: AC | PRN
Start: 2020-02-22 — End: ?

## 2020-02-22 NOTE — Assessment & Plan Note (Addendum)
-   Stable interval; Never smoked. Pulmonary function testing showed moderate obstruction with positive bronchodilator response. Alpha-1 level wnl.  Continue Symbicort 80 two puffs twice daily.  Sending in prescription for Albuterol hfa q4-6 hours as needed only for breakthrough shortness of breath/wheezing. Continue Singulair 10mg  at bedtime. If respiratory status declined consider adding spiriva 1.61mcg. FU 6 months with new [facility] pulmonary provider (former Dr. Carlis Abbott).

## 2020-02-22 NOTE — Patient Instructions (Addendum)
Recommendations: - Continue Symbicort 8mcg two puffs twice daily (rinse mouth after use) - Sending in prescription for Albuterol (Ventolin) rescue inhaler- take 2 puffs every 4-6 hours as needed only for breakthrough shortness of breath/wheezing - Resume omeprazole 20mg  daily in the morning (take 30-60 min prior to breakfast) - Continue Singulair 10mg  at bedtime  - Continue to wear 2L oxygen on exertion  - If breathing worsens or you notice increase in shortness of breath/wheezing or cough notify office  Orders: - Renew oxygen with [employer]; needs 2L pulsed oxygen on exertion   Follow-up: - 6 months with Dr. Tharon Aquas (new patient- formerly with Dr. Carlis Abbott)    Asthma, Adult  Asthma is a long-term (chronic) condition in which the airways get tight and narrow. The airways are the breathing passages that lead from the nose and mouth down into the lungs. A person with asthma will have times when symptoms get worse. These are called asthma attacks. They can cause coughing, whistling sounds when you breathe (wheezing), shortness of breath, and chest pain. They can make it hard to breathe. There is no cure for asthma, but medicines and lifestyle changes can help control it. There are many things that can bring on an asthma attack or make asthma symptoms worse (triggers). Common triggers include:  Mold.  Dust.  Cigarette smoke.  Cockroaches.  Things that can cause allergy symptoms (allergens). These include animal skin flakes (dander) and pollen from trees or grass.  Things that pollute the air. These may include household cleaners, wood smoke, smog, or chemical odors.  Cold air, weather changes, and wind.  Crying or laughing hard.  Stress.  Certain medicines or drugs.  Certain foods such as dried fruit, potato chips, and grape juice.  Infections, such as a cold or the flu.  Certain medical conditions or diseases.  Exercise or tiring activities. Asthma may be treated with  medicines and by staying away from the things that cause asthma attacks. Types of medicines may include:  Controller medicines. These help prevent asthma symptoms. They are usually taken every day.  Fast-acting reliever or rescue medicines. These quickly relieve asthma symptoms. They are used as needed and provide short-term relief.  Allergy medicines if your attacks are brought on by allergens.  Medicines to help control the body's defense (immune) system. Follow these instructions at home: Avoiding triggers in your home  Change your heating and air conditioning filter often.  Limit your use of fireplaces and wood stoves.  Get rid of pests (such as roaches and mice) and their droppings.  Throw away plants if you see mold on them.  Clean your floors. Dust regularly. Use cleaning products that do not smell.  Have someone vacuum when you are not home. Use a vacuum cleaner with a HEPA filter if possible.  Replace carpet with wood, tile, or vinyl flooring. Carpet can trap animal skin flakes and dust.  Use allergy-proof pillows, mattress covers, and box spring covers.  Wash bed sheets and blankets every week in hot water. Dry them in a dryer.  Keep your bedroom free of any triggers.  Avoid pets and keep windows closed when things that cause allergy symptoms are in the air.  Use blankets that are made of polyester or cotton.  Clean bathrooms and kitchens with bleach. If possible, have someone repaint the walls in these rooms with mold-resistant paint. Keep out of the rooms that are being cleaned and painted.  Wash your hands often with soap and water. If soap and  water are not available, use hand sanitizer.  Do not allow anyone to smoke in your home. General instructions  Take over-the-counter and prescription medicines only as told by your doctor. ? Talk with your doctor if you have questions about how or when to take your medicines. ? Make note if you need to use your  medicines more often than usual.  Do not use any products that contain nicotine or tobacco, such as cigarettes and e-cigarettes. If you need help quitting, ask your doctor.  Stay away from secondhand smoke.  Avoid doing things outdoors when allergen counts are high and when air quality is low.  Wear a ski mask when doing outdoor activities in the winter. The mask should cover your nose and mouth. Exercise indoors on cold days if you can.  Warm up before you exercise. Take time to cool down after exercise.  Use a peak flow meter as told by your doctor. A peak flow meter is a tool that measures how well the lungs are working.  Keep track of the peak flow meter's readings. Write them down.  Follow your asthma action plan. This is a written plan for taking care of your asthma and treating your attacks.  Make sure you get all the shots (vaccines) that your doctor recommends. Ask your doctor about a flu shot and a pneumonia shot.  Keep all follow-up visits as told by your doctor. This is important. Contact a doctor if:  You have wheezing, shortness of breath, or a cough even while taking medicine to prevent attacks.  The mucus you cough up (sputum) is thicker than usual.  The mucus you cough up changes from clear or white to yellow, green, gray, or bloody.  You have problems from the medicine you are taking, such as: ? A rash. ? Itching. ? Swelling. ? Trouble breathing.  You need reliever medicines more than 2-3 times a week.  Your peak flow reading is still at 50-79% of your personal best after following the action plan for 1 hour.  You have a fever. Get help right away if:  You seem to be worse and are not responding to medicine during an asthma attack.  You are short of breath even at rest.  You get short of breath when doing very little activity.  You have trouble eating, drinking, or talking.  You have chest pain or tightness.  You have a fast heartbeat.  Your  lips or fingernails start to turn blue.  You are light-headed or dizzy, or you faint.  Your peak flow is less than 50% of your personal best.  You feel too tired to breathe normally. Summary  Asthma is a long-term (chronic) condition in which the airways get tight and narrow. An asthma attack can make it hard to breathe.  Asthma cannot be cured, but medicines and lifestyle changes can help control it.  Make sure you understand how to avoid triggers and how and when to use your medicines. This information is not intended to replace advice given to you by your health care provider. Make sure you discuss any questions you have with your health care provider. Document Revised: 07/16/2018 Document Reviewed: 06/17/2016 Elsevier Patient Education  2020 [employer].

## 2020-02-22 NOTE — Progress Notes (Signed)
@Patient  ID: Connie Jarvis, female    DOB: [birth year], 79 y.o.   MRN: [id number]  Chief Complaint  Patient presents with  . Follow-up    Pt states she has been doing well since last visit and denies any complaints.    Referring provider: Ronita Hipps, MD  HPI: 79 year old female, never smoked.  Past medical history significant for COPD/asthma , chronic respiratory failure with hypoxia, GERD, atrial fibrillation, hypertension, pulmonary hypertension.  Patient of Dr. Carlis Abbott, seen on 09/03/2019.  She underwent a thoracotomy in February and subsequently developed a wound infection requiring repair.  He has had asthma her whole life and was on Symbicort prior to admission.  He was diagnosed with pulmonary hypertension 1999 and was on sildenafil prior to hospitalization.  She underwent a right heart cath with PVR less than 1 WU thoracotomy.  She was discharged without any pulmonary vasodilators.  No family history of lung disease.  Previous [facility] pulmonary encounter: 11/15/2019 Presents today to review recent pulmonary function testing. She continues taking Symbicort 80 two puffs twice a day. Breathing is alright with oxygen. She occasionally has shortness of breath with activity. She does not have an albuterol rescue inhaler. She has xopenex nebulizer, she has not needed to use this since February. She is taking Singulair 10mg  in the morning. She has been off vasodilators since February. Her breathing is the same off this medication. She has been on 3L oxygen since Labor day 2020.  She is currently on 2L oxygen. She is comfortable on oxygen. She has a pulse oximeter at home but does not regularly monitor her level. She is not needing to use her walker as much as home. She is doing more. Re-qualified for oxygen during last office visit. She is up to date with pneumonia vaccine, flu and covid. She got pneumonia vaccine after age 62 in [place] with PCP.  Some reflux symptoms and feeling full.     02/22/2020 Patient presents today for 3 month follow-up. Patient is doing well, no acute complaints. Needs refill Symbicort 80. She is using 2L oxygen at all times. She has been using riding chair down to the basement. She has lost weight since February when she was hospitalized/thoracotomy. She recently followed up with Dr. Kipp Brood. CT chest in August 2021 showed scarring and volume loss in the lingula and left lower lobe. Lungs otherwise clear. Airway unremarkable.   Pulmonary testing: 11/15/2019 PFTs-FVC 1.35 (59%), FEV1 0.82 (48%), ratio 61, TLC 83%, DLCO 6.84 (41%) Interpretation-Severe obstruction with positive bronchodilator response.  Moderate diffusion defect  Allergies  Allergen Reactions  . Codeine Nausea And Vomiting  . Gluten Meal Diarrhea    Abdominal pain and bloating  . Lactose Diarrhea    Bloating and abdominal pain  . Neosporin [Neomycin-Bacitracin Zn-Polymyx]   . Other     Pain medication pt. Cannot remember the name.  . Tramadol     Immunization History  Administered Date(s) Administered  . Fluad Quad(high Dose 65+) 03/05/2019  . PFIZER SARS-COV-2 Vaccination 06/12/2019, 07/03/2019    Past Medical History:  Diagnosis Date  . Allergy   . Anemia   . Anxiety   . Arthritis   . Cataract    pt. not sure which eye early stage  . Depression   . GERD (gastroesophageal reflux disease)   . Hypertension     Tobacco History: Social History   Tobacco Use  Smoking Status Never Smoker  Smokeless Tobacco Never Used   Counseling given: Not Answered  Outpatient Medications Prior to Visit  Medication Sig Dispense Refill  . acetaminophen (TYLENOL) 500 MG tablet Take 500 mg by mouth every 6 (six) hours as needed for mild pain, fever or headache.     Marland Kitchen amiodarone (PACERONE) 200 MG tablet Take 1 tablet (200 mg total) by mouth 2 (two) times daily. 60 tablet 0  . celecoxib (CELEBREX) 200 MG capsule Take 1 capsule (200 mg total) by mouth daily.  3  . ferrous  [id number] C-folic acid (TRINSICON / FOLTRIN) capsule Take 1 capsule by mouth in the morning and at bedtime.    . furosemide (LASIX) 20 MG tablet TAKE 1 TABLET BY MOUTH EVERY DAY 30 tablet 0  . Hydrocortisone (GERHARDT'S BUTT CREAM) CREA Apply 1 application topically 2 (two) times daily.    . montelukast (SINGULAIR) 10 MG tablet Take 1 tablet (10 mg total) by mouth daily.    . Multiple Vitamin (MULTIVITAMIN WITH MINERALS) TABS tablet Take 1 tablet by mouth daily.    Marland Kitchen omeprazole (PRILOSEC) 20 MG capsule Take 1 capsule (20 mg total) by mouth daily. 30 capsule 11  . SYMBICORT 80-4.5 MCG/ACT inhaler Inhale 2 puffs into the lungs in the morning and at bedtime.     No facility-administered medications prior to visit.    Review of Systems  Review of Systems  Constitutional: Positive for unexpected weight change.  Respiratory: Negative for cough, chest tightness, shortness of breath and wheezing.   Cardiovascular: Negative.     Physical Exam  BP 128/76 (BP Location: Left Arm, Cuff Size: Normal)   Pulse 66   Temp (!) 97.2 F (36.2 C) (Other (Comment)) Comment (Src): wrist  Ht 5' (1.524 m)   Wt 109 lb 9.6 oz (49.7 kg)   SpO2 98%   BMI 21.40 kg/m  Physical Exam Constitutional:      Appearance: Normal appearance.  Cardiovascular:     Rate and Rhythm: Normal rate and regular rhythm.  Pulmonary:     Effort: Pulmonary effort is normal.     Breath sounds: Wheezing present.  Abdominal:     General: There is distension.  Neurological:     General: No focal deficit present.     Mental Status: She is alert and oriented to person, place, and time. Mental status is at baseline.  Psychiatric:        Mood and Affect: Mood normal.        Behavior: Behavior normal.        Thought Content: Thought content normal.        Judgment: Judgment normal.      Lab Results:  CBC    Component Value Date/Time   WBC 6.7 07/28/2019 0905   RBC 2.98 (L) 07/28/2019 0905   HGB 9.1 (L)  07/28/2019 0905   HCT 29.4 (L) 07/28/2019 0905   PLT 270 07/28/2019 0905   MCV 98.7 07/28/2019 0905   MCH 30.5 07/28/2019 0905   MCHC 31.0 07/28/2019 0905   RDW 21.7 (H) 07/28/2019 0905   LYMPHSABS 0.6 (L) 07/28/2019 0905   MONOABS 0.8 07/28/2019 0905   EOSABS 0.3 07/28/2019 0905   BASOSABS 0.0 07/28/2019 0905    BMET    Component Value Date/Time   NA 142 08/02/2019 0600   K 4.1 08/02/2019 0600   CL 103 08/02/2019 0600   CO2 33 (H) 08/02/2019 0600   GLUCOSE 85 08/02/2019 0600   BUN 15 08/02/2019 0600   CREATININE 0.53 08/02/2019 0600   CALCIUM 8.4 (L) 08/02/2019 0600  GFRNONAA >60 08/02/2019 0600   GFRAA >60 08/02/2019 0600    BNP    Component Value Date/Time   BNP 721.3 (H) 07/21/2019 1400    ProBNP No results found for: PROBNP  Imaging: No results found.   Assessment & Plan:   COPD with hypoxia ([place]) - Stable interval; Never smoked. Pulmonary function testing showed moderate obstruction with positive bronchodilator response. Alpha-1 level wnl.  Continue Symbicort 80 two puffs twice daily.  Sending in prescription for Albuterol hfa q4-6 hours as needed only for breakthrough shortness of breath/wheezing. Continue Singulair 10mg  at bedtime. If respiratory status declined consider adding spiriva 1.4mcg. FU 6 months with new [facility] pulmonary provider (former Dr. Carlis Abbott).   Chronic respiratory failure with hypoxia ([facility]) - Ambulatory walk test today showed O2 desaturated to 87% RA, maintained 94-95% O2 on 2L pulsed - Renew oxygen with [employer]; needs 2L pulsed oxygen on exertion   GERD (gastroesophageal reflux disease) - Continue omeprazole 20mg  daily    Martyn Ehrich, NP 02/25/2020

## 2020-02-24 DIAGNOSIS — I493 Ventricular premature depolarization: Secondary | ICD-10-CM | POA: Diagnosis not present

## 2020-02-24 DIAGNOSIS — I272 Pulmonary hypertension, unspecified: Secondary | ICD-10-CM | POA: Diagnosis not present

## 2020-02-24 DIAGNOSIS — I351 Nonrheumatic aortic (valve) insufficiency: Secondary | ICD-10-CM | POA: Diagnosis not present

## 2020-02-24 DIAGNOSIS — I340 Nonrheumatic mitral (valve) insufficiency: Secondary | ICD-10-CM | POA: Diagnosis not present

## 2020-02-24 DIAGNOSIS — R000 Tachycardia, unspecified: Secondary | ICD-10-CM | POA: Diagnosis not present

## 2020-02-24 DIAGNOSIS — I2510 Atherosclerotic heart disease of native coronary artery without angina pectoris: Secondary | ICD-10-CM | POA: Diagnosis not present

## 2020-02-24 DIAGNOSIS — Z7982 Long term (current) use of aspirin: Secondary | ICD-10-CM | POA: Diagnosis not present

## 2020-02-25 DIAGNOSIS — J9611 Chronic respiratory failure with hypoxia: Secondary | ICD-10-CM | POA: Insufficient documentation

## 2020-02-25 NOTE — Assessment & Plan Note (Signed)
-  Continue omeprazole 20 mg daily

## 2020-02-25 NOTE — Assessment & Plan Note (Addendum)
-   Ambulatory walk test today showed O2 desaturated to 87% RA, maintained 94-95% O2 on 2L pulsed - Renew oxygen with [employer]; needs 2L pulsed oxygen on exertion

## 2020-03-03 DIAGNOSIS — Z23 Encounter for immunization: Secondary | ICD-10-CM | POA: Diagnosis not present

## 2020-03-20 DIAGNOSIS — I482 Chronic atrial fibrillation, unspecified: Secondary | ICD-10-CM | POA: Diagnosis present

## 2020-03-20 DIAGNOSIS — N189 Chronic kidney disease, unspecified: Secondary | ICD-10-CM | POA: Diagnosis present

## 2020-03-20 DIAGNOSIS — Z79899 Other long term (current) drug therapy: Secondary | ICD-10-CM | POA: Diagnosis not present

## 2020-03-20 DIAGNOSIS — Z66 Do not resuscitate: Secondary | ICD-10-CM | POA: Diagnosis not present

## 2020-03-20 DIAGNOSIS — M1990 Unspecified osteoarthritis, unspecified site: Secondary | ICD-10-CM | POA: Diagnosis present

## 2020-03-20 DIAGNOSIS — J45909 Unspecified asthma, uncomplicated: Secondary | ICD-10-CM | POA: Diagnosis present

## 2020-03-20 DIAGNOSIS — Z862 Personal history of diseases of the blood and blood-forming organs and certain disorders involving the immune mechanism: Secondary | ICD-10-CM | POA: Diagnosis not present

## 2020-03-20 DIAGNOSIS — I5032 Chronic diastolic (congestive) heart failure: Secondary | ICD-10-CM | POA: Diagnosis present

## 2020-03-20 DIAGNOSIS — R079 Chest pain, unspecified: Secondary | ICD-10-CM | POA: Diagnosis not present

## 2020-03-20 DIAGNOSIS — R5383 Other fatigue: Secondary | ICD-10-CM | POA: Diagnosis not present

## 2020-03-20 DIAGNOSIS — J690 Pneumonitis due to inhalation of food and vomit: Secondary | ICD-10-CM | POA: Diagnosis not present

## 2020-03-20 DIAGNOSIS — J90 Pleural effusion, not elsewhere classified: Secondary | ICD-10-CM | POA: Diagnosis not present

## 2020-03-20 DIAGNOSIS — Z792 Long term (current) use of antibiotics: Secondary | ICD-10-CM | POA: Diagnosis not present

## 2020-03-20 DIAGNOSIS — K7200 Acute and subacute hepatic failure without coma: Secondary | ICD-10-CM | POA: Diagnosis present

## 2020-03-20 DIAGNOSIS — R55 Syncope and collapse: Secondary | ICD-10-CM | POA: Diagnosis not present

## 2020-03-20 DIAGNOSIS — I2510 Atherosclerotic heart disease of native coronary artery without angina pectoris: Secondary | ICD-10-CM | POA: Diagnosis present

## 2020-03-20 DIAGNOSIS — Z452 Encounter for adjustment and management of vascular access device: Secondary | ICD-10-CM | POA: Diagnosis not present

## 2020-03-20 DIAGNOSIS — I130 Hypertensive heart and chronic kidney disease with heart failure and stage 1 through stage 4 chronic kidney disease, or unspecified chronic kidney disease: Secondary | ICD-10-CM | POA: Diagnosis present

## 2020-03-20 DIAGNOSIS — E43 Unspecified severe protein-calorie malnutrition: Secondary | ICD-10-CM | POA: Diagnosis present

## 2020-03-20 DIAGNOSIS — J9620 Acute and chronic respiratory failure, unspecified whether with hypoxia or hypercapnia: Secondary | ICD-10-CM | POA: Diagnosis not present

## 2020-03-20 DIAGNOSIS — Z9289 Personal history of other medical treatment: Secondary | ICD-10-CM | POA: Diagnosis not present

## 2020-03-20 DIAGNOSIS — I214 Non-ST elevation (NSTEMI) myocardial infarction: Secondary | ICD-10-CM | POA: Diagnosis not present

## 2020-03-20 DIAGNOSIS — N170 Acute kidney failure with tubular necrosis: Secondary | ICD-10-CM | POA: Diagnosis present

## 2020-03-20 DIAGNOSIS — J9621 Acute and chronic respiratory failure with hypoxia: Secondary | ICD-10-CM | POA: Diagnosis not present

## 2020-03-20 DIAGNOSIS — E860 Dehydration: Secondary | ICD-10-CM | POA: Diagnosis not present

## 2020-03-20 DIAGNOSIS — R778 Other specified abnormalities of plasma proteins: Secondary | ICD-10-CM | POA: Diagnosis present

## 2020-03-20 DIAGNOSIS — K449 Diaphragmatic hernia without obstruction or gangrene: Secondary | ICD-10-CM | POA: Diagnosis not present

## 2020-03-20 DIAGNOSIS — E876 Hypokalemia: Secondary | ICD-10-CM | POA: Diagnosis not present

## 2020-03-20 DIAGNOSIS — I469 Cardiac arrest, cause unspecified: Secondary | ICD-10-CM | POA: Diagnosis not present

## 2020-03-20 DIAGNOSIS — K8020 Calculus of gallbladder without cholecystitis without obstruction: Secondary | ICD-10-CM | POA: Diagnosis not present

## 2020-03-20 DIAGNOSIS — I272 Pulmonary hypertension, unspecified: Secondary | ICD-10-CM | POA: Diagnosis present

## 2020-03-20 DIAGNOSIS — I480 Paroxysmal atrial fibrillation: Secondary | ICD-10-CM | POA: Diagnosis present

## 2020-03-20 DIAGNOSIS — J189 Pneumonia, unspecified organism: Secondary | ICD-10-CM | POA: Diagnosis not present

## 2020-03-20 DIAGNOSIS — R9431 Abnormal electrocardiogram [ECG] [EKG]: Secondary | ICD-10-CM | POA: Diagnosis not present

## 2020-03-20 DIAGNOSIS — N179 Acute kidney failure, unspecified: Secondary | ICD-10-CM | POA: Diagnosis not present

## 2020-03-20 DIAGNOSIS — K219 Gastro-esophageal reflux disease without esophagitis: Secondary | ICD-10-CM | POA: Diagnosis present

## 2020-03-20 DIAGNOSIS — J9601 Acute respiratory failure with hypoxia: Secondary | ICD-10-CM | POA: Diagnosis not present

## 2020-03-20 DIAGNOSIS — A419 Sepsis, unspecified organism: Secondary | ICD-10-CM | POA: Diagnosis not present

## 2020-03-20 DIAGNOSIS — I248 Other forms of acute ischemic heart disease: Secondary | ICD-10-CM | POA: Diagnosis present

## 2020-03-20 DIAGNOSIS — I2721 Secondary pulmonary arterial hypertension: Secondary | ICD-10-CM | POA: Diagnosis not present

## 2020-03-20 DIAGNOSIS — K828 Other specified diseases of gallbladder: Secondary | ICD-10-CM | POA: Diagnosis not present

## 2020-03-20 DIAGNOSIS — Z20828 Contact with and (suspected) exposure to other viral communicable diseases: Secondary | ICD-10-CM | POA: Diagnosis not present

## 2020-03-20 DIAGNOSIS — R570 Cardiogenic shock: Secondary | ICD-10-CM | POA: Diagnosis not present

## 2020-03-21 DIAGNOSIS — K7200 Acute and subacute hepatic failure without coma: Secondary | ICD-10-CM

## 2020-03-21 DIAGNOSIS — R9431 Abnormal electrocardiogram [ECG] [EKG]: Secondary | ICD-10-CM

## 2020-03-21 DIAGNOSIS — R55 Syncope and collapse: Secondary | ICD-10-CM

## 2020-03-21 DIAGNOSIS — I480 Paroxysmal atrial fibrillation: Secondary | ICD-10-CM

## 2020-03-21 DIAGNOSIS — R079 Chest pain, unspecified: Secondary | ICD-10-CM

## 2020-03-21 DIAGNOSIS — I2510 Atherosclerotic heart disease of native coronary artery without angina pectoris: Secondary | ICD-10-CM

## 2020-03-21 DIAGNOSIS — I214 Non-ST elevation (NSTEMI) myocardial infarction: Secondary | ICD-10-CM

## 2020-03-22 DIAGNOSIS — I2721 Secondary pulmonary arterial hypertension: Secondary | ICD-10-CM

## 2020-03-22 DIAGNOSIS — E860 Dehydration: Secondary | ICD-10-CM

## 2020-03-22 DIAGNOSIS — Z9289 Personal history of other medical treatment: Secondary | ICD-10-CM

## 2020-03-22 DIAGNOSIS — R778 Other specified abnormalities of plasma proteins: Secondary | ICD-10-CM

## 2020-03-22 DIAGNOSIS — R55 Syncope and collapse: Secondary | ICD-10-CM

## 2020-03-22 DIAGNOSIS — R079 Chest pain, unspecified: Secondary | ICD-10-CM | POA: Diagnosis not present

## 2020-03-22 DIAGNOSIS — I2510 Atherosclerotic heart disease of native coronary artery without angina pectoris: Secondary | ICD-10-CM

## 2020-03-27 DEATH — deceased

## 2020-08-03 ENCOUNTER — Telehealth: Payer: Self-pay | Admitting: Primary Care

## 2020-08-03 NOTE — Telephone Encounter (Signed)
Called and spoke with pt's spouse about message received. Eddie Dibbles stated that pt passed away 04-20-2020. Mable Fill my condolences. Nothing further needed.

## 2021-03-20 IMAGING — DX DG CHEST 1V PORT
1 series · 1 of 1 positions shown · non-contrast
Comparison: 07/08/2019

CLINICAL DATA: Status post thoracotomy and evacuation of left
empyema.

EXAM:
PORTABLE CHEST 1 VIEW

[chest ap]
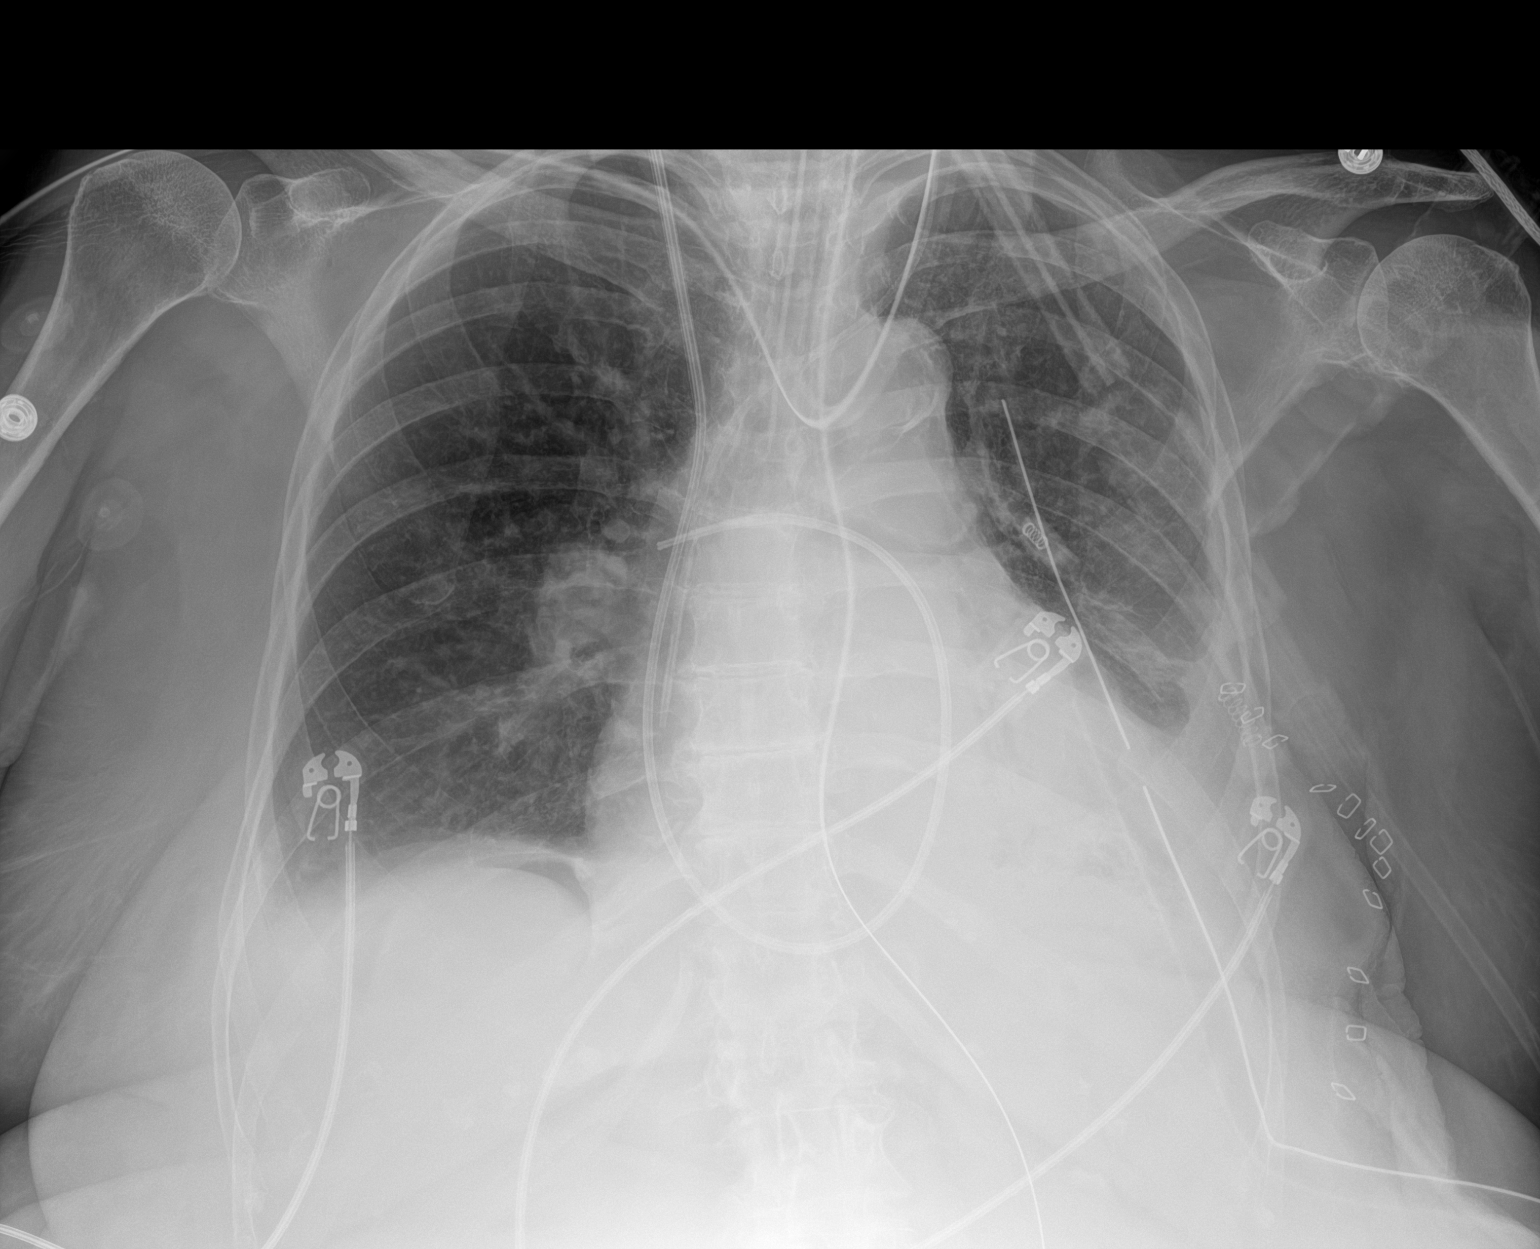

[1 of 1 positions shown; findings below may reference images not displayed]

FINDINGS: Endotracheal tube remains with the tip approximately 1.3 cm above
the carina. Swan-Ganz catheter tip remains in the proximal right
pulmonary artery. Gastric decompression tube extends into the
stomach. Single left chest tube shows stable positioning with no
pneumothorax. Stable left lower lobe consolidation/atelectasis.
Right lung remains clear. No edema. Stable mild cardiac enlargement.
IMPRESSION: Stable left lower lobe consolidation/atelectasis. No pneumothorax.

## 2021-03-21 IMAGING — DX DG CHEST 1V PORT
1 series · 1 of 1 positions shown · non-contrast
Comparison: 07/09/2019

CLINICAL DATA: Evaluate chest tube placement

EXAM:
PORTABLE CHEST 1 VIEW

[chest]
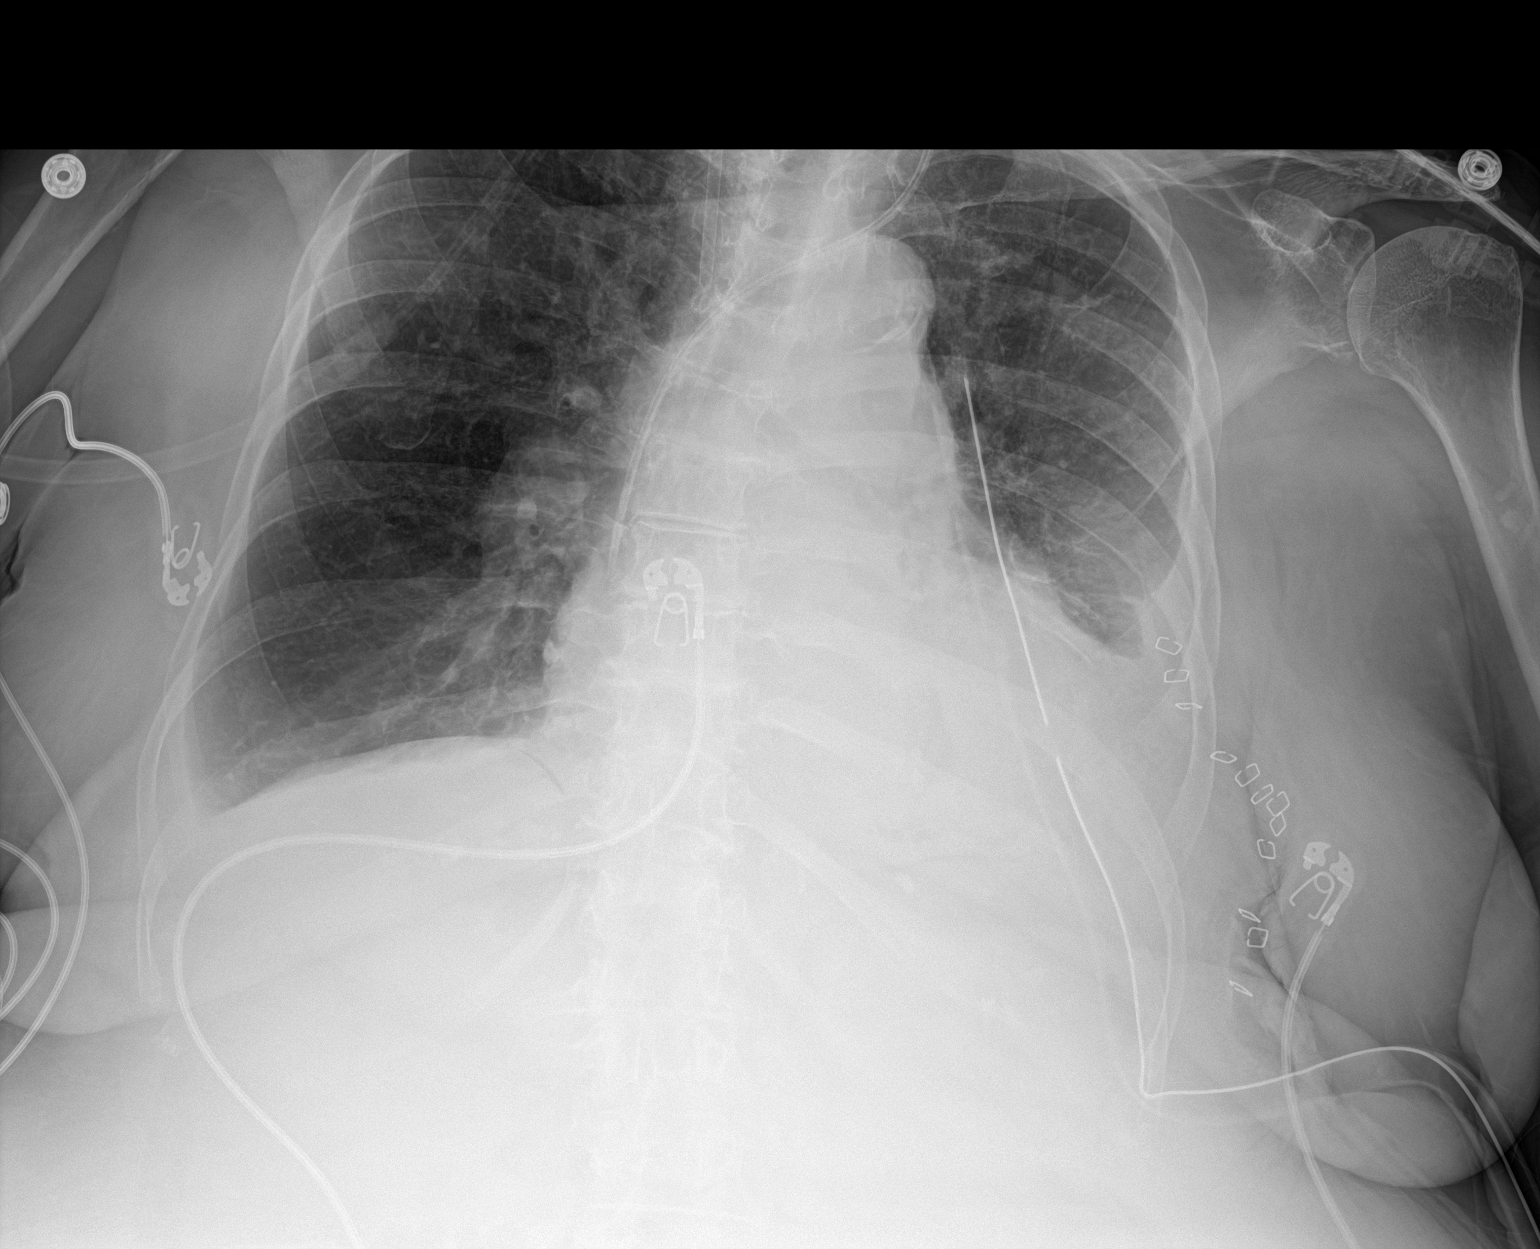

[1 of 1 positions shown; findings below may reference images not displayed]

FINDINGS: There is a left IJ catheter with tip at the cavoatrial junction.
Right IJ catheter sheath projects over the mid SVC. Left chest tube
is in place without pneumothorax. Unchanged moderate left pleural
effusion and small right pleural effusion. There is a thin lucency
along the undersurface of the right hemidiaphragm. In retrospect
this appears to have been present on the prior examination. Cannot
rule out pneumoperitoneum.
IMPRESSION: 1. Stable position of left chest tube. No pneumothorax. No change in
bilateral pleural effusions.
2. Possible pneumoperitoneum. Correlation with recent surgical
history. In the absence of known explanation for pneumoperitoneum,
advise further evaluation with upright or left lateral decubitus
radiographs to confirm Critical Value/emergent results were called
by telephone at the time of interpretation on 07/10/2019 at [DATE]
to provider Nurse Moolman, who verbally acknowledged these results.

## 2021-03-22 IMAGING — DX DG CHEST 1V PORT
1 series · 1 of 1 positions shown · non-contrast
Comparison: 07/10/2019

CLINICAL DATA: Follow-up chest tube

EXAM:
PORTABLE CHEST 1 VIEW

[chest ap]
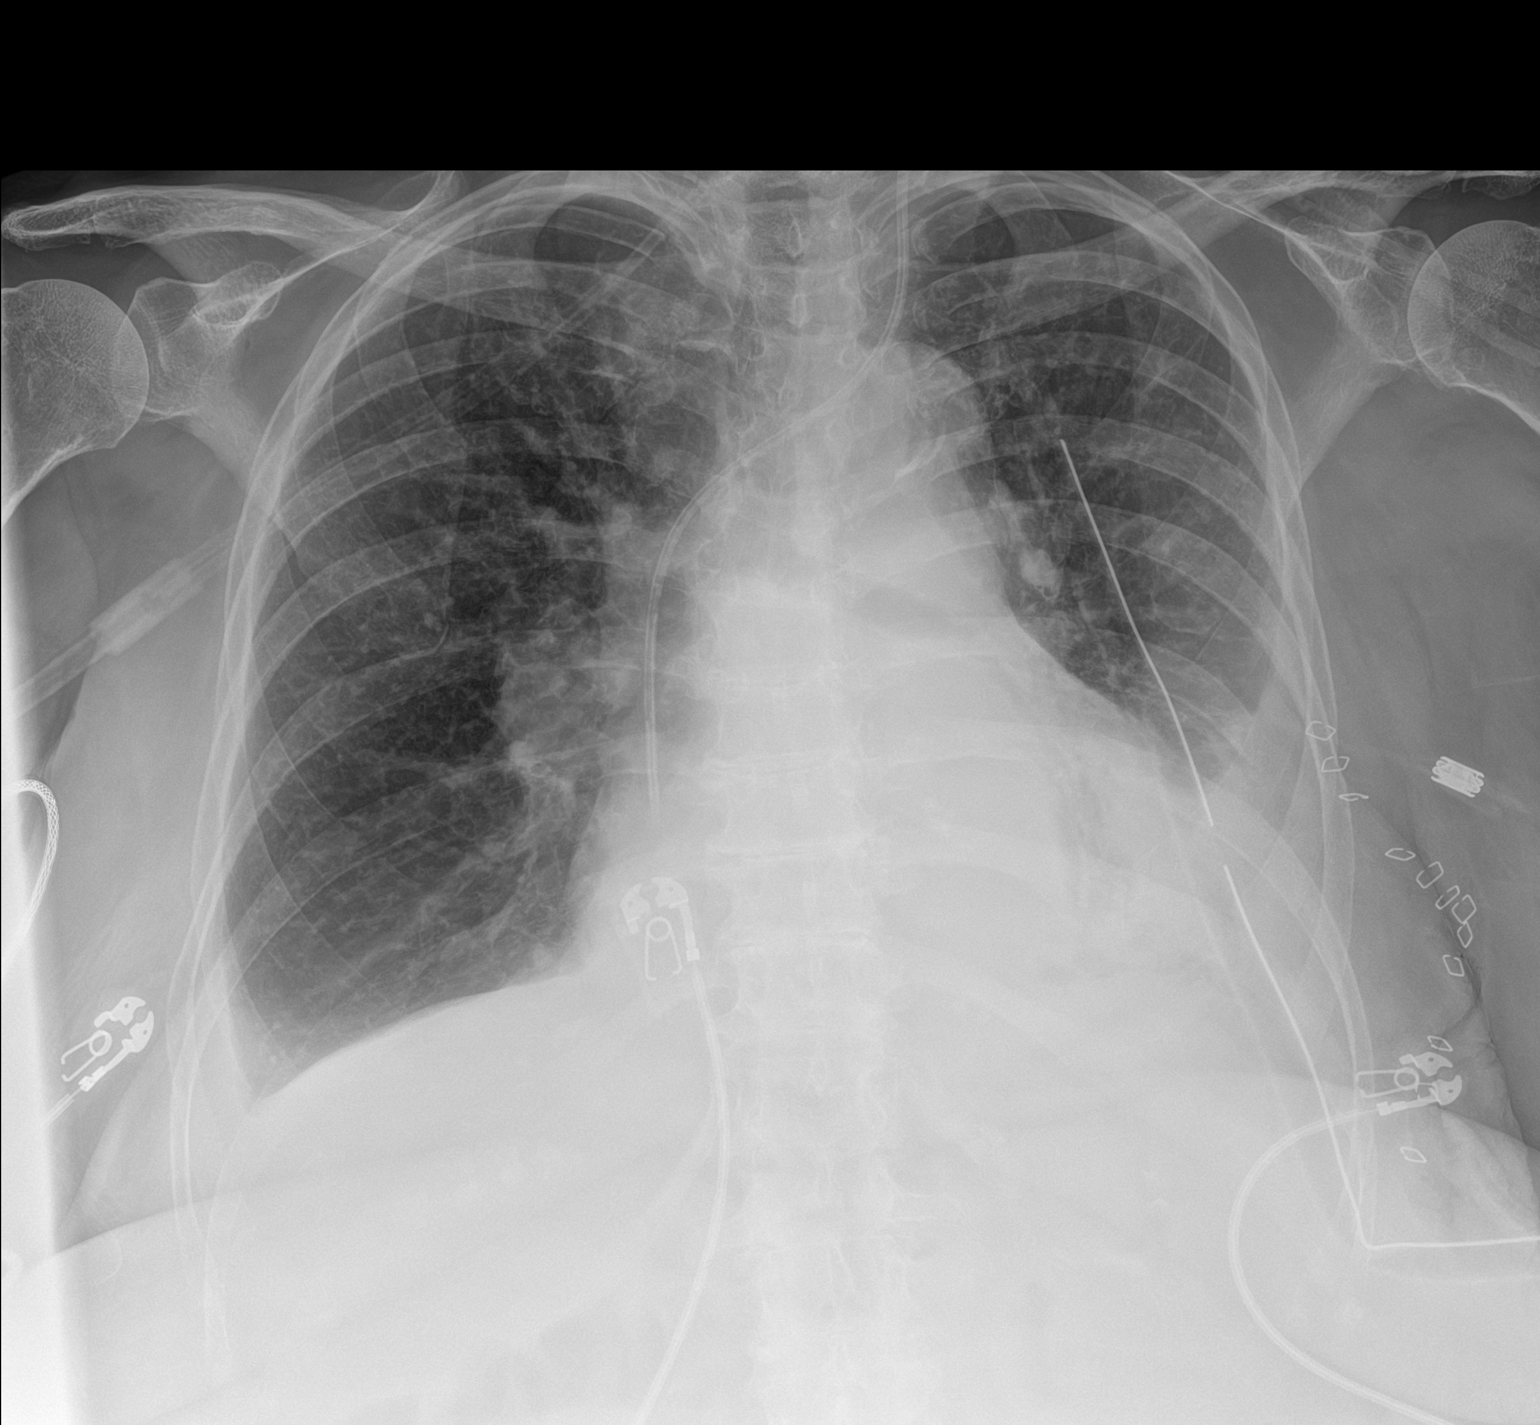

[1 of 1 positions shown; findings below may reference images not displayed]

FINDINGS: Left thoracostomy catheter is again seen and stable. The catheter is
kinked at the chest wall level. No pneumothorax is seen. Persistent
left pleural effusion and left basilar consolidation is seen. Small
right pleural effusion is noted. Left jugular central line is noted
with the tip at the cavoatrial junction.
IMPRESSION: Stable appearance of the chest when compared with the prior exam.

Previously seen lucency under the right hemidiaphragm is not
appreciated on today's exam.

## 2021-03-23 IMAGING — DX DG CHEST 1V PORT
1 series · 1 of 1 positions shown · non-contrast
Comparison: Chest radiograph 07/11/2019

CLINICAL DATA: Pneumothorax on left. Additional history provided:
Chest tube present, pneumothorax, lung surgery

EXAM:
PORTABLE CHEST 1 VIEW

[chest ap]
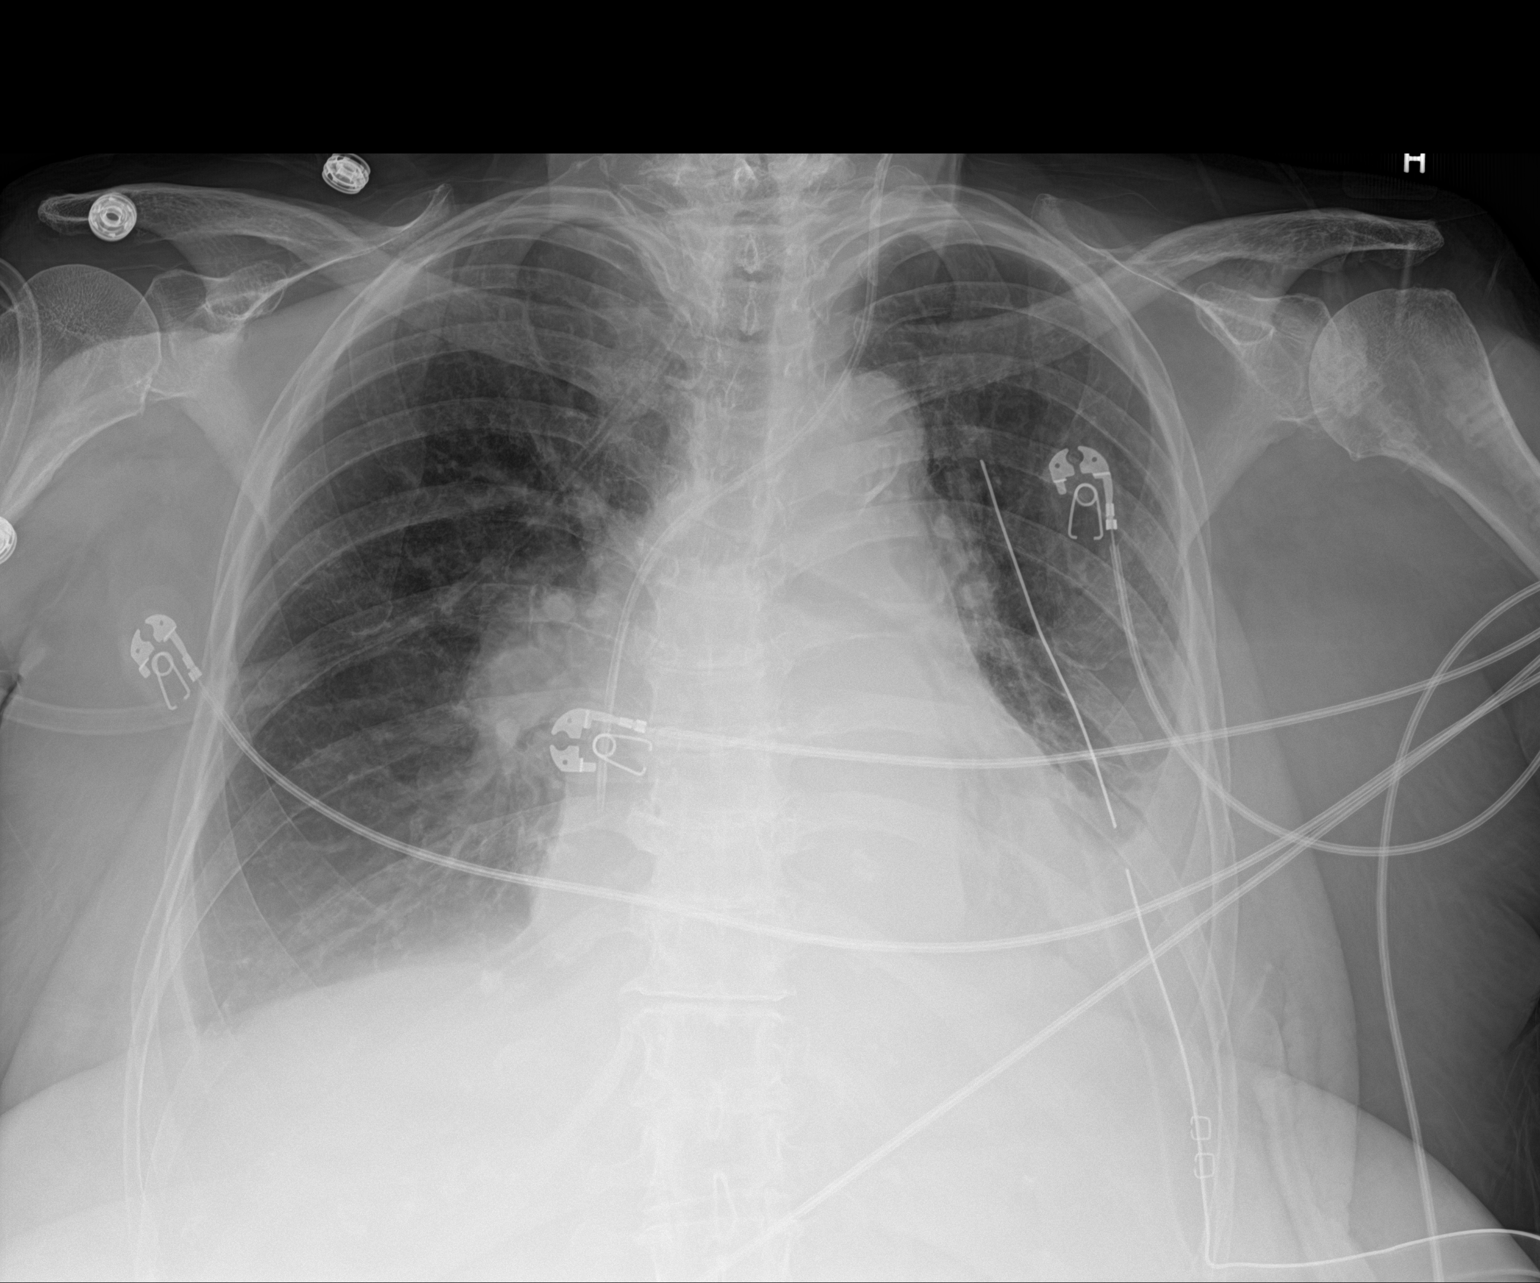

[1 of 1 positions shown; findings below may reference images not displayed]

FINDINGS: Unchanged position of a left IJ approach central venous catheter
with tip projecting in the region of the upper right atrium.
Unchanged position of a left-sided chest tube. As before, the chest
tube is kinked at the level of the thoracic wall. Overlying cardiac
monitoring leads.

The cardiomediastinal silhouette is unchanged. Aortic
atherosclerosis. No visible pneumothorax. Persistent left pleural
effusion with associated left basilar atelectasis and/or
consolidation. Persistent trace right pleural effusion.
IMPRESSION: No significant interval change as compared to chest radiograph
07/11/2019, as detailed.

As before, the left-sided chest tube appears slightly kinked at the
level of the chest wall. No visible pneumothorax.

Redemonstrated left pleural effusion with left basilar atelectasis
and/or consolidation.

Persistent small right pleural effusion.

## 2021-03-24 IMAGING — DX DG CHEST 1V PORT
1 series · 1 of 1 positions shown · non-contrast
Comparison: Chest CT 07/07/2019 and chest x-ray 07/12/2019

CLINICAL DATA: Postop empyema surgery.

EXAM:
PORTABLE CHEST 1 VIEW

[chest ap]
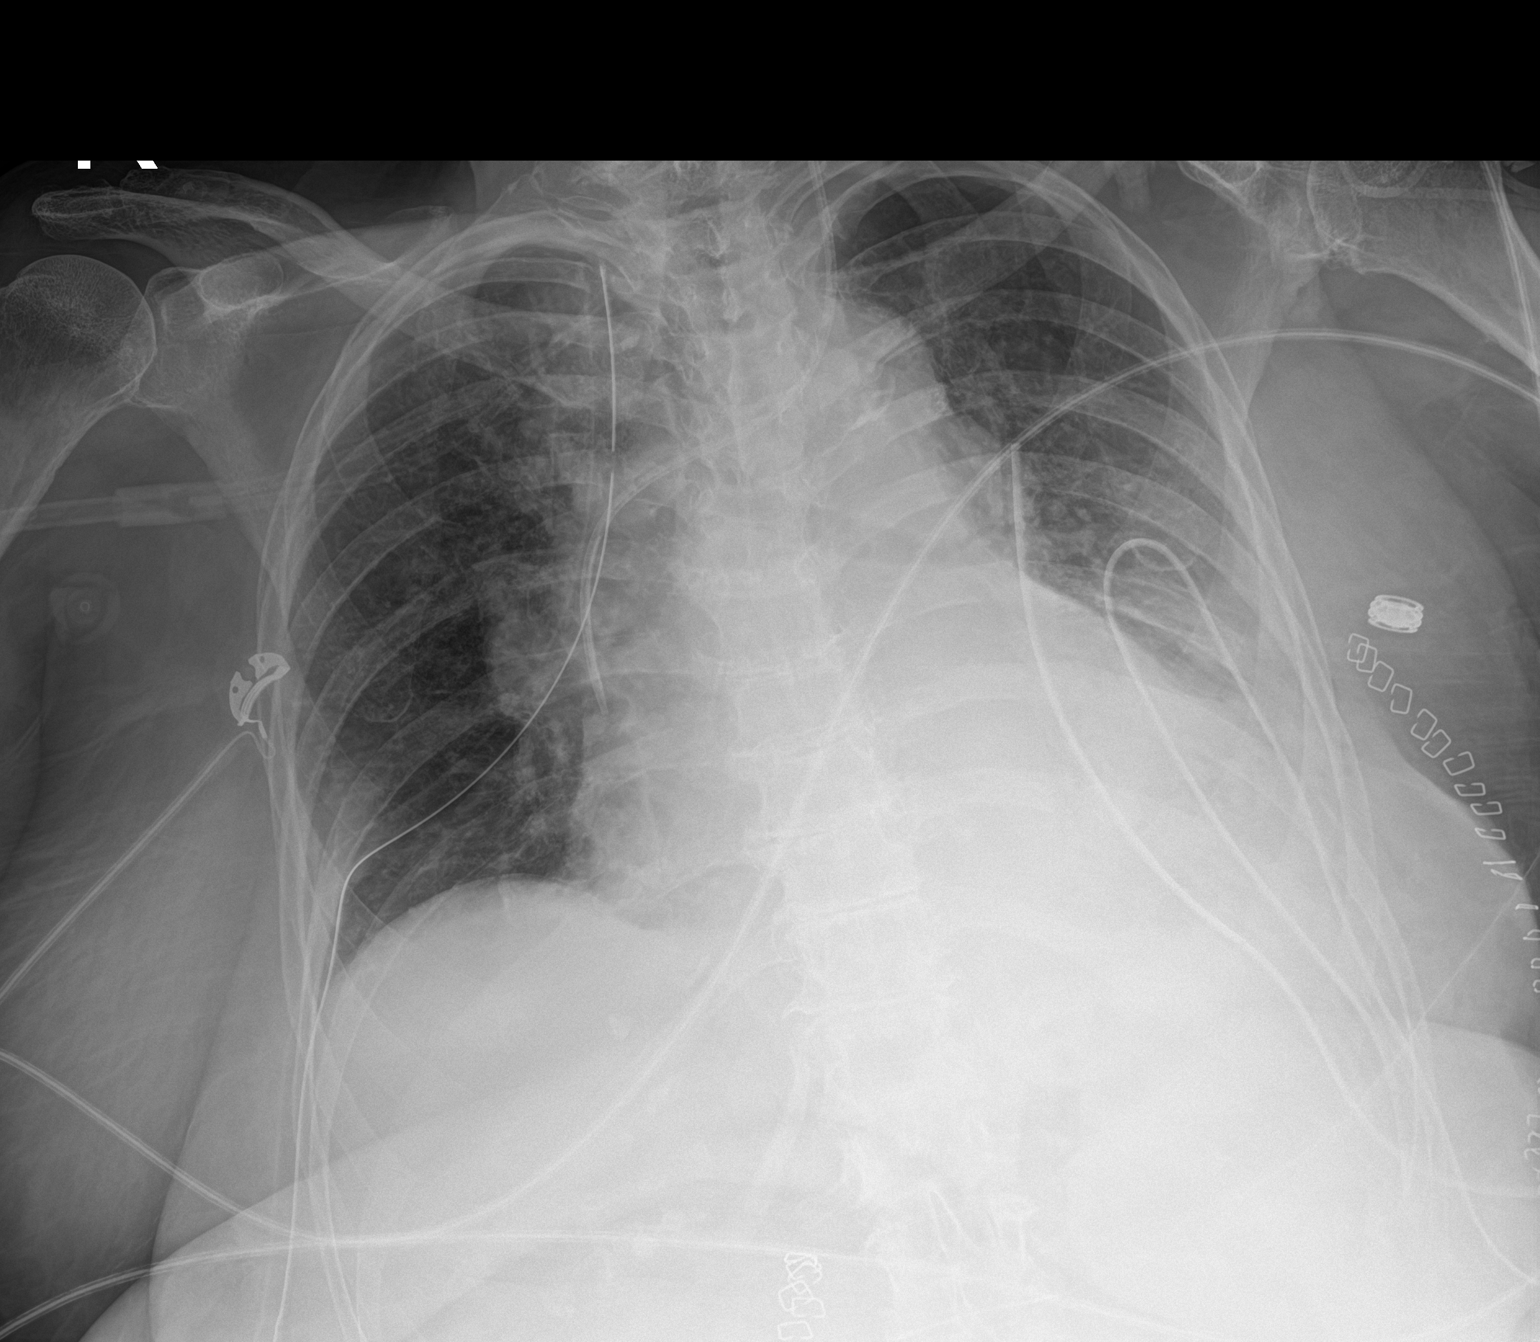

[1 of 1 positions shown; findings below may reference images not displayed]

FINDINGS: Bilateral chest tubes in place. No pneumothorax is identified. Left
IJ central venous catheter is stable.

No persistent left-sided pleural effusion is identified. There is
moderate left lower lobe atelectasis.

Stable portal cardiac enlargement.
IMPRESSION: 1. Bilateral chest tubes in place without pneumothorax.
2. Persistent left lower lobe atelectasis.
